# Patient Record
Sex: Female | Born: 1990 | Race: Black or African American | Hispanic: No | Marital: Single | Smoking: Never smoker
Health system: Southern US, Community
[De-identification: ages and names within clinical notes are randomized; demographics above are authoritative.]

## PROBLEM LIST (undated history)

## (undated) ENCOUNTER — Emergency Department: Discharge status: Absent without leave | Payer: Medicare Other

## (undated) DIAGNOSIS — I10 Essential (primary) hypertension: Secondary | ICD-10-CM

## (undated) DIAGNOSIS — Z992 Dependence on renal dialysis: Secondary | ICD-10-CM

## (undated) DIAGNOSIS — K219 Gastro-esophageal reflux disease without esophagitis: Secondary | ICD-10-CM

## (undated) DIAGNOSIS — N2581 Secondary hyperparathyroidism of renal origin: Secondary | ICD-10-CM

## (undated) DIAGNOSIS — I2699 Other pulmonary embolism without acute cor pulmonale: Secondary | ICD-10-CM

## (undated) DIAGNOSIS — N186 End stage renal disease: Secondary | ICD-10-CM

## (undated) DIAGNOSIS — N051 Unspecified nephritic syndrome with focal and segmental glomerular lesions: Secondary | ICD-10-CM

## (undated) DIAGNOSIS — E785 Hyperlipidemia, unspecified: Secondary | ICD-10-CM

## (undated) DIAGNOSIS — R6510 Systemic inflammatory response syndrome (SIRS) of non-infectious origin without acute organ dysfunction: Secondary | ICD-10-CM

## (undated) DIAGNOSIS — D649 Anemia, unspecified: Secondary | ICD-10-CM

## (undated) HISTORY — DX: Anemia, unspecified: D64.9

## (undated) HISTORY — PX: INSERTION OF DIALYSIS CATHETER: SHX1324

## (undated) HISTORY — PX: COLON SURGERY: SHX602

---
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
## 2015-02-19 ENCOUNTER — Encounter: Payer: Self-pay | Admitting: Vascular Surgery

## 2015-02-19 ENCOUNTER — Other Ambulatory Visit: Payer: Self-pay

## 2015-02-19 DIAGNOSIS — N186 End stage renal disease: Secondary | ICD-10-CM

## 2015-02-19 DIAGNOSIS — Z01810 Encounter for preprocedural cardiovascular examination: Secondary | ICD-10-CM

## 2015-03-13 ENCOUNTER — Encounter: Payer: Self-pay | Admitting: Vascular Surgery

## 2015-03-15 ENCOUNTER — Encounter (HOSPITAL_COMMUNITY): Payer: Self-pay

## 2015-03-15 ENCOUNTER — Other Ambulatory Visit (HOSPITAL_COMMUNITY): Payer: Self-pay

## 2015-03-15 ENCOUNTER — Ambulatory Visit: Payer: Self-pay | Admitting: Vascular Surgery

## 2015-03-19 ENCOUNTER — Encounter: Payer: Self-pay | Admitting: Vascular Surgery

## 2015-03-21 ENCOUNTER — Ambulatory Visit (HOSPITAL_COMMUNITY)
Admission: RE | Admit: 2015-03-21 | Discharge: 2015-03-21 | Disposition: A | Discharge status: Home / Self Care | Payer: Medicare Other | Source: Ambulatory Visit | Attending: Vascular Surgery | Admitting: Vascular Surgery

## 2015-03-21 ENCOUNTER — Ambulatory Visit (INDEPENDENT_AMBULATORY_CARE_PROVIDER_SITE_OTHER): Payer: Medicare Other | Admitting: Vascular Surgery

## 2015-03-21 ENCOUNTER — Encounter: Payer: Self-pay | Admitting: Vascular Surgery

## 2015-03-21 ENCOUNTER — Ambulatory Visit (INDEPENDENT_AMBULATORY_CARE_PROVIDER_SITE_OTHER)
Admission: RE | Admit: 2015-03-21 | Discharge: 2015-03-21 | Disposition: A | Discharge status: Home / Self Care | Payer: Medicare Other | Source: Ambulatory Visit | Attending: Vascular Surgery | Admitting: Vascular Surgery

## 2015-03-21 VITALS — BP 100/71 | HR 72 | Temp 98.1°F | Resp 16 | Ht 62.0 in | Wt 107.0 lb

## 2015-03-21 DIAGNOSIS — N186 End stage renal disease: Secondary | ICD-10-CM | POA: Diagnosis not present

## 2015-03-21 DIAGNOSIS — Z01810 Encounter for preprocedural cardiovascular examination: Secondary | ICD-10-CM

## 2015-03-21 DIAGNOSIS — Z992 Dependence on renal dialysis: Secondary | ICD-10-CM

## 2015-03-21 NOTE — Progress Notes (Signed)
[facility] [place] HISTORY AND PHYSICAL   History of Present Illness:  Patient is a 24 y.o. year old female who presents for placement of a permanent hemodialysis access. The patient is left handed.  The patient is currently on hemodialysis. She has been on dialysis since age 72. She apparently had some type of attempt to the fistula in the left arm in the past but this failed. 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  Other chronic medical problems include anemia which is stable. She currently dialyzes via a left-sided catheter. She states she has also had a previous left arm DVT. She is on Coumadin chronically for this.  Past Medical History  Diagnosis Date  . Anemia   . Chronic kidney disease Feb. 12, 2010    First date of Dialysis     Social History Social History  Substance Use Topics  . Smoking status: Never Smoker   . Smokeless tobacco: Never Used  . Alcohol Use: No    Family History History reviewed. No pertinent family history.  Allergies  Allergies  Allergen Reactions  . Cefazolin   . Cyclosporine   . Lactose Intolerance (Gi)   . Latex   . Vancomycin      Current Outpatient Prescriptions  Medication Sig Dispense Refill  . calcium acetate (PHOSLO) 667 MG capsule Take by mouth 3 (three) times daily with meals.    . calcium carbonate (TUMS - DOSED IN MG ELEMENTAL CALCIUM) 500 MG chewable tablet Chew 1 tablet by mouth daily.    . carvedilol (COREG) 25 MG tablet Take 25 mg by mouth 2 (two) times daily with a meal.    . cloNIDine HCl (KAPVAY) 0.1 MG TB12 ER tablet Take by mouth.    . folic acid (FOLVITE) 1 MG tablet Take 1 mg by mouth daily.    . multivitamin (RENA-VIT) TABS tablet Take 1 tablet by mouth daily.  11  . ranitidine (ZANTAC) 300 MG capsule Take 300 mg by mouth every evening.    . rosuvastatin (CRESTOR) 10 MG tablet Take 10 mg by mouth daily.    Marland Kitchen warfarin (COUMADIN) 5 MG tablet Take 5 mg by mouth  daily.     No current facility-administered medications for this visit.    ROS:   General:  No weight loss, Fever, chills  HEENT: No recent headaches, no nasal bleeding, no visual changes, no sore throat  Neurologic: No dizziness, blackouts, seizures. No recent symptoms of stroke or mini- stroke. No recent episodes of slurred speech, or temporary blindness.  Cardiac: No recent episodes of chest pain/pressure, no shortness of breath at rest.  No shortness of breath with exertion.  Denies history of atrial fibrillation or irregular heartbeat  Vascular: No history of rest pain in feet.  No history of claudication.  No history of non-healing ulcer, No history of DVT   Pulmonary: No home oxygen, no productive cough, no hemoptysis,  No asthma or wheezing  Musculoskeletal:  [ ]  Arthritis, [ ]  Low back pain,  [ ]  Joint pain  Hematologic:No history of hypercoagulable state.  No history of easy bleeding.  No history of anemia  Gastrointestinal: No hematochezia or melena,  No gastroesophageal reflux, no trouble swallowing  Urinary: [ ]  chronic Kidney disease, [ ]  on HD - [ ]  MWF or [ ]  TTHS, [ ]  Burning with urination, [ ]  Frequent urination, [ ]  Difficulty urinating;   Skin: No rashes  Psychological: No history of  anxiety,  No history of depression   Physical Examination  Filed Vitals:   03/21/15 1138  BP: 100/71  Pulse: 72  Temp: 98.1 F (36.7 C)  TempSrc: Oral  Resp: 16  Height: 5\' 2"  (1.575 m)  Weight: 107 lb (48.535 kg)  SpO2: 100%    Body mass index is 19.57 kg/(m^2).  General:  Alert and oriented, no acute distress HEENT: Normal Neck: No bruit or JVD Pulmonary: Clear to auscultation bilaterally Cardiac: Regular Rate and Rhythm without murmur Gastrointestinal: Soft, non-tender, non-distended Skin: No rash Extremity Pulses:  2+ radial, brachial pulses bilaterally Musculoskeletal: No deformity or edema  Neurologic: Upper and lower extremity motor 5/5 and  symmetric  DATA: Patient had a vein mapping ultrasound today as well as an upper extremity arterial duplex. This showed and unusable cephalic vein bilaterally. She had a borderline basilic vein on the right side. This was 3 mm in diameter but fairly short in length. The basilic vein on the left side was fairly uniformly small. 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   ASSESSMENT: Patient needs long-term hemodialysis access. The best vein is in her right upper extremity. She has also had a previous DVT in the left arm so most likely we should avoid this. Her brachial artery is fairly small bilaterally also.   PLAN: Most likely the best option for this patient would be a right upper arm AV graft. However, she is reluctant to have a permanent access placed at this time. I discussed with her the potential risks and complications and infection associated with catheters. She also understands that this may exclude her from transplant she develops an infection. She will call me if she was to have a right upper arm graft placed at some point future.  Ruta Hinds, MD [facility] Office: [phone] Pager: [phone]

## 2015-04-03 ENCOUNTER — Encounter: Payer: Self-pay | Admitting: Nephrology

## 2015-08-09 ENCOUNTER — Inpatient Hospital Stay (HOSPITAL_BASED_OUTPATIENT_CLINIC_OR_DEPARTMENT_OTHER): Payer: Medicare Other

## 2015-08-09 ENCOUNTER — Encounter (HOSPITAL_COMMUNITY): Payer: Self-pay | Admitting: Pulmonary Disease

## 2015-08-09 ENCOUNTER — Inpatient Hospital Stay (HOSPITAL_BASED_OUTPATIENT_CLINIC_OR_DEPARTMENT_OTHER)
Admission: EM | Admit: 2015-08-09 | Discharge: 2015-08-15 | DRG: 915 | Disposition: A | Discharge status: Home / Self Care | Payer: Medicare Other | Attending: Internal Medicine | Admitting: Internal Medicine

## 2015-08-09 DIAGNOSIS — T783XXA Angioneurotic edema, initial encounter: Secondary | ICD-10-CM | POA: Diagnosis not present

## 2015-08-09 DIAGNOSIS — Z681 Body mass index (BMI) 19 or less, adult: Secondary | ICD-10-CM | POA: Diagnosis not present

## 2015-08-09 DIAGNOSIS — I120 Hypertensive chronic kidney disease with stage 5 chronic kidney disease or end stage renal disease: Secondary | ICD-10-CM | POA: Diagnosis present

## 2015-08-09 DIAGNOSIS — R0600 Dyspnea, unspecified: Secondary | ICD-10-CM

## 2015-08-09 DIAGNOSIS — R739 Hyperglycemia, unspecified: Secondary | ICD-10-CM | POA: Diagnosis present

## 2015-08-09 DIAGNOSIS — D631 Anemia in chronic kidney disease: Secondary | ICD-10-CM | POA: Diagnosis present

## 2015-08-09 DIAGNOSIS — Z86711 Personal history of pulmonary embolism: Secondary | ICD-10-CM

## 2015-08-09 DIAGNOSIS — T782XXD Anaphylactic shock, unspecified, subsequent encounter: Secondary | ICD-10-CM | POA: Diagnosis not present

## 2015-08-09 DIAGNOSIS — T783XXD Angioneurotic edema, subsequent encounter: Secondary | ICD-10-CM | POA: Diagnosis not present

## 2015-08-09 DIAGNOSIS — T454X5A Adverse effect of iron and its compounds, initial encounter: Secondary | ICD-10-CM | POA: Diagnosis present

## 2015-08-09 DIAGNOSIS — T8612 Kidney transplant failure: Secondary | ICD-10-CM | POA: Diagnosis present

## 2015-08-09 DIAGNOSIS — T8089XA Other complications following infusion, transfusion and therapeutic injection, initial encounter: Secondary | ICD-10-CM | POA: Diagnosis present

## 2015-08-09 DIAGNOSIS — D509 Iron deficiency anemia, unspecified: Secondary | ICD-10-CM | POA: Diagnosis present

## 2015-08-09 DIAGNOSIS — T380X5A Adverse effect of glucocorticoids and synthetic analogues, initial encounter: Secondary | ICD-10-CM | POA: Diagnosis present

## 2015-08-09 DIAGNOSIS — N2581 Secondary hyperparathyroidism of renal origin: Secondary | ICD-10-CM | POA: Diagnosis present

## 2015-08-09 DIAGNOSIS — E785 Hyperlipidemia, unspecified: Secondary | ICD-10-CM | POA: Diagnosis present

## 2015-08-09 DIAGNOSIS — N186 End stage renal disease: Secondary | ICD-10-CM | POA: Diagnosis not present

## 2015-08-09 DIAGNOSIS — E875 Hyperkalemia: Secondary | ICD-10-CM | POA: Diagnosis present

## 2015-08-09 DIAGNOSIS — Y848 Other medical procedures as the cause of abnormal reaction of the patient, or of later complication, without mention of misadventure at the time of the procedure: Secondary | ICD-10-CM | POA: Diagnosis present

## 2015-08-09 DIAGNOSIS — T7840XA Allergy, unspecified, initial encounter: Secondary | ICD-10-CM | POA: Insufficient documentation

## 2015-08-09 DIAGNOSIS — G9340 Encephalopathy, unspecified: Secondary | ICD-10-CM | POA: Diagnosis present

## 2015-08-09 DIAGNOSIS — K219 Gastro-esophageal reflux disease without esophagitis: Secondary | ICD-10-CM | POA: Diagnosis present

## 2015-08-09 DIAGNOSIS — I10 Essential (primary) hypertension: Secondary | ICD-10-CM | POA: Diagnosis not present

## 2015-08-09 DIAGNOSIS — T886XXA Anaphylactic reaction due to adverse effect of correct drug or medicament properly administered, initial encounter: Principal | ICD-10-CM | POA: Diagnosis present

## 2015-08-09 DIAGNOSIS — J9601 Acute respiratory failure with hypoxia: Secondary | ICD-10-CM | POA: Diagnosis present

## 2015-08-09 DIAGNOSIS — Z7901 Long term (current) use of anticoagulants: Secondary | ICD-10-CM

## 2015-08-09 DIAGNOSIS — T782XXA Anaphylactic shock, unspecified, initial encounter: Secondary | ICD-10-CM | POA: Diagnosis present

## 2015-08-09 DIAGNOSIS — J9690 Respiratory failure, unspecified, unspecified whether with hypoxia or hypercapnia: Secondary | ICD-10-CM

## 2015-08-09 DIAGNOSIS — Z992 Dependence on renal dialysis: Secondary | ICD-10-CM

## 2015-08-09 HISTORY — DX: Other pulmonary embolism without acute cor pulmonale: I26.99

## 2015-08-09 HISTORY — DX: Gastro-esophageal reflux disease without esophagitis: K21.9

## 2015-08-09 HISTORY — DX: Secondary hyperparathyroidism of renal origin: N25.81

## 2015-08-09 HISTORY — DX: Unspecified nephritic syndrome with focal and segmental glomerular lesions: N05.1

## 2015-08-09 HISTORY — DX: Hyperlipidemia, unspecified: E78.5

## 2015-08-09 HISTORY — DX: Essential (primary) hypertension: I10

## 2015-08-09 LAB — COMPREHENSIVE METABOLIC PANEL
ALT: 23 U/L (ref 14–54)
AST: 30 U/L (ref 15–41)
Albumin: 4.3 g/dL (ref 3.5–5.0)
Alkaline Phosphatase: 127 U/L — ABNORMAL HIGH (ref 38–126)
Anion gap: 14 (ref 5–15)
BILIRUBIN TOTAL: 0.5 mg/dL (ref 0.3–1.2)
BUN: 21 mg/dL — AB (ref 6–20)
CALCIUM: 7.9 mg/dL — AB (ref 8.9–10.3)
CO2: 24 mmol/L (ref 22–32)
CREATININE: 6.07 mg/dL — AB (ref 0.44–1.00)
Chloride: 103 mmol/L (ref 101–111)
GFR calc Af Amer: 10 mL/min — ABNORMAL LOW (ref >60)
GFR, EST NON AFRICAN AMERICAN: 9 mL/min — AB (ref >60)
Glucose, Bld: 189 mg/dL — ABNORMAL HIGH (ref 65–99)
Potassium: 3.4 mmol/L — ABNORMAL LOW (ref 3.5–5.1)
Sodium: 141 mmol/L (ref 135–145)
TOTAL PROTEIN: 8.3 g/dL — AB (ref 6.5–8.1)

## 2015-08-09 LAB — POCT I-STAT 3, ART BLOOD GAS (G3+)
Acid-base deficit: 5 mmol/L — ABNORMAL HIGH (ref 0.0–2.0)
Bicarbonate: 21.2 mEq/L (ref 20.0–24.0)
O2 Saturation: 100 %
PCO2 ART: 41.8 mmHg (ref 35.0–45.0)
PH ART: 7.312 — AB (ref 7.350–7.450)
Patient temperature: 98.6
TCO2: 22 mmol/L (ref 0–100)
pO2, Arterial: 284 mmHg — ABNORMAL HIGH (ref 80.0–100.0)

## 2015-08-09 LAB — GLUCOSE, CAPILLARY
GLUCOSE-CAPILLARY: 221 mg/dL — AB (ref 65–99)
GLUCOSE-CAPILLARY: 176 mg/dL — AB (ref 65–99)
Glucose-Capillary: 111 mg/dL — ABNORMAL HIGH (ref 65–99)

## 2015-08-09 LAB — CBC WITH DIFFERENTIAL/PLATELET
BASOS ABS: 0 10*3/uL (ref 0.0–0.1)
BASOS PCT: 0 %
EOS ABS: 0.1 10*3/uL (ref 0.0–0.7)
EOS PCT: 1 %
HCT: 39.2 % (ref 36.0–46.0)
Hemoglobin: 12.2 g/dL (ref 12.0–15.0)
Lymphocytes Relative: 11 %
Lymphs Abs: 0.9 10*3/uL (ref 0.7–4.0)
MCH: 32.2 pg (ref 26.0–34.0)
MCHC: 31.1 g/dL (ref 30.0–36.0)
MCV: 103.4 fL — AB (ref 78.0–100.0)
MONO ABS: 0.2 10*3/uL (ref 0.1–1.0)
Monocytes Relative: 2 %
Neutro Abs: 7.1 10*3/uL (ref 1.7–7.7)
Neutrophils Relative %: 86 %
PLATELETS: 204 10*3/uL (ref 150–400)
RBC: 3.79 MIL/uL — ABNORMAL LOW (ref 3.87–5.11)
RDW: 16.1 % — AB (ref 11.5–15.5)
WBC: 8.3 10*3/uL (ref 4.0–10.5)

## 2015-08-09 LAB — MAGNESIUM: MAGNESIUM: 1.9 mg/dL (ref 1.7–2.4)

## 2015-08-09 LAB — PROTIME-INR
INR: 1.28 (ref 0.00–1.49)
Prothrombin Time: 16.2 seconds — ABNORMAL HIGH (ref 11.6–15.2)

## 2015-08-09 LAB — MRSA PCR SCREENING: MRSA by PCR: NEGATIVE

## 2015-08-09 MED ORDER — EPINEPHRINE HCL 1 MG/ML IJ SOLN
INTRAMUSCULAR | Status: AC
  Filled 2015-08-09: qty 1

## 2015-08-09 MED ORDER — EPINEPHRINE 0.3 MG/0.3ML IJ SOAJ: 0.3000 mg | Freq: Once | INTRAMUSCULAR | Status: DC

## 2015-08-09 MED ORDER — PROPOFOL 1000 MG/100ML IV EMUL
INTRAVENOUS | Status: AC
  Filled 2015-08-09: qty 100

## 2015-08-09 MED ORDER — DIPHENHYDRAMINE HCL 50 MG/ML IJ SOLN
50.0000 mg | Freq: Once | INTRAMUSCULAR | Status: AC
  Administered 2015-08-09: 50 mg via INTRAVENOUS

## 2015-08-09 MED ORDER — ETOMIDATE 2 MG/ML IV SOLN
20.0000 mg | Freq: Once | INTRAVENOUS | Status: AC
  Administered 2015-08-09: 20 mg via INTRAVENOUS

## 2015-08-09 MED ORDER — EPINEPHRINE HCL 0.1 MG/ML IJ SOSY: 0.3000 mg | PREFILLED_SYRINGE | Freq: Once | INTRAMUSCULAR | Status: DC

## 2015-08-09 MED ORDER — PROPOFOL BOLUS VIA INFUSION
1.0000 mg/kg | Freq: Once | INTRAVENOUS | Status: AC
  Administered 2015-08-09: 42.2 mg via INTRAVENOUS
  Filled 2015-08-09: qty 43

## 2015-08-09 MED ORDER — HEPARIN SODIUM (PORCINE) 1000 UNIT/ML IJ SOLN
4000.0000 [IU] | Freq: Once | INTRAMUSCULAR | Status: AC
  Administered 2015-08-09: 4000 [IU] via INTRAVENOUS
  Filled 2015-08-09: qty 4

## 2015-08-09 MED ORDER — PROPOFOL 1000 MG/100ML IV EMUL
5.0000 ug/kg/min | Freq: Once | INTRAVENOUS | Status: AC
  Administered 2015-08-09: 5 ug/kg/min via INTRAVENOUS

## 2015-08-09 MED ORDER — ROCURONIUM BROMIDE 50 MG/5ML IV SOLN
0.6000 mg/kg | Freq: Once | INTRAVENOUS | Status: AC
  Administered 2015-08-09: 25.3 mg via INTRAVENOUS

## 2015-08-09 MED ORDER — FENTANYL CITRATE (PF) 100 MCG/2ML IJ SOLN
INTRAMUSCULAR | Status: AC
  Administered 2015-08-09: 50 ug
  Filled 2015-08-09: qty 2

## 2015-08-09 MED ORDER — WARFARIN - PHARMACIST DOSING INPATIENT
Freq: Every day | Status: DC
  Administered 2015-08-09 – 2015-08-13 (×3)

## 2015-08-09 MED ORDER — FAMOTIDINE IN NACL 20-0.9 MG/50ML-% IV SOLN
20.0000 mg | Freq: Once | INTRAVENOUS | Status: AC
  Administered 2015-08-09: 20 mg via INTRAVENOUS

## 2015-08-09 MED ORDER — FENTANYL CITRATE (PF) 100 MCG/2ML IJ SOLN
100.0000 ug | INTRAMUSCULAR | Status: DC | PRN
  Administered 2015-08-09 – 2015-08-10 (×6): 100 ug via INTRAVENOUS
  Administered 2015-08-10: 50 ug via INTRAVENOUS
  Administered 2015-08-10 (×4): 100 ug via INTRAVENOUS
  Administered 2015-08-11 (×3): 50 ug via INTRAVENOUS
  Administered 2015-08-11: 100 ug via INTRAVENOUS
  Administered 2015-08-11 (×2): 50 ug via INTRAVENOUS
  Administered 2015-08-12: 100 ug via INTRAVENOUS
  Filled 2015-08-09 (×16): qty 2

## 2015-08-09 MED ORDER — EPINEPHRINE HCL 1 MG/ML IJ SOLN
1.0000 ug/min | INTRAMUSCULAR | Status: DC
  Administered 2015-08-09: 1 ug/min via INTRAVENOUS

## 2015-08-09 MED ORDER — MIDAZOLAM HCL 2 MG/2ML IJ SOLN
2.0000 mg | INTRAMUSCULAR | Status: DC | PRN
  Administered 2015-08-09: 2 mg via INTRAVENOUS
  Filled 2015-08-09: qty 2

## 2015-08-09 MED ORDER — ETOMIDATE 2 MG/ML IV SOLN: 0.3000 mg/kg | Freq: Once | INTRAVENOUS | Status: DC

## 2015-08-09 MED ORDER — ANTISEPTIC ORAL RINSE SOLUTION (CORINZ)
7.0000 mL | OROMUCOSAL | Status: DC
  Administered 2015-08-09 – 2015-08-14 (×14): 7 mL via OROMUCOSAL

## 2015-08-09 MED ORDER — FENTANYL CITRATE (PF) 100 MCG/2ML IJ SOLN
INTRAMUSCULAR | Status: AC
  Administered 2015-08-09: 100 ug
  Filled 2015-08-09: qty 2

## 2015-08-09 MED ORDER — MIDAZOLAM HCL 2 MG/2ML IJ SOLN
2.0000 mg | INTRAMUSCULAR | Status: DC | PRN
  Administered 2015-08-09: 2 mg via INTRAVENOUS
  Filled 2015-08-09 (×2): qty 2

## 2015-08-09 MED ORDER — CHLORHEXIDINE GLUCONATE 0.12% ORAL RINSE (MEDLINE KIT)
15.0000 mL | Freq: Two times a day (BID) | OROMUCOSAL | Status: DC
  Administered 2015-08-09 – 2015-08-10 (×2): 15 mL via OROMUCOSAL

## 2015-08-09 MED ORDER — DIPHENHYDRAMINE HCL 25 MG PO CAPS: 50.0000 mg | ORAL_CAPSULE | Freq: Once | ORAL | Status: DC

## 2015-08-09 MED ORDER — EPINEPHRINE HCL 0.1 MG/ML IJ SOSY
0.3000 mg | PREFILLED_SYRINGE | Freq: Once | INTRAMUSCULAR | Status: AC
  Administered 2015-08-09: 0.3 mg via INTRAVENOUS

## 2015-08-09 MED ORDER — METHYLPREDNISOLONE SODIUM SUCC 40 MG IJ SOLR
40.0000 mg | Freq: Four times a day (QID) | INTRAMUSCULAR | Status: DC
  Administered 2015-08-09 – 2015-08-11 (×8): 40 mg via INTRAVENOUS
  Filled 2015-08-09 (×12): qty 1

## 2015-08-09 MED ORDER — SODIUM CHLORIDE 0.9 % IV SOLN: INTRAVENOUS | Status: DC

## 2015-08-09 MED ORDER — RACEPINEPHRINE HCL 2.25 % IN NEBU
INHALATION_SOLUTION | RESPIRATORY_TRACT | Status: AC
  Administered 2015-08-09: 11:00:00
  Filled 2015-08-09: qty 0.5

## 2015-08-09 MED ORDER — FAMOTIDINE IN NACL 20-0.9 MG/50ML-% IV SOLN
INTRAVENOUS | Status: AC
  Administered 2015-08-09: 20 mg via INTRAVENOUS
  Filled 2015-08-09: qty 50

## 2015-08-09 MED ORDER — ANTISEPTIC ORAL RINSE SOLUTION (CORINZ): 7.0000 mL | Freq: Four times a day (QID) | OROMUCOSAL | Status: DC

## 2015-08-09 MED ORDER — MIDAZOLAM HCL 10 MG/2ML IJ SOLN
INTRAMUSCULAR | Status: AC
  Administered 2015-08-09: 6 mg
  Filled 2015-08-09: qty 2

## 2015-08-09 MED ORDER — CHLORHEXIDINE GLUCONATE 0.12% ORAL RINSE (MEDLINE KIT): 15.0000 mL | Freq: Two times a day (BID) | OROMUCOSAL | Status: DC

## 2015-08-09 MED ORDER — METHYLPREDNISOLONE SODIUM SUCC 125 MG IJ SOLR
125.0000 mg | Freq: Once | INTRAMUSCULAR | Status: AC
  Administered 2015-08-09: 125 mg via INTRAVENOUS
  Filled 2015-08-09: qty 2

## 2015-08-09 MED ORDER — DIPHENHYDRAMINE HCL 50 MG/ML IJ SOLN
INTRAMUSCULAR | Status: AC
  Administered 2015-08-09: 50 mg via INTRAVENOUS
  Filled 2015-08-09: qty 1

## 2015-08-09 MED ORDER — WARFARIN SODIUM 7.5 MG PO TABS
7.5000 mg | ORAL_TABLET | Freq: Once | ORAL | Status: AC
  Administered 2015-08-09: 7.5 mg via ORAL
  Filled 2015-08-09: qty 1

## 2015-08-09 MED ORDER — FENTANYL CITRATE (PF) 100 MCG/2ML IJ SOLN
100.0000 ug | INTRAMUSCULAR | Status: DC | PRN
  Administered 2015-08-09: 100 ug via INTRAVENOUS
  Filled 2015-08-09: qty 2

## 2015-08-09 MED ORDER — INSULIN ASPART 100 UNIT/ML ~~LOC~~ SOLN
0.0000 [IU] | SUBCUTANEOUS | Status: DC
  Administered 2015-08-09: 2 [IU] via SUBCUTANEOUS

## 2015-08-09 MED ORDER — ACETAMINOPHEN 650 MG RE SUPP: 650.0000 mg | RECTAL | Status: DC | PRN

## 2015-08-09 MED ORDER — FAMOTIDINE IN NACL 20-0.9 MG/50ML-% IV SOLN
20.0000 mg | INTRAVENOUS | Status: DC
  Administered 2015-08-10 – 2015-08-13 (×4): 20 mg via INTRAVENOUS
  Filled 2015-08-09 (×8): qty 50

## 2015-08-09 MED ORDER — DIPHENHYDRAMINE HCL 50 MG/ML IJ SOLN
50.0000 mg | Freq: Four times a day (QID) | INTRAMUSCULAR | Status: DC
  Administered 2015-08-09 – 2015-08-10 (×4): 50 mg via INTRAVENOUS
  Filled 2015-08-09 (×8): qty 1

## 2015-08-09 MED ORDER — DEXTROSE 5 % IV SOLN
2.0000 ug/min | Freq: Once | INTRAVENOUS | Status: AC
  Administered 2015-08-09: 4 ug/min via INTRAVENOUS
  Filled 2015-08-09: qty 4

## 2015-08-09 MED ORDER — EPINEPHRINE HCL 1 MG/ML IJ SOLN
INTRAMUSCULAR | Status: AC
  Filled 2015-08-09: qty 4

## 2015-08-09 NOTE — Progress Notes (Signed)
ANTICOAGULATION CONSULT NOTE - Initial Consult  Pharmacy Consult for warfarin Indication: DVT  Allergies  Allergen Reactions  . Ferrlecit [Na Ferric Gluc Cplx In Sucrose] Anaphylaxis  . Cefazolin   . Cyclosporine     Loss of motor skills  . Lactose Intolerance (Gi)   . Latex   . Vancomycin Rash    Patient Measurements: Height: 5\' 3"  (160 cm) Weight: 110 lb 0.2 oz (49.9 kg) IBW/kg (Calculated) : 52.4   Vital Signs: Temp: 99.4 F (37.4 C) (02/10 1513) Temp Source: Oral (02/10 1513) BP: 107/87 mmHg (02/10 1602) Pulse Rate: 128 (02/10 1602)  Labs:  Recent Labs  08/09/15 1532  HGB 12.2  HCT 39.2  PLT 204  LABPROT 16.2*  INR 1.28  CREATININE 6.07*    Estimated Creatinine Clearance: 11.2 mL/min (by C-G formula based on Cr of 6.07).   Medical History: Past Medical History  Diagnosis Date  . Anemia   . ESRD (end stage renal disease) ([place]) Feb. 12, 2010    First date of Dialysis   . Secondary hyperparathyroidism ([place])   . Pulmonary emboli ([place])   . Hypertension   . GERD (gastroesophageal reflux disease)   . Hyperlipidemia   . Focal segmental glomerulosclerosis     Assessment: 67 YOF with ESRD on HD with history of DVT in UE. Intubated and unable to provide medication history at this time- mom stepped out of room prior to pharmacy technician arrived- we will continue to attempt to complete medication history. From our files and CareEverywhere, patient has been on warfarin since at least 02/2015. Her last documented dose of warfarin was 5mg  daily (from refill information from 07/11/2015 from [facility]). INR today low at 1.28. Hgb 12.2, plts 204.  Goal of Therapy:  INR 2-3 Monitor platelets by anticoagulation protocol: Yes   Plan:  -warfarin 7.5mg  po x1 tonight -daily INR -follow for s/s bleeding  Ja Pistole D. Naydeen Speirs, PharmD, BCPS Clinical Pharmacist Pager: [phone] 08/09/2015 5:02 PM

## 2015-08-09 NOTE — ED Notes (Signed)
Report given to 2100 rn, [facility] en route with patient to [facility]

## 2015-08-09 NOTE — ED Provider Notes (Signed)
CSN: [id number]     Arrival date & time 08/09/15  1110 History   First MD Initiated Contact with Patient 08/09/15 1212     Chief Complaint  Patient presents with  . Allergic Reaction     (Consider location/radiation/quality/duration/timing/severity/associated sxs/prior Treatment) HPI   Patient is a 25 year old female presenting in anaphylaxis. Patient Delivered here by EMS because she was too sick to travel any further. [facility] Patient had allergic rash/angioedema and starting at 1023 in the morning.  This is directly after receiving an iron infusion. Patient had an iron infusion on Wednesday and was very sick nausea vomiting. They tried again today and she became anaphylactic.  On arrival here patient's eyes were almost swollen shut, lips were swollen uvula swollen difficult to visualize back of her mouth. Patient was complaining of trouble breathing.   Patient had no itching.  Patient received Solu-Medrol prior to arrival  Past Medical History  Diagnosis Date  . Anemia   . Chronic kidney disease Feb. 12, 2010    First date of Dialysis    No past surgical history on file. No family history on file. Social History  Substance Use Topics  . Smoking status: Never Smoker   . Smokeless tobacco: Never Used  . Alcohol Use: No   OB History    No data available     Review of Systems  Unable to perform ROS: Acuity of condition  Respiratory: Positive for chest tightness.       Allergies  Cefazolin; Cyclosporine; Lactose intolerance (gi); Latex; and Vancomycin  Home Medications   Prior to Admission medications   Medication Sig Start Date End Date Taking? Authorizing Provider  calcium acetate (PHOSLO) 667 MG capsule Take by mouth 3 (three) times daily with meals.    Historical Provider, MD  calcium carbonate (TUMS - DOSED IN MG ELEMENTAL CALCIUM) 500 MG chewable tablet Chew 1 tablet by mouth daily.    Historical Provider, MD   carvedilol (COREG) 25 MG tablet Take 25 mg by mouth 2 (two) times daily with a meal.    Historical Provider, MD  cloNIDine HCl (KAPVAY) 0.1 MG TB12 ER tablet Take by mouth.    Historical Provider, MD  folic acid (FOLVITE) 1 MG tablet Take 1 mg by mouth daily.    Historical Provider, MD  multivitamin (RENA-VIT) TABS tablet Take 1 tablet by mouth daily. 02/12/15   Historical Provider, MD  ranitidine (ZANTAC) 300 MG capsule Take 300 mg by mouth every evening.    Historical Provider, MD  rosuvastatin (CRESTOR) 10 MG tablet Take 10 mg by mouth daily.    Historical Provider, MD  warfarin (COUMADIN) 5 MG tablet Take 5 mg by mouth daily.    Historical Provider, MD   BP 104/68 mmHg  Pulse 111  Resp 18  Wt 93 lb (42.185 kg)  SpO2 100% Physical Exam  Constitutional: She appears well-developed and well-nourished.  Patient with swollen face, swollen lips.  HENT:  Head: Normocephalic and atraumatic.  Uvula swollen. Soft palate swollen. Edematous. Throughout.  Upper and lower lips swollen.  Eyes: Conjunctivae are normal. Right eye exhibits no discharge.  Neck: Neck supple.  Cardiovascular:  Tachycardic.  Pulmonary/Chest: Effort normal. She has wheezes.  Port in left upper chest wall.  Abdominal: Soft. She exhibits no distension. There is no tenderness.  Old scarring present on abdomen.  Neurological: No cranial nerve deficit.  Skin: Skin is warm and dry. No rash noted. She is not diaphoretic.  No rash noted.  Nursing note and vitals reviewed.   ED Course  Procedures (including critical care time) Labs Review Labs Reviewed - No data to display  Imaging Review Dg Chest Portable 1 View  08/09/2015  CLINICAL DATA:  Nonresponsive Chronic kidney failure. EXAM: PORTABLE CHEST 1 VIEW COMPARISON:  None. FINDINGS: Nasogastric tube with the tip projecting over the body of the stomach. Dual-lumen central venous catheter with the tip projecting over the cavoatrial junction. Endotracheal tube with the  tip 2.9 cm above the carina. There is no focal parenchymal opacity. There is no pleural effusion or pneumothorax. The heart and mediastinal contours are unremarkable. The osseous structures are unremarkable. IMPRESSION: Nasogastric tube with the tip projecting over the body of the stomach. Dual-lumen central venous catheter with the tip projecting over the cavoatrial junction. Endotracheal tube with the tip 2.9 cm above the carina. Electronically Signed   By: Kathreen Devoid   On: 08/09/2015 12:40   I have personally reviewed and evaluated these images and lab results as part of my medical decision-making.   EKG Interpretation None      MDM   Final diagnoses:  Allergic reaction, initial encounter    Patient is a 25 year old female presenting with facial swelling, thought to be anaphylactic reaction to an iron infusion at dialysis. Patient arrived here at 11 AM. Gave her dose of epinephrine. Redosed Solu-Medrol (they were unsure whether it was actually given), gave famotidine. Initially thought she was improving. The patient complained of increasing trouble breathing. Initially when he looked in the back of her throat she had swollen uvula uvula that was separate from soft palate.  At this point medical care was called. Transport was called.  When patient became worse she was breathing through swollen lips, was completely closed, uvula was on different trouble from the last of the soft palate. Patient became stridorous. She became panicked.  At that point intubation was set up. Crit kit was ready. Decision made to do non-paralytic intubation given the difficulty anticipated of the airway. Initially tried just midazolam. This wasn't effective. Etomidate given. 0.5 cm of airway seen with glycdescopee. Unable to pass tube because of angle. Patient dropped sats, bagged up again and an Additional effort made with a glydescope again. Unable now to visualize any of the cords. Bulbous edematous ariway seen.  Patient had completely edematous swollen airway, unable to see any portion of the airway. Direct laryngoscopy used and tube placed underneath swelling that was overlying airway angled up until breath sounds are heard through tube.  Positive color change. Bilateral breath sounds. Patient was intubated with a 6-0 tube.  Proprofol bolus given so airway could be maintained.   Pressure dropped to the mid 95K systolic. Prop stopped.  Epinephrine increase. Levophed started for transport.   Discussed with crit care.   CRITICAL CARE Performed by: Gardiner Sleeper Total critical care time: 90 minutes Critical care time was exclusive of separately billable procedures and treating other patients. Critical care was necessary to treat or prevent imminent or life-threatening deterioration. Critical care was time spent personally by me on the following activities: development of treatment plan with patient and/or surrogate as well as nursing, discussions with consultants, evaluation of patient's response to treatment, examination of patient, obtaining history from patient or surrogate, ordering and performing treatments and interventions, ordering and review of laboratory studies, ordering and review of radiographic studies, pulse oximetry and re-evaluation of patient's condition.     Yenty Bloch Julio Alm, MD 08/09/15 1320

## 2015-08-09 NOTE — ED Notes (Signed)
Pt at dialysis started having reaction at 1043, pt reacted to iron after 2nd dose of adminstered .3mg  given albuteral neb, consious alert and oriented during stragte

## 2015-08-09 NOTE — Progress Notes (Signed)
Warren Lacy MD notified of lab results.

## 2015-08-09 NOTE — Progress Notes (Signed)
UR Completed. Naziya Hegwood, RN, BSN.  [phone] 

## 2015-08-09 NOTE — Care Management Note (Signed)
Case Management Note  Patient Details  Name: Crystal Dunlap MRN: [id number] Date of Birth: [birth year]  Subjective/Objective:      Pt admitted with anaphylactic shock during HD treatment              Action/Plan:  Pt is independent from home with mom.   CM will continue to monitor   Expected Discharge Date:                  Expected Discharge Plan:  Home/Self Care  In-House Referral:     Discharge planning Services  CM Consult  Post Acute Care Choice:    Choice offered to:     DME Arranged:    DME Agency:     HH Arranged:    HH Agency:     Status of Service:  In process, will continue to follow  Medicare Important Message Given:    Date Medicare IM Given:    Medicare IM give by:    Date Additional Medicare IM Given:    Additional Medicare Important Message give by:     If discussed at [place] of Stay Meetings, dates discussed:    Additional Comments:  Maryclare Labrador, RN 08/09/2015, 2:31 PM

## 2015-08-09 NOTE — Progress Notes (Signed)
Received pt to [facility] from [facility] as a transfer from [facility]. Pt placed on vent with no complications. Pt stable at this time. RT will continue to monitor.

## 2015-08-09 NOTE — Progress Notes (Signed)
eLink Physician-Brief Progress Note Patient Name: Crystal Dunlap DOB: 12/20/1990 MRN: [id number]   Date of Service  08/09/2015  HPI/Events of Note  K+ = 3.4 and Creatinine = 6.07.   eICU Interventions  Will not replace K+ d/t renal function.      Intervention Category Major Interventions: Electrolyte abnormality - evaluation and management  [name] [place] 08/09/2015, 6:03 PM

## 2015-08-09 NOTE — ED Notes (Signed)
Critical care called --via carelink

## 2015-08-09 NOTE — H&P (Signed)
PULMONARY / CRITICAL CARE MEDICINE   Name: Crystal Dunlap MRN: [id number] DOB: 1991-02-27    ADMISSION DATE:  08/09/2015  REFERRING MD:  [facility]  CHIEF COMPLAINT:  Short of breath  HISTORY OF PRESENT ILLNESS:   25 yo female with hx of ESRD and iron deficiency anemia was given ferrlecit on 08/07/15.  Pt's mother reports that pt c/o swelling and feeling like throat was closing after HD on 2/08.  This resolved, and she did not have further complaints.  She had HD again on 2/10.  Mother thinks she completed HD.  She was given another dose of ferrlecit, and almost immediately developed tongue/lip swelling.  She was transferred to [facility].  She was intubated, and started on epinephrine gtt.  She was also given solumedrol, pepcid, She was transferred to [facility].  Her blood pressure was better, and she was weaned off epinephrine.  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  She is normally followed at [facility], and gets HD M/W/F.  PAST MEDICAL HISTORY :  She  has a past medical history of Anemia; ESRD (end stage renal disease) ([place]) (Feb. 12, 2010); Secondary hyperparathyroidism ([place]); Pulmonary emboli ([place]); Hypertension; GERD (gastroesophageal reflux disease); Hyperlipidemia; and Focal segmental glomerulosclerosis.  PAST SURGICAL HISTORY: She  has past surgical history that includes Kidney transplant.  Allergies  Allergen Reactions  . Ferrlecit [Na Ferric Gluc Cplx In Sucrose] Anaphylaxis  . Cefazolin   . Cyclosporine   . Lactose Intolerance (Gi)   . Latex   . Vancomycin     No current facility-administered medications on file prior to encounter.   Current Outpatient Prescriptions on File Prior to Encounter  Medication Sig  . calcium acetate (PHOSLO) 667 MG capsule Take by mouth 3 (three) times daily with meals.  . calcium carbonate (TUMS - DOSED IN MG ELEMENTAL CALCIUM) 500 MG  chewable tablet Chew 1 tablet by mouth daily.  . carvedilol (COREG) 25 MG tablet Take 25 mg by mouth 2 (two) times daily with a meal.  . cloNIDine HCl (KAPVAY) 0.1 MG TB12 ER tablet Take by mouth.  . folic acid (FOLVITE) 1 MG tablet Take 1 mg by mouth daily.  . multivitamin (RENA-VIT) TABS tablet Take 1 tablet by mouth daily.  . ranitidine (ZANTAC) 300 MG capsule Take 300 mg by mouth every evening.  . rosuvastatin (CRESTOR) 10 MG tablet Take 10 mg by mouth daily.  Marland Kitchen warfarin (COUMADIN) 5 MG tablet Take 5 mg by mouth daily.    FAMILY HISTORY:  Her indicated that her mother is alive. She indicated that her father is alive.   SOCIAL HISTORY: She  reports that she has never smoked. She has never used smokeless tobacco. She reports that she does not drink alcohol or use illicit drugs.  REVIEW OF SYSTEMS:   Unable to obtain due to pt being on vent and sedated.  SUBJECTIVE:  On vent  VITAL SIGNS: BP 128/93 mmHg  Pulse 101  Temp(Src) 97.9 F (36.6 C) (Oral)  Resp 21  Ht 5\' 3"  (1.6 m)  Wt 110 lb 0.2 oz (49.9 kg)  BMI 19.49 kg/m2  SpO2 100%  HEMODYNAMICS:    VENTILATOR SETTINGS: Vent Mode:  [-] PRVC FiO2 (%):  [100 %] 100 % Set Rate:  [18 bmp] 18 bmp Vt Set:  [360 mL] 360 mL PEEP:  [5 cmH20] 5 cmH20 Plateau Pressure:  [17 [id number]  cmH20] 17 cmH20  INTAKE / OUTPUT:    PHYSICAL EXAMINATION: General:  sedated Neuro:  RASS -3 HEENT:  ETT in place, swollen tongue/lips Cardiovascular:  Regular, tachycardic, HD cath in Lt upper chest Lungs: no wheeze Abdomen:  abd soft, non tender Musculoskeletal:  No edema Skin:  No hives  LABS:  BMET No results for input(s): NA, K, CL, CO2, BUN, CREATININE, GLUCOSE in the last 168 hours.  Electrolytes No results for input(s): CALCIUM, MG, PHOS in the last 168 hours.  CBC No results for input(s): WBC, HGB, HCT, PLT in the last 168 hours.  Coag's No results for input(s): APTT, INR in the last 168 hours.  Sepsis Markers No  results for input(s): LATICACIDVEN, PROCALCITON, O2SATVEN in the last 168 hours.  ABG  Recent Labs Lab 08/09/15 1356  PHART 7.312*  PCO2ART 41.8  PO2ART 284.0*    Liver Enzymes No results for input(s): AST, ALT, ALKPHOS, BILITOT, ALBUMIN in the last 168 hours.  Cardiac Enzymes No results for input(s): TROPONINI, PROBNP in the last 168 hours.  Glucose  Recent Labs Lab 08/09/15 1347  GLUCAP 221*    Imaging Dg Chest Portable 1 View  08/09/2015  CLINICAL DATA:  Nonresponsive Chronic kidney failure. EXAM: PORTABLE CHEST 1 VIEW COMPARISON:  None. FINDINGS: Nasogastric tube with the tip projecting over the body of the stomach. Dual-lumen central venous catheter with the tip projecting over the cavoatrial junction. Endotracheal tube with the tip 2.9 cm above the carina. There is no focal parenchymal opacity. There is no pleural effusion or pneumothorax. The heart and mediastinal contours are unremarkable. The osseous structures are unremarkable. IMPRESSION: Nasogastric tube with the tip projecting over the body of the stomach. Dual-lumen central venous catheter with the tip projecting over the cavoatrial junction. Endotracheal tube with the tip 2.9 cm above the carina. Electronically Signed   By: Kathreen Devoid   On: 08/09/2015 12:40     STUDIES:   CULTURES:  ANTIBIOTICS:  SIGNIFICANT EVENTS: 2/10 Admit  LINES/TUBES: 2/10 ETT >>  DISCUSSION: 25 yo female developed anaphylaxis with angioedema after receiving ferrlecit during HD for iron deficiency anemia.  She transiently required pressors, and required intubation.  ASSESSMENT / PLAN:  PULMONARY A: Acute hypoxic respiratory failure 2nd to anaphylaxis with angioedema. P:   Full vent support for now F/u CXR, ABG Solumedrol, pepcid, benadryl  CARDIOVASCULAR A:  Anaphylaxis >> weaned off pressors. Hx of HTN, HLD. 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 P:  IV fluid at 30 ml/hr Hold outpt coreg, clonidine, crestor Coumadin per  pharmacy  RENAL A:   Hx of focal segmental glomerulosclerosis with ESRD s/p failed renal transplant >> on HD M/W/F with [facility] outpt HD. P:   F/u BMET, electrolytes Might need to consult renal over weekend if she is not able to get extubated soon  GASTROINTESTINAL A:   Nutrition. P:   NPO >> tube feeds if not able to extubate soon Pepcid for SUP  HEMATOLOGIC A:   Iron deficiency anemia. P:  F/u CBC SCDs  INFECTIOUS A:   No evidence for infection. P:   Monitor clinically  ENDOCRINE A:   Steroid induced hyperglycemia.   P:   SSI while on steroids  NEUROLOGIC A:   Acute encephalopathy 2nd to anaphylaxis. P:   RASS goal: 0  Updated pt's mother at bedside.  CC time 38 minutes.  Chesley Mires, MD [facility] Pulmonary/Critical Care 08/09/2015, 3:06 PM Pager:  [phone] After 3pm call: [phone]

## 2015-08-09 NOTE — ED Notes (Signed)
Spoke with [place] [phone]), Report that pt received first dose Ferrlecit (Iron) on Wednesday. Later that evening at home after dialysis she felt nauseated and had low b/p. Received second dose Ferrlecit today (Friday) at 0851. Pt c/o throat swelling at 1020. At 1030 she received benadryl 25 mg IV, solumedrol 125 mg IV, and 0.3mg  epi SQ prior to EMS arrival.

## 2015-08-09 NOTE — ED Notes (Signed)
Portable xray--Stat

## 2015-08-10 ENCOUNTER — Inpatient Hospital Stay (HOSPITAL_COMMUNITY): Payer: Medicare Other

## 2015-08-10 DIAGNOSIS — J9600 Acute respiratory failure, unspecified whether with hypoxia or hypercapnia: Secondary | ICD-10-CM

## 2015-08-10 DIAGNOSIS — J9690 Respiratory failure, unspecified, unspecified whether with hypoxia or hypercapnia: Secondary | ICD-10-CM | POA: Insufficient documentation

## 2015-08-10 LAB — CBC
HEMATOCRIT: 33.8 % — AB (ref 36.0–46.0)
Hemoglobin: 10.8 g/dL — ABNORMAL LOW (ref 12.0–15.0)
MCH: 33.4 pg (ref 26.0–34.0)
MCHC: 32 g/dL (ref 30.0–36.0)
MCV: 104.6 fL — AB (ref 78.0–100.0)
PLATELETS: 185 10*3/uL (ref 150–400)
RBC: 3.23 MIL/uL — ABNORMAL LOW (ref 3.87–5.11)
RDW: 16.6 % — AB (ref 11.5–15.5)
WBC: 9.2 10*3/uL (ref 4.0–10.5)

## 2015-08-10 LAB — BASIC METABOLIC PANEL
Anion gap: 17 — ABNORMAL HIGH (ref 5–15)
BUN: 47 mg/dL — AB (ref 6–20)
CHLORIDE: 104 mmol/L (ref 101–111)
CO2: 18 mmol/L — ABNORMAL LOW (ref 22–32)
CREATININE: 9.13 mg/dL — AB (ref 0.44–1.00)
Calcium: 7.5 mg/dL — ABNORMAL LOW (ref 8.9–10.3)
GFR calc Af Amer: 6 mL/min — ABNORMAL LOW (ref >60)
GFR calc non Af Amer: 5 mL/min — ABNORMAL LOW (ref >60)
GLUCOSE: 114 mg/dL — AB (ref 65–99)
POTASSIUM: 5.3 mmol/L — AB (ref 3.5–5.1)
Sodium: 139 mmol/L (ref 135–145)

## 2015-08-10 LAB — PROTIME-INR
INR: 1.56 — ABNORMAL HIGH (ref 0.00–1.49)
Prothrombin Time: 18.7 seconds — ABNORMAL HIGH (ref 11.6–15.2)

## 2015-08-10 LAB — HEPARIN LEVEL (UNFRACTIONATED): Heparin Unfractionated: 1.86 IU/mL — ABNORMAL HIGH (ref 0.30–0.70)

## 2015-08-10 LAB — GLUCOSE, CAPILLARY
GLUCOSE-CAPILLARY: 98 mg/dL (ref 65–99)
GLUCOSE-CAPILLARY: 103 mg/dL — AB (ref 65–99)
GLUCOSE-CAPILLARY: 95 mg/dL (ref 65–99)
Glucose-Capillary: 101 mg/dL — ABNORMAL HIGH (ref 65–99)
Glucose-Capillary: 113 mg/dL — ABNORMAL HIGH (ref 65–99)
Glucose-Capillary: 94 mg/dL (ref 65–99)

## 2015-08-10 MED ORDER — HEPARIN BOLUS VIA INFUSION
2500.0000 [IU] | Freq: Once | INTRAVENOUS | Status: AC
  Administered 2015-08-10: 2500 [IU] via INTRAVENOUS
  Filled 2015-08-10: qty 2500

## 2015-08-10 MED ORDER — HEPARIN (PORCINE) IN NACL 100-0.45 UNIT/ML-% IJ SOLN
800.0000 [IU]/h | INTRAMUSCULAR | Status: DC
  Administered 2015-08-10: 800 [IU]/h via INTRAVENOUS
  Filled 2015-08-10: qty 250

## 2015-08-10 MED ORDER — DIPHENHYDRAMINE HCL 50 MG/ML IJ SOLN
50.0000 mg | Freq: Two times a day (BID) | INTRAMUSCULAR | Status: AC
  Administered 2015-08-10 – 2015-08-11 (×2): 50 mg via INTRAVENOUS
  Filled 2015-08-10 (×2): qty 1

## 2015-08-10 MED ORDER — DIPHENHYDRAMINE HCL 50 MG/ML IJ SOLN
25.0000 mg | Freq: Four times a day (QID) | INTRAMUSCULAR | Status: DC | PRN
  Administered 2015-08-10 – 2015-08-11 (×2): 25 mg via INTRAVENOUS
  Filled 2015-08-10: qty 1

## 2015-08-10 MED ORDER — WARFARIN SODIUM 10 MG PO TABS
10.0000 mg | ORAL_TABLET | Freq: Once | ORAL | Status: AC
  Administered 2015-08-10: 10 mg via ORAL
  Filled 2015-08-10: qty 1

## 2015-08-10 NOTE — Progress Notes (Signed)
PULMONARY / CRITICAL CARE MEDICINE   Name: Crystal Dunlap MRN: [id number] DOB: 1991/02/24    ADMISSION DATE:  08/09/2015  REFERRING MD:  [facility]  CHIEF COMPLAINT:  Short of breath  HISTORY OF PRESENT ILLNESS:   25 yo female with hx of ESRD and iron deficiency anemia was given ferrlecit on 08/07/15.  Pt's mother reports that pt c/o swelling and feeling like throat was closing after HD on 2/08.  This resolved, and she did not have further complaints.  She had HD again on 2/10.  Mother thinks she completed HD.  She was given another dose of ferrlecit, and almost immediately developed tongue/lip swelling.  She was transferred to [facility].  She was intubated, and started on epinephrine gtt.  She was also given solumedrol, pepcid, She was transferred to [facility].  Her blood pressure was better, and she was weaned off epinephrine.  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  She is normally followed at [place] [facility], and gets HD M/W/F.  SUBJECTIVE:  On vent awake  VITAL SIGNS: BP 101/84 mmHg  Pulse 90  Temp(Src) 97.7 F (36.5 C) (Axillary)  Resp 17  Ht 5\' 3"  (1.6 m)  Wt 49.9 kg (110 lb 0.2 oz)  BMI 19.49 kg/m2  SpO2 99%  HEMODYNAMICS:    VENTILATOR SETTINGS: Vent Mode:  [-] PSV;CPAP FiO2 (%):  [40 %-100 %] 40 % Set Rate:  [18 bmp] 18 bmp Vt Set:  [360 mL] 360 mL PEEP:  [5 cmH20] 5 cmH20 Pressure Support:  [5 cmH20] 5 cmH20 Plateau Pressure:  [15 cmH20-19 cmH20] 15 cmH20  INTAKE / OUTPUT: I/O last 3 completed shifts: In: 579.4 [I.V.:519.4; NG/GT:60] Out: -   PHYSICAL EXAMINATION: General:  Sedated, FC Neuro:  RASS 0 HEENT:  ETT in place, swollen tongue/lips remains but is better Cardiovascular:  Regular, tachycardic, HD cath in Lt upper chest Lungs: CTA Abdomen:  abd soft, non tender Musculoskeletal:  No edema Skin:  No hives  LABS:  BMET  Recent Labs Lab  08/09/15 1532  NA 141  K 3.4*  CL 103  CO2 24  BUN 21*  CREATININE 6.07*  GLUCOSE 189*    Electrolytes  Recent Labs Lab 08/09/15 1532  CALCIUM 7.9*  MG 1.9    CBC  Recent Labs Lab 08/09/15 1532  WBC 8.3  HGB 12.2  HCT 39.2  PLT 204    Coag's  Recent Labs Lab 08/09/15 1532  INR 1.28    Sepsis Markers No results for input(s): LATICACIDVEN, PROCALCITON, O2SATVEN in the last 168 hours.  ABG  Recent Labs Lab 08/09/15 1356  PHART 7.312*  PCO2ART 41.8  PO2ART 284.0*    Liver Enzymes  Recent Labs Lab 08/09/15 1532  AST 30  ALT 23  ALKPHOS 127*  BILITOT 0.5  ALBUMIN 4.3    Cardiac Enzymes No results for input(s): TROPONINI, PROBNP in the last 168 hours.  Glucose  Recent Labs Lab 08/09/15 1347 08/09/15 1512 08/09/15 1938 08/09/15 2330 08/10/15 0343 08/10/15 0733  GLUCAP 221* 176* 111* 113* 98 103*    Imaging Dg Chest Port 1 View  08/10/2015  CLINICAL DATA:  Respiratory failure and shortness of breath EXAM: PORTABLE CHEST 1 VIEW COMPARISON:  Yesterday FINDINGS: Endotracheal tube tip between the clavicular heads and carina. The orogastric tube reaches the stomach at least. Dialysis catheter from the left tip at the upper right atrium. Small  pleural effusions. There is no edema, consolidation, or pneumothorax. Normal heart size. IMPRESSION: 1. Stable positioning of tubes and dialysis catheter. 2. Small pleural effusions. Electronically Signed   By: Monte Fantasia M.D.   On: 08/10/2015 07:35   Dg Chest Portable 1 View  08/09/2015  CLINICAL DATA:  Nonresponsive Chronic kidney failure. EXAM: PORTABLE CHEST 1 VIEW COMPARISON:  None. FINDINGS: Nasogastric tube with the tip projecting over the body of the stomach. Dual-lumen central venous catheter with the tip projecting over the cavoatrial junction. Endotracheal tube with the tip 2.9 cm above the carina. There is no focal parenchymal opacity. There is no pleural effusion or pneumothorax. The heart  and mediastinal contours are unremarkable. The osseous structures are unremarkable. IMPRESSION: Nasogastric tube with the tip projecting over the body of the stomach. Dual-lumen central venous catheter with the tip projecting over the cavoatrial junction. Endotracheal tube with the tip 2.9 cm above the carina. Electronically Signed   By: Kathreen Devoid   On: 08/09/2015 12:40     STUDIES:   CULTURES:  ANTIBIOTICS:  SIGNIFICANT EVENTS: 2/10 Admit  LINES/TUBES: 2/10 ETT >>  DISCUSSION: 25 yo female developed anaphylaxis with angioedema after receiving ferrlecit during HD for iron deficiency anemia.  She transiently required pressors, and required intubation.  ASSESSMENT / PLAN:  PULMONARY A: Acute hypoxic respiratory failure 2nd to anaphylaxis with angioedema. P:   Full vent support for now pcxr NOt with edema no asp Solumedrol, pepcid, benadryl, wold extend this Secretions remain Wean cpap 5 ps 5, goal 2 hr Assess leak Hd per renal, does not appear to need  CARDIOVASCULAR A:  Anaphylaxis >> weaned off pressors. Hx of HTN, HLD. 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 P:  IV fluid at 30 ml/hr, kvo Hold outpt coreg, clonidine, crestor Low threshold start clonidine as will rebound Coumadin per pharmacy  RENAL A:   Hx of focal segmental glomerulosclerosis with ESRD s/p failed renal transplant >> on HD M/W/F with [facility] outpt HD. P:   Renal panel in am  Repeat chem now  No edema , k wnl, kvo  GASTROINTESTINAL A:   Nutrition. P:   NPO >> tube feeds if not able to extubate today Pepcid for SUP  HEMATOLOGIC A:   Iron deficiency anemia, coumadin P:  F/u CBC SCDs  INFECTIOUS A:   No evidence for infection. P:   Monitor clinically  ENDOCRINE A:   Steroid induced hyperglycemia.   P:   SSI while on steroids  NEUROLOGIC A:   Acute encephalopathy 2nd to anaphylaxis. P:   RASS goal: 0 WUA mandatory  Updated pt's mother at bedside. By me  CC time 30  minutes.  Lavon Paganini. Titus Mould, MD, [place] Pgr: [facility]

## 2015-08-10 NOTE — Procedures (Signed)
Extubation Procedure Note  Patient Details:   Name: Crystal Dunlap DOB: Apr 11, 1991 MRN: [id number]   Airway Documentation:     Evaluation  O2 sats: stable throughout Complications: No apparent complications Patient did tolerate procedure well. Bilateral Breath Sounds: Clear Suctioning: Oral Yes   Patient extubated to 2L nasal cannula per MD order.  Positive cuff leak noted.  No evidence of stridor.  Patient able to speak post extubation.  Sats currently 98%.  Vitals are stable.  No apparent complications.    Philomena Doheny 08/10/2015, 11:35 AM

## 2015-08-10 NOTE — Progress Notes (Signed)
ANTICOAGULATION CONSULT NOTE - Consult  Pharmacy Consult for warfarin, adding heparin Indication: DVT  Allergies  Allergen Reactions  . Ferrlecit [Na Ferric Gluc Cplx In Sucrose] Anaphylaxis  . Cefazolin   . Cyclosporine     Loss of motor skills  . Lactose Intolerance (Gi)   . Latex   . Vancomycin Rash    Patient Measurements: Height: 5\' 3"  (160 cm) Weight: 110 lb 0.2 oz (49.9 kg) IBW/kg (Calculated) : 52.4   Vital Signs: Temp: 98.2 F (36.8 C) (02/11 1230) Temp Source: Oral (02/11 1230) BP: 101/84 mmHg (02/11 1000) Pulse Rate: 90 (02/11 1000)  Labs:  Recent Labs  08/09/15 1532  HGB 12.2  HCT 39.2  PLT 204  LABPROT 16.2*  INR 1.28  CREATININE 6.07*    Estimated Creatinine Clearance: 11.2 mL/min (by C-G formula based on Cr of 6.07).   Medical History: Past Medical History  Diagnosis Date  . Anemia   . ESRD (end stage renal disease) ([place]) Feb. 12, 2010    First date of Dialysis   . Secondary hyperparathyroidism ([place])   . Pulmonary emboli ([place])   . Hypertension   . GERD (gastroesophageal reflux disease)   . Hyperlipidemia   . 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  On warfarin 7 mg daily at home. Unsure of compliance with low INR. With hx of dvt will bridge patient with heparin until INR is therapeutic  Goal of Therapy:  INR 2-3 Heparin level 0.3-0.7 units/ml Monitor platelets by anticoagulation protocol: Yes   Plan:  Heparin 2500 unit bolus followed by an infusion of 800 units/hr Warfarin 10 mg x 1 tonight 2100 HL Daily HL, INR, CBC F/U D/C of heparin when INR within goal Monitor for s/sx of bleeding  Levester Fresh, PharmD, BCPS, [facility] Clinical Pharmacist Pager [phone] 08/10/2015 1:25 PM

## 2015-08-11 ENCOUNTER — Encounter (HOSPITAL_COMMUNITY): Payer: Self-pay

## 2015-08-11 LAB — GLUCOSE, CAPILLARY
GLUCOSE-CAPILLARY: 92 mg/dL (ref 65–99)
GLUCOSE-CAPILLARY: 108 mg/dL — AB (ref 65–99)
GLUCOSE-CAPILLARY: 94 mg/dL (ref 65–99)
GLUCOSE-CAPILLARY: 135 mg/dL — AB (ref 65–99)
Glucose-Capillary: 96 mg/dL (ref 65–99)

## 2015-08-11 LAB — RENAL FUNCTION PANEL
ALBUMIN: 4 g/dL (ref 3.5–5.0)
ANION GAP: 18 — AB (ref 5–15)
BUN: 65 mg/dL — ABNORMAL HIGH (ref 6–20)
CALCIUM: 6.4 mg/dL — AB (ref 8.9–10.3)
CO2: 18 mmol/L — AB (ref 22–32)
CREATININE: 10.17 mg/dL — AB (ref 0.44–1.00)
Chloride: 105 mmol/L (ref 101–111)
GFR calc non Af Amer: 5 mL/min — ABNORMAL LOW (ref >60)
GFR, EST AFRICAN AMERICAN: 5 mL/min — AB (ref >60)
GLUCOSE: 119 mg/dL — AB (ref 65–99)
PHOSPHORUS: 6.6 mg/dL — AB (ref 2.5–4.6)
Potassium: 6.2 mmol/L (ref 3.5–5.1)
SODIUM: 141 mmol/L (ref 135–145)

## 2015-08-11 LAB — CBC
HCT: 29.4 % — ABNORMAL LOW (ref 36.0–46.0)
HEMOGLOBIN: 9 g/dL — AB (ref 12.0–15.0)
MCH: 31.7 pg (ref 26.0–34.0)
MCHC: 30.6 g/dL (ref 30.0–36.0)
MCV: 103.5 fL — ABNORMAL HIGH (ref 78.0–100.0)
PLATELETS: 154 10*3/uL (ref 150–400)
RBC: 2.84 MIL/uL — AB (ref 3.87–5.11)
RDW: 16.6 % — ABNORMAL HIGH (ref 11.5–15.5)
WBC: 6.4 10*3/uL (ref 4.0–10.5)

## 2015-08-11 LAB — BASIC METABOLIC PANEL
Anion gap: 21 — ABNORMAL HIGH (ref 5–15)
BUN: 81 mg/dL — AB (ref 6–20)
CHLORIDE: 102 mmol/L (ref 101–111)
CO2: 17 mmol/L — ABNORMAL LOW (ref 22–32)
CREATININE: 11.72 mg/dL — AB (ref 0.44–1.00)
Calcium: 6.2 mg/dL — CL (ref 8.9–10.3)
GFR calc Af Amer: 5 mL/min — ABNORMAL LOW (ref >60)
GFR calc non Af Amer: 4 mL/min — ABNORMAL LOW (ref >60)
GLUCOSE: 143 mg/dL — AB (ref 65–99)
POTASSIUM: 5.5 mmol/L — AB (ref 3.5–5.1)
Sodium: 140 mmol/L (ref 135–145)

## 2015-08-11 LAB — PROTIME-INR
INR: 1.95 — ABNORMAL HIGH (ref 0.00–1.49)
PROTHROMBIN TIME: 22.1 s — AB (ref 11.6–15.2)

## 2015-08-11 LAB — HEPARIN LEVEL (UNFRACTIONATED): HEPARIN UNFRACTIONATED: 0.31 [IU]/mL (ref 0.30–0.70)

## 2015-08-11 MED ORDER — HEPARIN (PORCINE) IN NACL 100-0.45 UNIT/ML-% IJ SOLN
600.0000 [IU]/h | INTRAMUSCULAR | Status: DC
  Filled 2015-08-11: qty 250

## 2015-08-11 MED ORDER — SODIUM POLYSTYRENE SULFONATE 15 GM/60ML PO SUSP
15.0000 g | Freq: Once | ORAL | Status: AC
  Administered 2015-08-11: 15 g via ORAL
  Filled 2015-08-11: qty 60

## 2015-08-11 MED ORDER — KETOROLAC TROMETHAMINE 10 MG PO TABS
10.0000 mg | ORAL_TABLET | Freq: Three times a day (TID) | ORAL | Status: DC | PRN
  Administered 2015-08-12: 10 mg via ORAL
  Filled 2015-08-11 (×3): qty 1

## 2015-08-11 MED ORDER — PREDNISONE 10 MG PO TABS: 10.0000 mg | ORAL_TABLET | Freq: Every day | ORAL | Status: DC

## 2015-08-11 MED ORDER — PREDNISONE 20 MG PO TABS
20.0000 mg | ORAL_TABLET | Freq: Two times a day (BID) | ORAL | Status: AC
  Administered 2015-08-11 – 2015-08-13 (×4): 20 mg via ORAL
  Filled 2015-08-11 (×6): qty 1

## 2015-08-11 MED ORDER — PREDNISONE 10 MG PO TABS
10.0000 mg | ORAL_TABLET | Freq: Two times a day (BID) | ORAL | Status: AC
  Administered 2015-08-13 – 2015-08-15 (×4): 10 mg via ORAL
  Filled 2015-08-11 (×4): qty 1

## 2015-08-11 MED ORDER — SODIUM POLYSTYRENE SULFONATE 15 GM/60ML PO SUSP
30.0000 g | Freq: Once | ORAL | Status: AC
  Administered 2015-08-12: 30 g via ORAL
  Filled 2015-08-11: qty 120

## 2015-08-11 MED ORDER — WARFARIN SODIUM 10 MG PO TABS
10.0000 mg | ORAL_TABLET | Freq: Once | ORAL | Status: AC
  Administered 2015-08-11: 10 mg via ORAL
  Filled 2015-08-11 (×2): qty 1

## 2015-08-11 MED ORDER — CETYLPYRIDINIUM CHLORIDE 0.05 % MT LIQD
7.0000 mL | Freq: Two times a day (BID) | OROMUCOSAL | Status: DC
  Administered 2015-08-11 – 2015-08-14 (×4): 7 mL via OROMUCOSAL

## 2015-08-11 NOTE — Progress Notes (Signed)
ANTICOAGULATION CONSULT NOTE - Consult  Pharmacy Consult for heparin Indication: DVT  Allergies  Allergen Reactions  . Ferrlecit [Na Ferric Gluc Cplx In Sucrose] Anaphylaxis  . Cefazolin   . Cyclosporine     Loss of motor skills  . Lactose Intolerance (Gi)   . Latex   . Vancomycin Rash    Patient Measurements: Height: 5\' 3"  (160 cm) Weight: 110 lb 0.2 oz (49.9 kg) IBW/kg (Calculated) : 52.4   Vital Signs: Temp: 98.4 F (36.9 C) (02/11 2327) Temp Source: Oral (02/11 2327) BP: 111/89 mmHg (02/12 0000) Pulse Rate: 72 (02/12 0000)  Labs:  Recent Labs  08/09/15 1532 08/10/15 1401 08/10/15 2052  HGB 12.2 10.8*  --   HCT 39.2 33.8*  --   PLT 204 185  --   LABPROT 16.2* 18.7*  --   INR 1.28 1.56*  --   HEPARINUNFRC  --   --  1.86*  CREATININE 6.07* 9.13*  --     Estimated Creatinine Clearance: 7.4 mL/min (by C-G formula based on Cr of 9.13).   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 Pt on coumadin with subtherapeutic INR - now on heparin bridge. Heparin level 1.86 on initial check at 800 units/hr.Level drawn from same arm where heparin infusing - unable to obtain from other arm as it is restricted. RN paused heparin before lab drawn - heparin level still likely artificially elevated due to proximity to site where gtt infusing. CCM resident does not want to do footstick so we will adjust based on the elevated level that we have.  Goal of Therapy:  INR 2-3 Heparin level 0.3-0.7 units/ml Monitor platelets by anticoagulation protocol: Yes   Plan:  Hold heparin x 1 hour Restart heparin at 600 units/hr Will f/u 6 hour heparin level  Sherlon Handing, PharmD, BCPS Clinical pharmacist, pager [phone] 08/11/2015 12:23 AM

## 2015-08-11 NOTE — Consult Note (Signed)
[facility] Consult Note     Date: 08/11/2015                  Patient Name:  Crystal Dunlap  MRN: [id number]  DOB: 1990-11-24  Age / Sex: 25 y.o., female         PCP: No primary care provider on file.                 Service Requesting Consult: CCM Titus Mould)                 Reason for Consult: HD, hyperkalemia            Chief Complaint: SOB HPI: 25 yo female with hx of ESRD and iron deficiency anemia was given ferrlecit on 08/07/15. Pt's mother reports that pt c/o swelling and feeling like throat was closing after HD on 2/08. This resolved, and she did not have further complaints. She had HD again on 2/10. Mother thinks she completed HD. She was given another dose of ferrlecit, and almost immediately developed tongue/lip swelling. She was [facility]. She was intubated, and started on epinephrine gtt. She was also given solumedrol, pepcid, She was transferred to [facility]. Her blood pressure was better, and she was weaned off epinephrine.  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 She is normally followed at [facility] for outpt dialysis, and gets HD M/W/F in [facility].  She reports she no longer makes urine.  She is eating well. Reports she has had 2 fistula trials that have failed, so tunneled catheter is used for HD access.  Past Medical History  Diagnosis Date  . Anemia   . ESRD (end stage renal disease) ([place]) Feb. 12, 2010    First date of Dialysis   . Secondary hyperparathyroidism ([place])   . Pulmonary emboli ([place])   . Hypertension   . GERD (gastroesophageal reflux disease)   . Hyperlipidemia   . Focal segmental glomerulosclerosis     Past Surgical History  Procedure Laterality Date  . Kidney transplant      No family history on file. Social History:  reports that she has never smoked. She has never used smokeless tobacco. She  reports that she does not drink alcohol or use illicit drugs.  Allergies:  Allergies  Allergen Reactions  . Ferrlecit [Na Ferric Gluc Cplx In Sucrose] Anaphylaxis  . Cefazolin   . Cyclosporine     Loss of motor skills  . Lactose Intolerance (Gi)   . Latex   . Vancomycin Rash    Medications Prior to Admission  Medication Sig Dispense Refill  . calcium acetate (PHOSLO) 667 MG capsule Take 1,334 mg by mouth 3 (three) times daily with meals.     . carvedilol (COREG) 25 MG tablet Take 25 mg by mouth 2 (two) times daily with a meal.    . cloNIDine HCl (KAPVAY) 0.1 MG TB12 ER tablet Take 0.1 mg by mouth 2 (two) times daily.     . folic acid (FOLVITE) 1 MG tablet Take 1 mg by mouth daily.    . multivitamin (RENA-VIT) TABS tablet Take 1 tablet by mouth daily.  11  . ranitidine (ZANTAC) 300 MG capsule Take 300 mg by mouth every evening.    . rosuvastatin (CRESTOR) 10 MG tablet Take 10 mg by mouth daily.    Marland Kitchen warfarin (COUMADIN) 1 MG tablet Take 2  mg by mouth daily after supper.     . warfarin (COUMADIN) 5 MG tablet Take 5 mg by mouth daily after supper.       Results for orders placed or performed during the hospital encounter of 08/09/15 (from the past 48 hour(s))  MRSA PCR Screening     Status: None   Collection Time: 08/09/15  1:44 PM  Result Value Ref Range   MRSA by PCR NEGATIVE NEGATIVE    Comment:        The GeneXpert MRSA Assay (FDA approved for NASAL specimens only), is one component of a comprehensive MRSA colonization surveillance program. It is not intended to diagnose MRSA infection nor to guide or monitor treatment for MRSA infections.   Glucose, capillary     Status: Abnormal   Collection Time: 08/09/15  1:47 PM  Result Value Ref Range   Glucose-Capillary 221 (H) 65 - 99 mg/dL  I-STAT 3, arterial blood gas (G3+)     Status: Abnormal   Collection Time: 08/09/15  1:56 PM  Result Value Ref Range   pH, Arterial 7.312 (L) 7.350 - 7.450   pCO2 arterial 41.8 35.0 -  45.0 mmHg   pO2, Arterial 284.0 (H) 80.0 - 100.0 mmHg   Bicarbonate 21.2 20.0 - 24.0 mEq/L   TCO2 22 0 - 100 mmol/L   O2 Saturation 100.0 %   Acid-base deficit 5.0 (H) 0.0 - 2.0 mmol/L   Patient temperature 98.6 F    Collection site RADIAL, ALLEN'S TEST ACCEPTABLE    Drawn by RT    Sample type ARTERIAL   Glucose, capillary     Status: Abnormal   Collection Time: 08/09/15  3:12 PM  Result Value Ref Range   Glucose-Capillary 176 (H) 65 - 99 mg/dL  Comprehensive metabolic panel     Status: Abnormal   Collection Time: 08/09/15  3:32 PM  Result Value Ref Range   Sodium 141 135 - 145 mmol/L   Potassium 3.4 (L) 3.5 - 5.1 mmol/L   Chloride 103 101 - 111 mmol/L   CO2 24 22 - 32 mmol/L   Glucose, Bld 189 (H) 65 - 99 mg/dL   BUN 21 (H) 6 - 20 mg/dL   Creatinine, Ser 6.07 (H) 0.44 - 1.00 mg/dL   Calcium 7.9 (L) 8.9 - 10.3 mg/dL   Total Protein 8.3 (H) 6.5 - 8.1 g/dL   Albumin 4.3 3.5 - 5.0 g/dL   AST 30 15 - 41 U/L   ALT 23 14 - 54 U/L   Alkaline Phosphatase 127 (H) 38 - 126 U/L   Total Bilirubin 0.5 0.3 - 1.2 mg/dL   GFR calc non Af Amer 9 (L) >60 mL/min   GFR calc Af Amer 10 (L) >60 mL/min    Comment: (NOTE) The eGFR has been calculated using the CKD EPI equation. This calculation has not been validated in all clinical situations. eGFR's persistently <60 mL/min signify possible Chronic Kidney Disease.    Anion gap 14 5 - 15  Magnesium     Status: None   Collection Time: 08/09/15  3:32 PM  Result Value Ref Range   Magnesium 1.9 1.7 - 2.4 mg/dL  CBC with Differential/Platelet     Status: Abnormal   Collection Time: 08/09/15  3:32 PM  Result Value Ref Range   WBC 8.3 4.0 - 10.5 K/uL   RBC 3.79 (L) 3.87 - 5.11 MIL/uL   Hemoglobin 12.2 12.0 - 15.0 g/dL   HCT 39.2 36.0 - 46.0 %  MCV 103.4 (H) 78.0 - 100.0 fL   MCH 32.2 26.0 - 34.0 pg   MCHC 31.1 30.0 - 36.0 g/dL   RDW 16.1 (H) 11.5 - 15.5 %   Platelets 204 150 - 400 K/uL   Neutrophils Relative % 86 %   Neutro Abs 7.1 1.7 -  7.7 K/uL   Lymphocytes Relative 11 %   Lymphs Abs 0.9 0.7 - 4.0 K/uL   Monocytes Relative 2 %   Monocytes Absolute 0.2 0.1 - 1.0 K/uL   Eosinophils Relative 1 %   Eosinophils Absolute 0.1 0.0 - 0.7 K/uL   Basophils Relative 0 %   Basophils Absolute 0.0 0.0 - 0.1 K/uL  Protime-INR     Status: Abnormal   Collection Time: 08/09/15  3:32 PM  Result Value Ref Range   Prothrombin Time 16.2 (H) 11.6 - 15.2 seconds   INR 1.28 0.00 - 1.49  Glucose, capillary     Status: Abnormal   Collection Time: 08/09/15  7:38 PM  Result Value Ref Range   Glucose-Capillary 111 (H) 65 - 99 mg/dL  Glucose, capillary     Status: Abnormal   Collection Time: 08/09/15 11:30 PM  Result Value Ref Range   Glucose-Capillary 113 (H) 65 - 99 mg/dL  Glucose, capillary     Status: None   Collection Time: 08/10/15  3:43 AM  Result Value Ref Range   Glucose-Capillary 98 65 - 99 mg/dL  Glucose, capillary     Status: Abnormal   Collection Time: 08/10/15  7:33 AM  Result Value Ref Range   Glucose-Capillary 103 (H) 65 - 99 mg/dL  Glucose, capillary     Status: None   Collection Time: 08/10/15 12:27 PM  Result Value Ref Range   Glucose-Capillary 95 65 - 99 mg/dL  Basic metabolic panel     Status: Abnormal   Collection Time: 08/10/15  2:01 PM  Result Value Ref Range   Sodium 139 135 - 145 mmol/L   Potassium 5.3 (H) 3.5 - 5.1 mmol/L    Comment: DELTA CHECK NOTED   Chloride 104 101 - 111 mmol/L   CO2 18 (L) 22 - 32 mmol/L   Glucose, Bld 114 (H) 65 - 99 mg/dL   BUN 47 (H) 6 - 20 mg/dL   Creatinine, Ser 9.13 (H) 0.44 - 1.00 mg/dL    Comment: DIALYSIS   Calcium 7.5 (L) 8.9 - 10.3 mg/dL   GFR calc non Af Amer 5 (L) >60 mL/min   GFR calc Af Amer 6 (L) >60 mL/min    Comment: (NOTE) The eGFR has been calculated using the CKD EPI equation. This calculation has not been validated in all clinical situations. eGFR's persistently <60 mL/min signify possible Chronic Kidney Disease.    Anion gap 17 (H) 5 - 15  CBC      Status: Abnormal   Collection Time: 08/10/15  2:01 PM  Result Value Ref Range   WBC 9.2 4.0 - 10.5 K/uL   RBC 3.23 (L) 3.87 - 5.11 MIL/uL   Hemoglobin 10.8 (L) 12.0 - 15.0 g/dL   HCT 33.8 (L) 36.0 - 46.0 %   MCV 104.6 (H) 78.0 - 100.0 fL   MCH 33.4 26.0 - 34.0 pg   MCHC 32.0 30.0 - 36.0 g/dL   RDW 16.6 (H) 11.5 - 15.5 %   Platelets 185 150 - 400 K/uL  Protime-INR     Status: Abnormal   Collection Time: 08/10/15  2:01 PM  Result Value Ref Range  Prothrombin Time 18.7 (H) 11.6 - 15.2 seconds   INR 1.56 (H) 0.00 - 1.49  Glucose, capillary     Status: None   Collection Time: 08/10/15  3:24 PM  Result Value Ref Range   Glucose-Capillary 94 65 - 99 mg/dL  Glucose, capillary     Status: Abnormal   Collection Time: 08/10/15  8:22 PM  Result Value Ref Range   Glucose-Capillary 101 (H) 65 - 99 mg/dL   Comment 1 Notify RN   Heparin level (unfractionated)     Status: Abnormal   Collection Time: 08/10/15  8:52 PM  Result Value Ref Range   Heparin Unfractionated 1.86 (H) 0.30 - 0.70 IU/mL    Comment: RESULTS CONFIRMED BY MANUAL DILUTION        IF HEPARIN RESULTS ARE BELOW EXPECTED VALUES, AND PATIENT DOSAGE HAS BEEN CONFIRMED, SUGGEST FOLLOW UP TESTING OF ANTITHROMBIN III LEVELS.   Renal function panel     Status: Abnormal   Collection Time: 08/11/15  5:21 AM  Result Value Ref Range   Sodium 141 135 - 145 mmol/L   Potassium 6.2 (HH) 3.5 - 5.1 mmol/L    Comment: CRITICAL RESULT CALLED TO, READ BACK BY AND VERIFIED WITH: SMITH,V RN [id number] 2.12.17 MCADOO,G    Chloride 105 101 - 111 mmol/L   CO2 18 (L) 22 - 32 mmol/L   Glucose, Bld 119 (H) 65 - 99 mg/dL   BUN 65 (H) 6 - 20 mg/dL   Creatinine, Ser 10.17 (H) 0.44 - 1.00 mg/dL   Calcium 6.4 (LL) 8.9 - 10.3 mg/dL    Comment: DELTA CHECK NOTED CRITICAL RESULT CALLED TO, READ BACK BY AND VERIFIED WITH: SMITH,V RN [id number] 2.12.17 MCADOO,G    Phosphorus 6.6 (H) 2.5 - 4.6 mg/dL   Albumin 4.0 3.5 - 5.0 g/dL   GFR calc non Af Amer 5 (L) >60  mL/min   GFR calc Af Amer 5 (L) >60 mL/min    Comment: (NOTE) The eGFR has been calculated using the CKD EPI equation. This calculation has not been validated in all clinical situations. eGFR's persistently <60 mL/min signify possible Chronic Kidney Disease.    Anion gap 18 (H) 5 - 15  CBC     Status: Abnormal   Collection Time: 08/11/15  5:21 AM  Result Value Ref Range   WBC 6.4 4.0 - 10.5 K/uL   RBC 2.84 (L) 3.87 - 5.11 MIL/uL   Hemoglobin 9.0 (L) 12.0 - 15.0 g/dL   HCT 29.4 (L) 36.0 - 46.0 %   MCV 103.5 (H) 78.0 - 100.0 fL   MCH 31.7 26.0 - 34.0 pg   MCHC 30.6 30.0 - 36.0 g/dL   RDW 16.6 (H) 11.5 - 15.5 %   Platelets 154 150 - 400 K/uL  Heparin level (unfractionated)     Status: None   Collection Time: 08/11/15  5:21 AM  Result Value Ref Range   Heparin Unfractionated 0.31 0.30 - 0.70 IU/mL    Comment:        IF HEPARIN RESULTS ARE BELOW EXPECTED VALUES, AND PATIENT DOSAGE HAS BEEN CONFIRMED, SUGGEST FOLLOW UP TESTING OF ANTITHROMBIN III LEVELS.   Protime-INR     Status: Abnormal   Collection Time: 08/11/15  5:21 AM  Result Value Ref Range   Prothrombin Time 22.1 (H) 11.6 - 15.2 seconds   INR 1.95 (H) 0.00 - 1.49  Glucose, capillary     Status: None   Collection Time: 08/11/15  7:24 AM  Result Value Ref Range  Glucose-Capillary 96 65 - 99 mg/dL   Dg Chest Port 1 View  08/10/2015  CLINICAL DATA:  Respiratory failure and shortness of breath EXAM: PORTABLE CHEST 1 VIEW COMPARISON:  Yesterday FINDINGS: Endotracheal tube tip between the clavicular heads and carina. The orogastric tube reaches the stomach at least. Dialysis catheter from the left tip at the upper right atrium. Small pleural effusions. There is no edema, consolidation, or pneumothorax. Normal heart size. IMPRESSION: 1. Stable positioning of tubes and dialysis catheter. 2. Small pleural effusions. Electronically Signed   By: Monte Fantasia M.D.   On: 08/10/2015 07:35   Dg Chest Portable 1 View  08/09/2015   CLINICAL DATA:  Nonresponsive Chronic kidney failure. EXAM: PORTABLE CHEST 1 VIEW COMPARISON:  None. FINDINGS: Nasogastric tube with the tip projecting over the body of the stomach. Dual-lumen central venous catheter with the tip projecting over the cavoatrial junction. Endotracheal tube with the tip 2.9 cm above the carina. There is no focal parenchymal opacity. There is no pleural effusion or pneumothorax. The heart and mediastinal contours are unremarkable. The osseous structures are unremarkable. IMPRESSION: Nasogastric tube with the tip projecting over the body of the stomach. Dual-lumen central venous catheter with the tip projecting over the cavoatrial junction. Endotracheal tube with the tip 2.9 cm above the carina. Electronically Signed   By: Kathreen Devoid   On: 08/09/2015 12:40    Review of Systems  Constitutional: Negative for fever and chills.  Respiratory: Negative for shortness of breath.   Cardiovascular: Negative for chest pain and leg swelling.  Gastrointestinal: Negative for nausea, vomiting and abdominal pain.  Skin: Negative for itching.  Neurological: Negative for weakness.  All other systems reviewed and are negative.    Blood pressure 144/102, pulse 62, temperature 98.1 F (36.7 C), temperature source Oral, resp. rate 16, height '5\' 3"'$  (1.6 m), weight 110 lb 0.2 oz (49.9 kg), SpO2 92 %. Physical Exam  Constitutional: She is oriented to person, place, and time. She appears well-developed and well-nourished. No distress.  HENT:  Head: Normocephalic and atraumatic.  Mouth/Throat: Oropharynx is clear and moist.  Eyes: Conjunctivae are normal. No scleral icterus.  Neck: Neck supple. No JVD present.  Cardiovascular: Normal rate, regular rhythm and normal heart sounds.   No murmur heard. Respiratory: Effort normal and breath sounds normal. No respiratory distress. She has no wheezes. She has no rales.  GI: Soft. Bowel sounds are normal. She exhibits no distension. There is no  tenderness. There is no rebound and no guarding.  Musculoskeletal: She exhibits no edema or tenderness.  Tunneled catheter in place on L chest, c/d/i  Neurological: She is alert and oriented to person, place, and time.  Skin: Skin is warm and dry. No rash noted.  Psychiatric: She has a normal mood and affect. Her behavior is normal.      Assessment/Plan ESRD: Patient typically followed at [facility] and receives HD MWF. Last HD as scheduled 2/10 - No urgent need for HD currently - Schedule for HD in AM  Anemia of CKD: Hgb 12.2> 10.8 > 9.0 - no further IV iron - given anaphylaxis - Consider starting Aranesp - Continue to monitor  Hyperkalemia: K 3.4 on admission > 5.3 > 6.2 this AM - No need for urgent HD - Dose of Kayexalate and HD in AM  Hypocalcemia, hyperphosphatemia: Ca 7.9 > 7.5 > 6.4. Phos 6.6 - Resume Phoslo and rena-vit - check PTH   Virginia Crews, MD, MPH PGY-2,  [place]  08/11/2015 12:00 PM  Pt seen, examined and agree w A/P as above. ESRD pt from [facility] here with allergic reaction/ angioedema after getting 2nd dose of IV iron.  On HD since 2010 after renal Tx x 2.  Today is off the vent and no lip swelling, mild facial edema. No CHF or resp issues. K slightly elevated. Doesn't require HD today with K 6.2, will plan HD first shift in am Monday.   Kelly Splinter MD [employer] pager [phone]    cell [phone] 08/11/2015, 12:33 PM

## 2015-08-11 NOTE — Progress Notes (Signed)
CRITICAL VALUE ALERT  Critical value received:  K+ 6.2  Date of notification:  08/11/15  Time of notification:  0621  Critical value read back:Yes.    Nurse who received alert:  Donald Siva   MD notified (1st page):  Dr. Oletta Darter  Time of first page:  0621  MD notified (2nd page):  Time of second page:  Responding MD:  Dr. Oletta Darter   Time MD responded:  [id number]

## 2015-08-11 NOTE — Progress Notes (Signed)
PULMONARY / CRITICAL CARE MEDICINE   Name: Crystal Dunlap MRN: [id number] DOB: 1991/01/21    ADMISSION DATE:  08/09/2015  REFERRING MD:  [facility]  CHIEF COMPLAINT:  Short of breath  HISTORY OF PRESENT ILLNESS:   25 yo female with hx of ESRD and iron deficiency anemia was given ferrlecit on 08/07/15.  Pt's mother reports that pt c/o swelling and feeling like throat was closing after HD on 2/08.  This resolved, and she did not have further complaints.  She had HD again on 2/10.  Mother thinks she completed HD.  She was given another dose of ferrlecit, and almost immediately developed tongue/lip swelling.  She was transferred to [facility].  She was intubated, and started on epinephrine gtt.  She was also given solumedrol, pepcid, She was [facility].  Her blood pressure was better, and she was weaned off epinephrine.  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  She is normally followed at [facility], and gets HD M/W/F.  SUBJECTIVE:  Extubated,  No distress  VITAL SIGNS: BP 133/101 mmHg  Pulse 63  Temp(Src) 97.4 F (36.3 C) (Oral)  Resp 14  Ht 5\' 3"  (1.6 m)  Wt 49.9 kg (110 lb 0.2 oz)  BMI 19.49 kg/m2  SpO2 100%  HEMODYNAMICS:    VENTILATOR SETTINGS:    INTAKE / OUTPUT: I/O last 3 completed shifts: In: 1420.1 [P.O.:275; I.V.:1035.1; NG/GT:60; IV Piggyback:50] Out: 0   PHYSICAL EXAMINATION: General: FC , no distress Neuro:  Some pain reported, nonfocal HEENT:  Swelling resolving daily Cardiovascular:  s1 s2 rrr Lungs: CTA Abdomen:  abd soft, non tender Musculoskeletal:  No edema Skin:  No hives  LABS:  BMET  Recent Labs Lab 08/09/15 1532 08/10/15 1401 08/11/15 0521  NA 141 139 141  K 3.4* 5.3* 6.2*  CL 103 104 105  CO2 24 18* 18*  BUN 21* 47* 65*  CREATININE 6.07* 9.13* 10.17*  GLUCOSE 189* 114* 119*    Electrolytes  Recent Labs Lab  08/09/15 1532 08/10/15 1401 08/11/15 0521  CALCIUM 7.9* 7.5* 6.4*  MG 1.9  --   --   PHOS  --   --  6.6*    CBC  Recent Labs Lab 08/09/15 1532 08/10/15 1401 08/11/15 0521  WBC 8.3 9.2 6.4  HGB 12.2 10.8* 9.0*  HCT 39.2 33.8* 29.4*  PLT 204 185 154    Coag's  Recent Labs Lab 08/09/15 1532 08/10/15 1401 08/11/15 0521  INR 1.28 1.56* 1.95*    Sepsis Markers No results for input(s): LATICACIDVEN, PROCALCITON, O2SATVEN in the last 168 hours.  ABG  Recent Labs Lab 08/09/15 1356  PHART 7.312*  PCO2ART 41.8  PO2ART 284.0*    Liver Enzymes  Recent Labs Lab 08/09/15 1532 08/11/15 0521  AST 30  --   ALT 23  --   ALKPHOS 127*  --   BILITOT 0.5  --   ALBUMIN 4.3 4.0    Cardiac Enzymes No results for input(s): TROPONINI, PROBNP in the last 168 hours.  Glucose  Recent Labs Lab 08/10/15 0343 08/10/15 0733 08/10/15 1227 08/10/15 1524 08/10/15 2022 08/11/15 0724  GLUCAP 98 103* 95 94 101* 96    Imaging No results found.   STUDIES:   CULTURES:  ANTIBIOTICS:  SIGNIFICANT EVENTS: 2/10 Admit  LINES/TUBES: 2/10 ETT >>2/11  DISCUSSION: 25 yo female developed anaphylaxis with angioedema after receiving ferrlecit  during HD for iron deficiency anemia.  She transiently required pressors, and required intubation.  ASSESSMENT / PLAN:  PULMONARY A: Acute hypoxic respiratory failure 2nd to anaphylaxis with angioedema resolved P:   IS Solumedrol, change to prednisone 20 bid- to taper to off over next 3 days maintyain  Dc benadry - done  CARDIOVASCULAR A:  Anaphylaxis >> weaned off pressors. Hx of HTN, HLD. 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 P:   bmet in pm after HD\ Renal called for hd today kvo  GASTROINTESTINAL A:   Nutrition. P:   Start diet- renal Pepcid for SUP, reduce in am to off if not home med  HEMATOLOGIC A:   Iron deficiency anemia, coumadin P:  SCDs Hep / coum inr noted , dc hep  INFECTIOUS A:   No evidence for infection. P:   Monitor clinically  ENDOCRINE A:   Steroid induced hyperglycemia.   P:   SSI while on steroids Reduce steroids  NEUROLOGIC A:   Acute encephalopathy 2nd to anaphylaxis. P:   RASS goal: 0 WUA mandatory  Updated pt's mother at bedside. By me  To floor , for hd today   Lavon Paganini. Titus Mould, MD, [place] Pgr: [facility]

## 2015-08-11 NOTE — Progress Notes (Signed)
[facility] Progress Note Patient Name: Mansirat Pugliese DOB: 1991/01/24 MRN: [id number]   Date of Service  08/11/2015  HPI/Events of Note  Nursing requesting adjustment of meds  eICU Interventions  Will try toradol     Intervention Category Intermediate Interventions: Pain - evaluation and management  Dimas Chyle 08/11/2015, 5:28 PM

## 2015-08-11 NOTE — Progress Notes (Signed)
Patient is a difficult lab draw and two lab tech have been by with multiple unsuccessful sticks.  I medicated patient before and still unable to keep her calm.  [place] notified.  Morning labs are unable to be drawn.

## 2015-08-11 NOTE — Progress Notes (Signed)
ANTICOAGULATION CONSULT NOTE - Consult  Pharmacy Consult for warfarin, adding heparin Indication: DVT  Allergies  Allergen Reactions  . Ferrlecit [Na Ferric Gluc Cplx In Sucrose] Anaphylaxis  . Cefazolin   . Cyclosporine     Loss of motor skills  . Lactose Intolerance (Gi)   . Latex   . Vancomycin Rash    Patient Measurements: Height: 5\' 3"  (160 cm) Weight: 110 lb 0.2 oz (49.9 kg) IBW/kg (Calculated) : 52.4   Vital Signs: Temp: 97.4 F (36.3 C) (02/12 0727) Temp Source: Oral (02/12 0727) BP: 133/101 mmHg (02/12 0700) Pulse Rate: 63 (02/12 0700)  Labs:  Recent Labs  08/09/15 1532 08/10/15 1401 08/10/15 2052 08/11/15 0521  HGB 12.2 10.8*  --  9.0*  HCT 39.2 33.8*  --  29.4*  PLT 204 185  --  154  LABPROT 16.2* 18.7*  --  22.1*  INR 1.28 1.56*  --  1.95*  HEPARINUNFRC  --   --  1.86* 0.31  CREATININE 6.07* 9.13*  --  10.17*    Estimated Creatinine Clearance: 6.7 mL/min (by C-G formula based on Cr of 10.17).   Medical History: Past Medical History  Diagnosis Date  . Anemia   . ESRD (end stage renal disease) ([place]) Feb. 12, 2010    First date of Dialysis   . Secondary hyperparathyroidism ([place])   . Pulmonary emboli ([place])   . Hypertension   . GERD (gastroesophageal reflux disease)   . Hyperlipidemia   . 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  On warfarin 7 mg daily at home. Unsure of compliance with low INR.   INR this AM is 1.95, pt hard stick for labs  Goal of Therapy:  INR 2-3 Monitor platelets by anticoagulation protocol: Yes   Plan:  D/C IV Heparin Warfarin 10 mg x 1 tonight Daily INR, CBC Monitor for s/sx of bleeding  Levester Fresh, PharmD, BCPS, [facility] Clinical Pharmacist Pager [phone] 08/11/2015 10:19 AM

## 2015-08-11 NOTE — Progress Notes (Signed)
Patient arrived on unit via bed from 70M.  No family at bedside.  Telemetry placed per MD order and CMT notified.

## 2015-08-11 NOTE — Progress Notes (Signed)
Patient's heparin level drawn and was high, pharmacy wanted another sample because they though that it wasn't real.  Lab came and stuck her serveral times with no blood return.  CCM doctor notified and she believes the heparin level was real. The patient will not be stuck again at this time.  Pharmacy advised me to turn off heparin for one hour.  Will continue to monitor.

## 2015-08-12 ENCOUNTER — Inpatient Hospital Stay (HOSPITAL_COMMUNITY): Payer: Medicare Other

## 2015-08-12 DIAGNOSIS — T783XXD Angioneurotic edema, subsequent encounter: Secondary | ICD-10-CM

## 2015-08-12 LAB — RENAL FUNCTION PANEL
ALBUMIN: 4 g/dL (ref 3.5–5.0)
Anion gap: 22 — ABNORMAL HIGH (ref 5–15)
BUN: 84 mg/dL — AB (ref 6–20)
CO2: 18 mmol/L — AB (ref 22–32)
Calcium: 5.7 mg/dL — CL (ref 8.9–10.3)
Chloride: 100 mmol/L — ABNORMAL LOW (ref 101–111)
Creatinine, Ser: 12.06 mg/dL — ABNORMAL HIGH (ref 0.44–1.00)
GFR calc Af Amer: 4 mL/min — ABNORMAL LOW (ref >60)
GFR calc non Af Amer: 4 mL/min — ABNORMAL LOW (ref >60)
GLUCOSE: 101 mg/dL — AB (ref 65–99)
PHOSPHORUS: 6.6 mg/dL — AB (ref 2.5–4.6)
POTASSIUM: 4.1 mmol/L (ref 3.5–5.1)
SODIUM: 140 mmol/L (ref 135–145)

## 2015-08-12 LAB — CBC WITH DIFFERENTIAL/PLATELET
BASOS PCT: 0 %
Basophils Absolute: 0 10*3/uL (ref 0.0–0.1)
EOS ABS: 0 10*3/uL (ref 0.0–0.7)
Eosinophils Relative: 0 %
Lymphocytes Relative: 13 %
Lymphs Abs: 0.6 10*3/uL — ABNORMAL LOW (ref 0.7–4.0)
MONO ABS: 0.3 10*3/uL (ref 0.1–1.0)
MONOS PCT: 5 %
Neutro Abs: 3.8 10*3/uL (ref 1.7–7.7)
Neutrophils Relative %: 82 %

## 2015-08-12 LAB — CBC
HEMATOCRIT: 27.9 % — AB (ref 36.0–46.0)
Hemoglobin: 9.2 g/dL — ABNORMAL LOW (ref 12.0–15.0)
MCH: 34.1 pg — ABNORMAL HIGH (ref 26.0–34.0)
MCHC: 33 g/dL (ref 30.0–36.0)
MCV: 103.3 fL — AB (ref 78.0–100.0)
Platelets: 157 10*3/uL (ref 150–400)
RBC: 2.7 MIL/uL — ABNORMAL LOW (ref 3.87–5.11)
RDW: 17.1 % — AB (ref 11.5–15.5)
WBC: 4.8 10*3/uL (ref 4.0–10.5)

## 2015-08-12 LAB — GLUCOSE, CAPILLARY
GLUCOSE-CAPILLARY: 102 mg/dL — AB (ref 65–99)
GLUCOSE-CAPILLARY: 105 mg/dL — AB (ref 65–99)
Glucose-Capillary: 112 mg/dL — ABNORMAL HIGH (ref 65–99)
Glucose-Capillary: 120 mg/dL — ABNORMAL HIGH (ref 65–99)
Glucose-Capillary: 142 mg/dL — ABNORMAL HIGH (ref 65–99)
Glucose-Capillary: 160 mg/dL — ABNORMAL HIGH (ref 65–99)
Glucose-Capillary: 71 mg/dL (ref 65–99)

## 2015-08-12 LAB — TROPONIN I
Troponin I: 0.04 ng/mL — ABNORMAL HIGH (ref <0.031)
Troponin I: <0.03 ng/mL (ref <0.031)
Troponin I: <0.03 ng/mL (ref <0.031)

## 2015-08-12 LAB — PROTIME-INR
INR: 3.06 — AB (ref 0.00–1.49)
Prothrombin Time: 31.1 seconds — ABNORMAL HIGH (ref 11.6–15.2)

## 2015-08-12 MED ORDER — LIDOCAINE HCL (PF) 1 % IJ SOLN: 5.0000 mL | INTRAMUSCULAR | Status: DC | PRN

## 2015-08-12 MED ORDER — FENTANYL BOLUS VIA INFUSION
25.0000 ug | Freq: Once | INTRAVENOUS | Status: DC
  Filled 2015-08-12: qty 25

## 2015-08-12 MED ORDER — PHENOL 1.4 % MT LIQD
1.0000 | OROMUCOSAL | Status: DC | PRN
  Administered 2015-08-12 – 2015-08-14 (×2): 1 via OROMUCOSAL
  Filled 2015-08-12 (×3): qty 177

## 2015-08-12 MED ORDER — LIDOCAINE-PRILOCAINE 2.5-2.5 % EX CREA: 1.0000 "application " | TOPICAL_CREAM | CUTANEOUS | Status: DC | PRN

## 2015-08-12 MED ORDER — FENTANYL CITRATE (PF) 100 MCG/2ML IJ SOLN
25.0000 ug | Freq: Once | INTRAMUSCULAR | Status: AC
  Administered 2015-08-12: 25 ug via INTRAVENOUS

## 2015-08-12 MED ORDER — SODIUM CHLORIDE 0.9 % IV SOLN: 100.0000 mL | INTRAVENOUS | Status: DC | PRN

## 2015-08-12 MED ORDER — RENA-VITE PO TABS
1.0000 | ORAL_TABLET | Freq: Every day | ORAL | Status: DC
  Administered 2015-08-12 – 2015-08-14 (×3): 1 via ORAL
  Filled 2015-08-12 (×3): qty 1

## 2015-08-12 MED ORDER — FENTANYL CITRATE (PF) 100 MCG/2ML IJ SOLN
INTRAMUSCULAR | Status: AC
  Administered 2015-08-12: 25 ug via INTRAVENOUS
  Filled 2015-08-12: qty 2

## 2015-08-12 MED ORDER — PENTAFLUOROPROP-TETRAFLUOROETH EX AERO: 1.0000 "application " | INHALATION_SPRAY | CUTANEOUS | Status: DC | PRN

## 2015-08-12 MED ORDER — ALTEPLASE 2 MG IJ SOLR
2.0000 mg | Freq: Once | INTRAMUSCULAR | Status: DC | PRN
  Filled 2015-08-12: qty 2

## 2015-08-12 MED ORDER — CALCIUM ACETATE (PHOS BINDER) 667 MG PO CAPS
1334.0000 mg | ORAL_CAPSULE | Freq: Three times a day (TID) | ORAL | Status: DC
  Administered 2015-08-12 – 2015-08-15 (×9): 1334 mg via ORAL
  Filled 2015-08-12 (×10): qty 2

## 2015-08-12 MED ORDER — DIPHENHYDRAMINE HCL 50 MG/ML IJ SOLN
25.0000 mg | Freq: Four times a day (QID) | INTRAMUSCULAR | Status: DC | PRN
  Administered 2015-08-12 – 2015-08-15 (×11): 25 mg via INTRAVENOUS
  Filled 2015-08-12 (×11): qty 1

## 2015-08-12 MED ORDER — HEPARIN SODIUM (PORCINE) 1000 UNIT/ML DIALYSIS: 1000.0000 [IU] | INTRAMUSCULAR | Status: DC | PRN

## 2015-08-12 MED ORDER — WARFARIN SODIUM 2.5 MG PO TABS
2.5000 mg | ORAL_TABLET | Freq: Once | ORAL | Status: AC
  Administered 2015-08-12: 2.5 mg via ORAL
  Filled 2015-08-12: qty 1

## 2015-08-12 MED ORDER — KETOROLAC TROMETHAMINE 15 MG/ML IJ SOLN
15.0000 mg | Freq: Four times a day (QID) | INTRAMUSCULAR | Status: DC | PRN
  Administered 2015-08-12 – 2015-08-13 (×3): 15 mg via INTRAVENOUS
  Filled 2015-08-12 (×4): qty 1

## 2015-08-12 NOTE — Procedures (Signed)
Tolerating HD without hemodynamic issues. C/o some tightness in chest and  Throat discomfort. CXR ordered.  Jacob Cicero C

## 2015-08-12 NOTE — Progress Notes (Signed)
eLink Physician-Brief Progress Note Patient Name: Crystal Dunlap DOB: December 30, 1990 MRN: [id number]   Date of Service  08/12/2015  HPI/Events of Note  Hyperkalemia with dialysis planned for AM.  Increase in K from 5.5 to 6.1.  Had received 15 gms of kayexalate at 3pm on 2/12.  eICU Interventions  Plan: Additional kayexalate 30 gm po times one     Intervention Category Intermediate Interventions: Electrolyte abnormality - evaluation and management  Kron Everton 08/12/2015, 12:07 AM

## 2015-08-12 NOTE — Progress Notes (Addendum)
ANTICOAGULATION CONSULT NOTE - Consult  Pharmacy Consult for warfarin Indication: h/o  DVT/ PE  Allergies  Allergen Reactions  . Ferrlecit [Na Ferric Gluc Cplx In Sucrose] Anaphylaxis  . Cefazolin   . Cyclosporine     Loss of motor skills  . Lactose Intolerance (Gi)   . Latex   . Vancomycin Rash    Patient Measurements: Height: 5\' 3"  (160 cm) Weight: 110 lb 0.2 oz (49.9 kg) IBW/kg (Calculated) : 52.4   Vital Signs: Temp: 98.7 F (37.1 C) (02/13 1113) Temp Source: Tympanic (02/13 1113) BP: 114/80 mmHg (02/13 1113) Pulse Rate: 84 (02/13 1113)  Labs:  Recent Labs  08/10/15 1401 08/10/15 2052 08/11/15 0521 08/11/15 2203 08/12/15 0800  HGB 10.8*  --  9.0*  --  9.2*  DUPL SEE [id number]  HCT 33.8*  --  29.4*  --  27.9*  DUPL SEE [id number]  PLT 185  --  154  --  157  DUPL SEE [id number]  LABPROT 18.7*  --  22.1*  --  31.1*  INR 1.56*  --  1.95*  --  3.06*  HEPARINUNFRC  --  1.86* 0.31  --   --   CREATININE 9.13*  --  10.17* 11.72* 12.06*    Estimated Creatinine Clearance: 5.6 mL/min (by C-G formula based on Cr of 12.06).   Medical History: Past Medical History  Diagnosis Date  . Anemia   . ESRD (end stage renal disease) ([place]) Feb. 12, 2010    First date of Dialysis   . Secondary hyperparathyroidism ([place])   . Pulmonary emboli ([place])   . Hypertension   . GERD (gastroesophageal reflux disease)   . Hyperlipidemia   . 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 She was admitted on 2/10 after anaphylactic reaction to IV iron requiring intubation. Angioedema resolved.  On warfarin 7 mg daily at home. Unsure of compliance with low INR.   INR this AM increased to 3.06 today form 1.95 yesterday. INR is therapeutic today. INR trend 1.28>1.58>1.95>3.06 after warfarin 7.5mg , 10mg  and 10mg  since 08/09/15.  Hgb 12.2 on 12/10>10.8>9.0>9.2 today, pltc 157K. Anemia of CKD.  No bleeding noted.  Chest pain - reportedly has  been experiencing this on and off for years. PCCM doubts recurrent PE despite sub-therapeutic INR on admission.  PCCM checking STAT EKG, trending troponins and assessing serum drug panel to r/o cocaine abuse  Goal of Therapy:  INR 2-3 Monitor platelets by anticoagulation protocol: Yes   Plan:  Lower Warfarin dose to 2.5mg  tonight Daily INR, CBC Monitor for s/sx of bleeding  Nicole Cella, RPh Clinical Pharmacist Pager: [phone] 08/12/2015 12:06 PM

## 2015-08-12 NOTE — Progress Notes (Signed)
Results for Crystal Dunlap, Crystal Dunlap (MRN [id number]) as of 08/12/2015 00:16  Ref. Range 08/11/2015 22:03  Calcium Latest Ref Range: 8.9-10.3 mg/dL 6.2 (LL)  MD notified

## 2015-08-12 NOTE — Progress Notes (Signed)
PCCM Interval Progress Note  Asked by Dr. Algis Liming to assess pt at bedside post HD.   25 y.o. F admitted 02/10 after anaphylactic reaction to IV iron requiring intubation.  She was liberated from ventilator 02/11 and steroids / pepcid continued.  She was transferred out of the ICU and onto [facility] service 02/12.  On AM 02/13, she complained of chest pain and throat pain as well as sensation of throat swelling.  She reports that she has had hx of chest pain "on and off for several years".  Current pain is intermittent, non-exertional, not reproducible.  No radiation, diaphoresis, lightheadedness, LE pain / edema, hemoptysis.  Of note, she does have hx of PE for which she is on coumadin.  INR this admission was sub-therapeutic (1.28 on admit), now up to 3.06.  Despite sub-therapeutic, doubt recurrent PE.  Throat pain is constant.  Sensation of throat swelling is new, not quite as bad as when she was admitted.  Vitals remain stable and she has SpO2 100% on RA.   VITAL SIGNS: Temp:  [97.4 F (36.3 C)-98.1 F (36.7 C)] 98 F (36.7 C) (02/13 0730) Pulse Rate:  [66-95] 93 (02/13 0930) Resp:  [17-20] 20 (02/13 0730) BP: (87-125)/(62-92) 98/71 mmHg (02/13 0930) SpO2:  [90 %-96 %] 90 % (02/13 0730) Weight:  [50.1 kg (110 lb 7.2 oz)-51.1 kg (112 lb 10.5 oz)] 51.1 kg (112 lb 10.5 oz) (02/13 0730)  PHYSICAL EXAM: General:  Young AA female, eating breakfast, in NAD. Neuro:  A&O x 3, no focal deficits. HEENT:  Oropharynx widely patent from external exam.  No tongue or lip edema.  No upper airway stridor. No secretions / drooling. Cardiovascular:  RRR, no M/R/G. Lungs:  Reps even and unlabored.  Clear bilaterally. Abdomen:  BS x 4, soft, NT/ND.  Musculoskeletal:  No edema.   IMPRESSION / PLAN:  Acute hypoxic respiratory failure - in the setting of angioedema following IV iron administration.  Now resolved. Angioedema - due to above; now resolved. Throat pain - reports sensation of throat swelling.  Suspect this is primarily post intubation discomfort. Hx PE (on coumadin) - doubt recurrent PE despite sub-therapeutic INR on admission. Plan: Continue to monitor (is currently 100% on room air). Continue pepcid, steroids. Add low dose benadryl (she feels this helped in past). No indication for transfer to ICU emergently. Defer CTA at this time. Chloraseptic throat spray PRN.  Chest pain - reportedly has been experiencing this on and off for years. Plan: STAT EKG. Trend troponins. Assess serum drug panel to r/o cocaine abuse.  Rest per primary team.   CC time:  30 minutes.   Montey Hora, [facility] Pgr: [phone]  or [phone] 08/12/2015, 11:14 AM

## 2015-08-12 NOTE — Progress Notes (Signed)
  [facility] Progress Note    Assessment/ Plan:   ESRD: Patient typically followed at [facility] and receives HD MWF. Last HD as scheduled 2/10 - HD this AM  Anemia of CKD: Hgb 12.2> 10.8 > 9.0 - no further IV iron - given anaphylaxis - Consider starting Aranesp - Continue to monitor  Hyperkalemia: K 3.4 on admission > 5.3 > 6.2 >5.5 s/p Kayexalate - Will correct with HD this AM  Hypocalcemia, hyperphosphatemia: Ca 7.9 > 7.5 > 6.4 > 6.2. Phos 6.6 - Resume Phoslo and rena-vit - check PTH  Subjective:   Reports she is uncomfortable this AM   Objective:   BP 114/82 mmHg  Pulse 77  Temp(Src) 97.4 F (36.3 C) (Oral)  Resp 20  Ht 5\' 3"  (1.6 m)  Wt 110 lb 7.2 oz (50.1 kg)  BMI 19.57 kg/m2  SpO2 96%  Intake/Output Summary (Last 24 hours) at 08/12/15 0859 Last data filed at 08/11/15 1655  Gross per 24 hour  Intake    353 ml  Output      0 ml  Net    353 ml   Weight change:   Physical Exam: Gen: laying comfortably in HD CVS: RRR, noi m/r/g Resp: CTAB, no w/r/c. Normal WOB Abd: Soft, NTND Ext: No LE edema, tunneled catheter in place on L chest  Imaging: No results found.  Labs: BMET  Recent Labs Lab 08/09/15 1532 08/10/15 1401 08/11/15 0521 08/11/15 2203 08/12/15 0800  NA 141 139 141 140 140  K 3.4* 5.3* 6.2* 5.5* 4.1  CL 103 104 105 102 100*  CO2 24 18* 18* 17* 18*  GLUCOSE 189* 114* 119* 143* 101*  BUN 21* 47* 65* 81* 84*  CREATININE 6.07* 9.13* 10.17* 11.72* 12.06*  CALCIUM 7.9* 7.5* 6.4* 6.2* 5.7*  PHOS  --   --  6.6*  --  6.6*   CBC  Recent Labs Lab 08/09/15 1532 08/10/15 1401 08/11/15 0521 08/12/15 0800  WBC 8.3 9.2 6.4 DUPL SEE [id number]  NEUTROABS 7.1  --   --  3.8  HGB 12.2 10.8* 9.0* DUPL SEE M43220  HCT 39.2 33.8* 29.4* DUPL SEE M43220  MCV 103.4* 104.6* 103.5* DUPL SEE [id number]  PLT 204 185 154 DUPL SEE [id number]    Medications:    . antiseptic oral rinse  7 mL Mouth Rinse BID  . antiseptic oral rinse  7 mL Mouth Rinse 10  times per day  . calcium acetate  1,334 mg Oral TID WC  . famotidine (PEPCID) IV  20 mg Intravenous Q24H  . insulin aspart  0-9 Units Subcutaneous 6 times per day  . multivitamin  1 tablet Oral QHS  . predniSONE  20 mg Oral BID WC   Followed by  . [START ON 08/13/2015] predniSONE  10 mg Oral BID WC   Followed by  . [START ON 08/16/2015] predniSONE  10 mg Oral Q breakfast  . Warfarin - Pharmacist Dosing Inpatient   Does not apply [id number]      Virginia Crews, MD, MPH PGY-2,  [facility] 08/12/2015 8:59 AM   Renal Attending: Hypocalcemia eval in progress.  Will check PTH and d levels. Enes Wegener C

## 2015-08-12 NOTE — Progress Notes (Signed)
Patient signed off treatment 30 mins early ama  Ran 3 hours of 3.5 hour tx. Did not meet her UF goal . she states she feels as though her throat is closing up. She appears in no respiratory distress. O 2 sats 100% on room air.  Dr Florene Glen notified and Dr. Lissa Merlin notified and Dr. Exie Parody in to assess patient. Report given to floor nurse. No orders given to this Probation officer.

## 2015-08-12 NOTE — Progress Notes (Signed)
PROGRESS NOTE    Crystal Dunlap  [id number]  DOB: 1990/11/21  DOA: 08/09/2015 PCP: No primary care provider on file. Outpatient Specialists:   Hospital course: 25 year old female with history of ESRD secondary to focal segmental glomerulosclerosis, on HD MWF, failed renal transplant, followed at [facility] outpatient dialysis, iron deficiency anemia, developed angioedema after a second dose of Ferrlecit on 2/10, transferred to med [facility], intubated and started on epinephrine drip and transferred to [facility] ICU. After patient was stabilized, patient transferred to floor and [place] on 2/13.  Assessment & Plan:   1. Acute hypoxic respiratory failure secondary to anaphylaxis with angioedema: Acute respiratory failure resolved. Extubated 2/12.  2. Anaphylaxis with angioedema secondary to IV Ferrlecit : Patient was treated initially with IV epinephrine drip, Solu-Medrol and Benadryl. Angioedema resolved. This morning while on dialysis, patient complained of throat swelling again. CCM reassessed: Continue IV Pepcid, Benadryl when necessary, oral prednisone. 3. Essential hypertension 4. Hyperlipidemia 5. 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 ESRD, status post failed renal transplant, on MWF HD: Dialysis per nephrology. 7. Iron deficiency anemia/microcytic anemia: Stable 8. Steroid induced hyperglycemia: Controlled 9. Throat pain: Reported on 2/13. No features suggestive of recurrent angioedema. Likely discomfort from recent intubation. Management per CCM. 10. Atypical and chronic chest pain: Patient states that she has had chest pain for a long time. Low index of suspicion for pulmonary embolism.? Musculoskeletal. Minimally elevated troponin could be secondary to renal insufficiency. Chest x-ray stable without acute findings. 11. Acute encephalopathy: Secondary to anaphylaxis. Resolved 12. Hyperkalemia: Resolved.  13. Hypocalcemia, Hyperphosphatemia: Management per  nephrology. Presuming PhosLo and Rena Vite   DVT prophylaxis: On Coumadin anticoagulation. INR 1.9  Code Status: Full Family Communication: None at bedside Disposition Plan: DC home when medically stable, possibly in the next 1-2 days.    Consultants:  CCM  Nephrology   Procedures:  HD   Extubated 2/12  Antimicrobials:  None   Subjective: Seen this morning while on HD and then soon after she was taken off hemodialysis. Earlier this morning complained of midsternal chest discomfort, chronic and intermittent, 8/10 in severity, nonradiating, not associated with dyspnea, not pleuritic in nature and relieved after pain medications. Subsequently complained of throat swelling/pain but no dyspnea.   Objective: Filed Vitals:   08/12/15 0930 08/12/15 1000 08/12/15 1035 08/12/15 1113  BP: 98/71 103/71 108/75 114/80  Pulse: 93 95 88 84  Temp:   98.2 F (36.8 C) 98.7 F (37.1 C)  TempSrc:   Oral Tympanic  Resp:   20 20  Height:      Weight:   49.9 kg (110 lb 0.2 oz)   SpO2:   100% 97%    Intake/Output Summary (Last 24 hours) at 08/12/15 1800 Last data filed at 08/12/15 1500  Gross per 24 hour  Intake    360 ml  Output   1316 ml  Net   -956 ml   Filed Weights   08/11/15 1644 08/12/15 0730 08/12/15 1035  Weight: 50.1 kg (110 lb 7.2 oz) 51.1 kg (112 lb 10.5 oz) 49.9 kg (110 lb 0.2 oz)    Exam:  General exam: pleasant young female lying comfortably supine in bed  ENT: No acute findings. No stridor.  Respiratory system: Clear. No increased work of breathing. Cardiovascular system: S1 & S2 heard, RRR. No JVD, murmurs, gallops, clicks or pedal edema. telemetry: Sinus rhythm.  Gastrointestinal system: Abdomen is nondistended, soft and nontender. Normal bowel sounds heard. Central nervous system: Alert  and oriented. No focal neurological deficits. Extremities: Symmetric 5 x 5 power.   Data Reviewed: Basic Metabolic Panel:  Recent Labs Lab 08/09/15 1532 08/10/15 1401  08/11/15 0521 08/11/15 2203 08/12/15 0800  NA 141 139 141 140 140  K 3.4* 5.3* 6.2* 5.5* 4.1  CL 103 104 105 102 100*  CO2 24 18* 18* 17* 18*  GLUCOSE 189* 114* 119* 143* 101*  BUN 21* 47* 65* 81* 84*  CREATININE 6.07* 9.13* 10.17* 11.72* 12.06*  CALCIUM 7.9* 7.5* 6.4* 6.2* 5.7*  MG 1.9  --   --   --   --   PHOS  --   --  6.6*  --  6.6*   Liver Function Tests:  Recent Labs Lab 08/09/15 1532 08/11/15 0521 08/12/15 0800  AST 30  --   --   ALT 23  --   --   ALKPHOS 127*  --   --   BILITOT 0.5  --   --   PROT 8.3*  --   --   ALBUMIN 4.3 4.0 4.0   No results for input(s): LIPASE, AMYLASE in the last 168 hours. No results for input(s): AMMONIA in the last 168 hours. CBC:  Recent Labs Lab 08/09/15 1532 08/10/15 1401 08/11/15 0521 08/12/15 0800  WBC 8.3 9.2 6.4 4.8  DUPL SEE [id number]  NEUTROABS 7.1  --   --  3.8  HGB 12.2 10.8* 9.0* 9.2*  DUPL SEE [id number]  HCT 39.2 33.8* 29.4* 27.9*  DUPL SEE M43220  MCV 103.4* 104.6* 103.5* 103.3*  DUPL SEE [id number]  PLT 204 185 154 157  DUPL SEE [id number]   Cardiac Enzymes:  Recent Labs Lab 08/12/15 1123  TROPONINI 0.04*   BNP (last 3 results) No results for input(s): PROBNP in the last 8760 hours. CBG:  Recent Labs Lab 08/11/15 2156 08/12/15 0051 08/12/15 0439 08/12/15 1125 08/12/15 1655  GLUCAP 135* 142* 120* 71 105*    Recent Results (from the past 240 hour(s))  MRSA PCR Screening     Status: None   Collection Time: 08/09/15  1:44 PM  Result Value Ref Range Status   MRSA by PCR NEGATIVE NEGATIVE Final    Comment:        The GeneXpert MRSA Assay (FDA approved for NASAL specimens only), is one component of a comprehensive MRSA colonization surveillance program. It is not intended to diagnose MRSA infection nor to guide or monitor treatment for MRSA infections.          Studies: Dg Chest Port 1 View  08/12/2015  CLINICAL DATA:  Dyspnea, post dialysis. EXAM: PORTABLE CHEST 1 VIEW COMPARISON:  08/10/2015  chest radiograph. FINDINGS: Left internal jugular central venous catheter terminates near the cavoatrial junction. Vascular stent graft overlies the right mediastinum. Surgical clips are seen in the medial left upper quadrant of the abdomen. Stable cardiomediastinal silhouette with top-normal heart size. No pneumothorax. Stable minimal blunting of both costophrenic angles, suggesting trace bilateral pleural effusions. Lungs appear clear, with no acute consolidative airspace disease and no pulmonary edema. IMPRESSION: 1. Stable minimal blunting of both costophrenic angles, suggesting stable trace bilateral pleural effusions. 2. Otherwise no active cardiopulmonary disease.  No pulmonary edema. Electronically Signed   By: Ilona Sorrel M.D.   On: 08/12/2015 12:08        Scheduled Meds: . antiseptic oral rinse  7 mL Mouth Rinse BID  . antiseptic oral rinse  7 mL Mouth Rinse 10 times per day  . calcium acetate  1,334 mg  Oral TID WC  . famotidine (PEPCID) IV  20 mg Intravenous Q24H  . insulin aspart  0-9 Units Subcutaneous 6 times per day  . multivitamin  1 tablet Oral QHS  . predniSONE  20 mg Oral BID WC   Followed by  . [START ON 08/13/2015] predniSONE  10 mg Oral BID WC   Followed by  . [START ON 08/16/2015] predniSONE  10 mg Oral Q breakfast  . warfarin  2.5 mg Oral ONCE-1800  . Warfarin - Pharmacist Dosing Inpatient   Does not apply q1800   Continuous Infusions:   Principal Problem:   Anaphylaxis Active Problems:   Allergic angioedema   ESRD on dialysis ([place])   Acute respiratory failure with hypoxia ([facility])   Respiratory failure ([place])    Time spent: 40 minutes.    Vernell Leep, MD, FACP, FHM. [facility] Pager [phone]  If 7PM-7AM, please contact night-coverage [email or web address] Password [email or web address] 08/12/2015, 6:00 PM    LOS: 3 days

## 2015-08-13 DIAGNOSIS — I10 Essential (primary) hypertension: Secondary | ICD-10-CM

## 2015-08-13 DIAGNOSIS — R079 Chest pain, unspecified: Secondary | ICD-10-CM

## 2015-08-13 DIAGNOSIS — T782XXD Anaphylactic shock, unspecified, subsequent encounter: Secondary | ICD-10-CM

## 2015-08-13 LAB — CBC
HEMATOCRIT: 28.1 % — AB (ref 36.0–46.0)
HEMOGLOBIN: 9.3 g/dL — AB (ref 12.0–15.0)
MCH: 33.7 pg (ref 26.0–34.0)
MCHC: 33.1 g/dL (ref 30.0–36.0)
MCV: 101.8 fL — ABNORMAL HIGH (ref 78.0–100.0)
Platelets: 143 10*3/uL — ABNORMAL LOW (ref 150–400)
RBC: 2.76 MIL/uL — AB (ref 3.87–5.11)
RDW: 16.7 % — ABNORMAL HIGH (ref 11.5–15.5)
WBC: 3.5 10*3/uL — AB (ref 4.0–10.5)

## 2015-08-13 LAB — RENAL FUNCTION PANEL
ALBUMIN: 4 g/dL (ref 3.5–5.0)
ANION GAP: 18 — AB (ref 5–15)
BUN: 46 mg/dL — ABNORMAL HIGH (ref 6–20)
CALCIUM: 7 mg/dL — AB (ref 8.9–10.3)
CO2: 23 mmol/L (ref 22–32)
Chloride: 98 mmol/L — ABNORMAL LOW (ref 101–111)
Creatinine, Ser: 8.67 mg/dL — ABNORMAL HIGH (ref 0.44–1.00)
GFR, EST AFRICAN AMERICAN: 7 mL/min — AB (ref >60)
GFR, EST NON AFRICAN AMERICAN: 6 mL/min — AB (ref >60)
GLUCOSE: 94 mg/dL (ref 65–99)
PHOSPHORUS: 4.6 mg/dL (ref 2.5–4.6)
Potassium: 3.1 mmol/L — ABNORMAL LOW (ref 3.5–5.1)
SODIUM: 139 mmol/L (ref 135–145)

## 2015-08-13 LAB — GLUCOSE, CAPILLARY
GLUCOSE-CAPILLARY: 79 mg/dL (ref 65–99)
GLUCOSE-CAPILLARY: 91 mg/dL (ref 65–99)
GLUCOSE-CAPILLARY: 189 mg/dL — AB (ref 65–99)
Glucose-Capillary: 107 mg/dL — ABNORMAL HIGH (ref 65–99)

## 2015-08-13 LAB — TROPONIN I

## 2015-08-13 LAB — PROTIME-INR
INR: 2.02 — ABNORMAL HIGH (ref 0.00–1.49)
PROTHROMBIN TIME: 22.7 s — AB (ref 11.6–15.2)

## 2015-08-13 LAB — HEPATITIS B SURFACE ANTIGEN: Hepatitis B Surface Ag: NEGATIVE

## 2015-08-13 LAB — HEPATITIS B SURFACE ANTIBODY,QUALITATIVE: HEP B S AB: REACTIVE

## 2015-08-13 LAB — HEPATITIS B CORE ANTIBODY, TOTAL: HEP B C TOTAL AB: NEGATIVE

## 2015-08-13 MED ORDER — PANTOPRAZOLE SODIUM 40 MG PO TBEC
40.0000 mg | DELAYED_RELEASE_TABLET | Freq: Every day | ORAL | Status: DC
  Administered 2015-08-13 – 2015-08-15 (×3): 40 mg via ORAL
  Filled 2015-08-13 (×3): qty 1

## 2015-08-13 MED ORDER — CARVEDILOL 6.25 MG PO TABS
6.2500 mg | ORAL_TABLET | Freq: Two times a day (BID) | ORAL | Status: DC
  Administered 2015-08-13 – 2015-08-15 (×4): 6.25 mg via ORAL
  Filled 2015-08-13 (×4): qty 1

## 2015-08-13 MED ORDER — WARFARIN SODIUM 7.5 MG PO TABS
7.5000 mg | ORAL_TABLET | Freq: Once | ORAL | Status: AC
  Administered 2015-08-13: 7.5 mg via ORAL
  Filled 2015-08-13: qty 1

## 2015-08-13 MED ORDER — ROSUVASTATIN CALCIUM 10 MG PO TABS
10.0000 mg | ORAL_TABLET | Freq: Every day | ORAL | Status: DC
  Administered 2015-08-13 – 2015-08-15 (×3): 10 mg via ORAL
  Filled 2015-08-13 (×3): qty 1

## 2015-08-13 MED ORDER — ACETAMINOPHEN 325 MG PO TABS
650.0000 mg | ORAL_TABLET | Freq: Four times a day (QID) | ORAL | Status: DC | PRN
Start: 2015-08-13 — End: 2015-08-15

## 2015-08-13 MED ORDER — FENTANYL CITRATE (PF) 100 MCG/2ML IJ SOLN
25.0000 ug | INTRAMUSCULAR | Status: DC | PRN
  Administered 2015-08-13 – 2015-08-15 (×11): 25 ug via INTRAVENOUS
  Filled 2015-08-13 (×10): qty 2

## 2015-08-13 NOTE — Progress Notes (Signed)
PATIENT DETAILS Name: Crystal Dunlap Age: 25 y.o. Sex: female Date of Birth: 09-21-1990 Admit Date: 08/09/2015 Admitting Physician Brand Males, MD PCP:No primary care provider on file.  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 After patient was stabilized, patient transferred to floor and [place] on 2/13.  Subjective: Some mild chest pain this morning-per patient this is a chronic issue for her. Denies any shortness of breath, throat tightness or lip swelling.  Assessment/Plan: Acute hypoxic respiratory failure secondary to anaphylaxis with angioedema: Acute respiratory failure resolved. Extubated 2/12. Doing well on room air.  Anaphylaxis with angioedema secondary to IV Ferrlecit : Patient was treated initially with IV epinephrine drip, Solu-Medrol and Benadryl. Angioedema resolved. She complained of some mild throat discomfort on 2/13-she was evaluated by PCCM. Suspicion was for throat discomfort postintubation. Currently doing well without any signs of delayed anaphylaxis. We'll continue tapering prednisone, Pepcid and as needed Benadryl. Suspect needs to be observed for another 24 hours prior to discharge.  History of chronic chest pain: Per patient, she has a long-standing history of intermittent chest pain for years. EKG/troponins negative, on Coumadin with therapeutic INR. Doubt any workup is required. Continue Supportive care.  History of pulmonary embolism: INR therapeutic-continue Coumadin.  History of end-stage renal disease: Nephrology following, continue HD- Monday Wednesday Friday. History of FSGS with failed transplant  2.  Hypertension: Resume Coreg-but at 6.25 mg twice a day-follow.  Dyslipidemia: Resume Crestor.  Acute encephalopathy: Secondary to anaphylaxis, resolved  Anemia: Secondary to see daily,Aranesp prerenal. Unable to use iron given anaphylaxis.  Hypokalemia: Resolved.  Hypocalcemia/hyperphosphatemia: Per nephrology.  Disposition: Remain inpatient  Antimicrobial agents  See below  Anti-infectives    None      DVT Prophylaxis: Coumadin  Code Status: Full code   Family Communication None  Procedures:  HD   Extubated 2/12   CONSULTS:  pulmonary/intensive care and nephrology  Time spent 25  minutes-Greater than 50% of this time was spent in counseling, explanation of diagnosis, planning of further management, and coordination of care.  MEDICATIONS: Scheduled Meds: . antiseptic oral rinse  7 mL Mouth Rinse BID  . antiseptic oral rinse  7 mL Mouth Rinse 10 times per day  . calcium acetate  1,334 mg Oral TID WC  . famotidine (PEPCID) IV  20 mg Intravenous Q24H  . insulin aspart  0-9 Units Subcutaneous 6 times per day  . multivitamin  1 tablet Oral QHS  . pantoprazole  40 mg Oral Daily  . predniSONE  10 mg Oral BID WC   Followed by  . [START ON 08/16/2015] predniSONE  10 mg Oral Q breakfast  . Warfarin - Pharmacist Dosing Inpatient   Does not apply q1800   Continuous Infusions:  PRN Meds:.sodium chloride, sodium chloride, alteplase, diphenhydrAMINE, fentaNYL (SUBLIMAZE) injection, heparin, ketorolac, ketorolac, lidocaine (PF), lidocaine-prilocaine, pentafluoroprop-tetrafluoroeth, phenol    PHYSICAL EXAM: Vital signs in last 24 hours: Filed Vitals:   08/12/15 1113 08/12/15 2222 08/13/15 0410 08/13/15 0802  BP: 114/80 107/73 131/93 141/103  Pulse: 84 98 80 66  Temp: 98.7 F (37.1 C) 98.3 F (36.8 C) 98.5 F (36.9 C) 98 F (36.7 C)  TempSrc: Tympanic Oral Oral Oral  Resp: 20 19 20 19   Height:      Weight:  50.1 kg (110 lb 7.2 oz)    SpO2: 97% 98%  91% 96%    Weight change: 1 kg (2 lb 3.3 oz) Filed Weights   08/12/15 0730 08/12/15 1035 08/12/15 2222  Weight: 51.1 kg (112 lb 10.5 oz) 49.9 kg (110 lb 0.2 oz) 50.1 kg (110 lb 7.2 oz)   Body mass index is 19.57 kg/(m^2).   Gen Exam: Awake and alert with clear speech.   Neck: Supple, No JVD.   Chest: B/L Clear.   CVS: S1 S2 Regular, no murmurs.  Abdomen: soft, BS +, extensive midline laparotomy scar, non distended.  Extremities: no edema, lower extremities warm to touch. Neurologic: Non Focal.   Skin: No Rash.   Wounds: N/A.   Intake/Output from previous day:  Intake/Output Summary (Last 24 hours) at 08/13/15 1313 Last data filed at 08/13/15 0803  Gross per 24 hour  Intake    720 ml  Output      0 ml  Net    720 ml     LAB RESULTS: CBC  Recent Labs Lab 08/09/15 1532 08/10/15 1401 08/11/15 0521 08/12/15 0800 08/13/15 1029  WBC 8.3 9.2 6.4 4.8  DUPL SEE M43220 3.5*  HGB 12.2 10.8* 9.0* 9.2*  DUPL SEE M43220 9.3*  HCT 39.2 33.8* 29.4* 27.9*  DUPL SEE M43220 28.1*  PLT 204 185 154 157  DUPL SEE M43220 143*  MCV 103.4* 104.6* 103.5* 103.3*  DUPL SEE M43220 101.8*  MCH 32.2 33.4 31.7 34.1*  DUPL SEE M43220 33.7  MCHC 31.1 32.0 30.6 33.0  DUPL SEE M43220 33.1  RDW 16.1* 16.6* 16.6* 17.1*  DUPL SEE M43220 16.7*  LYMPHSABS 0.9  --   --  0.6*  --   MONOABS 0.2  --   --  0.3  --   EOSABS 0.1  --   --  0.0  --   BASOSABS 0.0  --   --  0.0  --     Chemistries   Recent Labs Lab 08/09/15 1532 08/10/15 1401 08/11/15 0521 08/11/15 2203 08/12/15 0800 08/13/15 1029  NA 141 139 141 140 140 139  K 3.4* 5.3* 6.2* 5.5* 4.1 3.1*  CL 103 104 105 102 100* 98*  CO2 24 18* 18* 17* 18* 23  GLUCOSE 189* 114* 119* 143* 101* 94  BUN 21* 47* 65* 81* 84* 46*  CREATININE 6.07* 9.13* 10.17* 11.72* 12.06* 8.67*  CALCIUM 7.9* 7.5* 6.4* 6.2* 5.7* 7.0*  MG 1.9  --   --   --   --   --     CBG:  Recent Labs Lab 08/12/15 1125 08/12/15 1655 08/12/15 2218 08/13/15 0802  08/13/15 1145  GLUCAP 71 105* 160* 79 91    GFR Estimated Creatinine Clearance: 7.8 mL/min (by C-G formula based on Cr of 8.67).  Coagulation profile  Recent Labs Lab 08/09/15 1532 08/10/15 1401 08/11/15 0521 08/12/15 0800 08/13/15 1029  INR 1.28 1.56* 1.95* 3.06* 2.02*    Cardiac Enzymes  Recent Labs Lab 08/12/15 1656 08/12/15 2244 08/13/15 1029  TROPONINI <0.03 <0.03 <0.03    Invalid input(s): POCBNP No results for input(s): DDIMER in the last 72 hours. No results for input(s): HGBA1C in the last 72 hours. No results for input(s): CHOL, HDL, LDLCALC, TRIG, CHOLHDL, LDLDIRECT in the last 72 hours. No results for input(s): TSH, T4TOTAL, T3FREE, THYROIDAB in the last 72 hours.  Invalid input(s): FREET3 No results for input(s): VITAMINB12, FOLATE, FERRITIN, TIBC, IRON, RETICCTPCT in the last 72 hours. No results for input(s): LIPASE, AMYLASE in the last 72 hours.  Urine Studies No results for input(s): UHGB, CRYS in the last 72 hours.  Invalid input(s): UACOL, UAPR, USPG, UPH, UTP, UGL, UKET, UBIL, UNIT, UROB, ULEU, UEPI, UWBC, URBC, UBAC, CAST, UCOM, BILUA  MICROBIOLOGY: Recent Results (from the past 240 hour(s))  MRSA PCR Screening     Status: None   Collection Time: 08/09/15  1:44 PM  Result Value Ref Range Status   MRSA by PCR NEGATIVE NEGATIVE Final    Comment:        The GeneXpert MRSA Assay (FDA approved for NASAL specimens only), is one component of a comprehensive MRSA colonization surveillance program. It is not intended to diagnose MRSA infection nor to guide or monitor treatment for MRSA infections.     RADIOLOGY STUDIES/RESULTS: Dg Chest Port 1 View  08/12/2015  CLINICAL DATA:  Dyspnea, post dialysis. EXAM: PORTABLE CHEST 1 VIEW COMPARISON:  08/10/2015 chest radiograph. FINDINGS: Left internal jugular central venous catheter terminates near the cavoatrial junction. Vascular stent graft overlies the right mediastinum. Surgical clips are seen  in the medial left upper quadrant of the abdomen. Stable cardiomediastinal silhouette with top-normal heart size. No pneumothorax. Stable minimal blunting of both costophrenic angles, suggesting trace bilateral pleural effusions. Lungs appear clear, with no acute consolidative airspace disease and no pulmonary edema. IMPRESSION: 1. Stable minimal blunting of both costophrenic angles, suggesting stable trace bilateral pleural effusions. 2. Otherwise no active cardiopulmonary disease.  No pulmonary edema. Electronically Signed   By: Ilona Sorrel M.D.   On: 08/12/2015 12:08   Dg Chest Port 1 View  08/10/2015  CLINICAL DATA:  Respiratory failure and shortness of breath EXAM: PORTABLE CHEST 1 VIEW COMPARISON:  Yesterday FINDINGS: Endotracheal tube tip between the clavicular heads and carina. The orogastric tube reaches the stomach at least. Dialysis catheter from the left tip at the upper right atrium. Small pleural effusions. There is no edema, consolidation, or pneumothorax. Normal heart size. IMPRESSION: 1. Stable positioning of tubes and dialysis catheter. 2. Small pleural effusions. Electronically Signed   By: Monte Fantasia M.D.   On: 08/10/2015 07:35   Dg Chest Portable 1 View  08/09/2015  CLINICAL DATA:  Nonresponsive Chronic kidney failure. EXAM: PORTABLE CHEST 1 VIEW COMPARISON:  None. FINDINGS: Nasogastric tube with the tip projecting over the body of the stomach. Dual-lumen central venous catheter with the tip projecting over the cavoatrial junction. Endotracheal tube with the tip 2.9 cm above the carina. There is no focal parenchymal opacity. There is no pleural effusion or pneumothorax. The heart and mediastinal contours are unremarkable. The osseous structures are unremarkable. IMPRESSION: Nasogastric tube with the tip projecting over the body of the stomach. Dual-lumen central venous catheter with the tip projecting over the cavoatrial junction. Endotracheal tube with the tip 2.9 cm above the  carina. Electronically Signed   By: Kathreen Devoid   On: 08/09/2015 12:40    Oren Binet, MD  [facility] Pager:[phone]  If 7PM-7AM, please contact night-coverage [email or web address] Password [email or web address] 08/13/2015, 1:13 PM   LOS: 4 days

## 2015-08-13 NOTE — Progress Notes (Signed)
[facility] Progress Note    Assessment/ Plan:   ESRD: Patient typically followed at [facility] and receives HD MWF. Last HD 2/13  Anemia of CKD: Hgb 12.2> 10.8 > 9.0 - no further IV iron - given anaphylaxis - Consider starting Aranesp with next HD treatment - Continue to monitor  Hyperkalemia: K 3.4 on admission > 5.3 > 6.2 >5.5 > pending this AM  Hypocalcemia, hyperphosphatemia: Ca 7.9 > 7.5 > 6.4 > 6.2. Phos 6.6 - Resume Phoslo and rena-vit - check PTH  Subjective:   Reports she is feeling better, no SOB, intermittent CP continues   Objective:   BP 141/103 mmHg  Pulse 66  Temp(Src) 98 F (36.7 C) (Oral)  Resp 19  Ht 5\' 3"  (1.6 m)  Wt 110 lb 7.2 oz (50.1 kg)  BMI 19.57 kg/m2  SpO2 96%  Intake/Output Summary (Last 24 hours) at 08/13/15 0808 Last data filed at 08/13/15 0803  Gross per 24 hour  Intake    960 ml  Output   1316 ml  Net   -356 ml   Weight change: 2 lb 3.3 oz (1 kg)  Physical Exam: Gen: laying comfortably, NAD CVS: RRR, no m/r/g Resp: CTAB, no w/r/c. Normal WOB Abd: Soft, NTND Ext: No LE edema, tunneled catheter in place on L chest  Imaging: Dg Chest Port 1 View  08/12/2015  CLINICAL DATA:  Dyspnea, post dialysis. EXAM: PORTABLE CHEST 1 VIEW COMPARISON:  08/10/2015 chest radiograph. FINDINGS: Left internal jugular central venous catheter terminates near the cavoatrial junction. Vascular stent graft overlies the right mediastinum. Surgical clips are seen in the medial left upper quadrant of the abdomen. Stable cardiomediastinal silhouette with top-normal heart size. No pneumothorax. Stable minimal blunting of both costophrenic angles, suggesting trace bilateral pleural effusions. Lungs appear clear, with no acute consolidative airspace disease and no pulmonary edema. IMPRESSION: 1. Stable minimal blunting of both costophrenic angles, suggesting stable trace bilateral pleural effusions. 2. Otherwise no active cardiopulmonary disease.  No pulmonary  edema. Electronically Signed   By: Ilona Sorrel M.D.   On: 08/12/2015 12:08    Labs: BMET  Recent Labs Lab 08/09/15 1532 08/10/15 1401 08/11/15 0521 08/11/15 2203 08/12/15 0800  NA 141 139 141 140 140  K 3.4* 5.3* 6.2* 5.5* 4.1  CL 103 104 105 102 100*  CO2 24 18* 18* 17* 18*  GLUCOSE 189* 114* 119* 143* 101*  BUN 21* 47* 65* 81* 84*  CREATININE 6.07* 9.13* 10.17* 11.72* 12.06*  CALCIUM 7.9* 7.5* 6.4* 6.2* 5.7*  PHOS  --   --  6.6*  --  6.6*   CBC  Recent Labs Lab 08/09/15 1532 08/10/15 1401 08/11/15 0521 08/12/15 0800  WBC 8.3 9.2 6.4 4.8  DUPL SEE M43220  NEUTROABS 7.1  --   --  3.8  HGB 12.2 10.8* 9.0* 9.2*  DUPL SEE M43220  HCT 39.2 33.8* 29.4* 27.9*  DUPL SEE M43220  MCV 103.4* 104.6* 103.5* 103.3*  DUPL SEE M43220  PLT 204 185 154 157  DUPL SEE M43220    Medications:    . antiseptic oral rinse  7 mL Mouth Rinse BID  . antiseptic oral rinse  7 mL Mouth Rinse 10 times per day  . calcium acetate  1,334 mg Oral TID WC  . famotidine (PEPCID) IV  20 mg Intravenous Q24H  . insulin aspart  0-9 Units Subcutaneous 6 times per day  . multivitamin  1 tablet Oral QHS  . predniSONE  20 mg Oral BID  WC   Followed by  . predniSONE  10 mg Oral BID WC   Followed by  . [START ON 08/16/2015] predniSONE  10 mg Oral Q breakfast  . Warfarin - Pharmacist Dosing Inpatient   Does not apply [id number]      Virginia Crews, MD, MPH PGY-2,  [facility] 08/13/2015 8:08 AM   Renal Attending; She appears stable with no new dialysis issues.  I agree with note as articulated above.  Shaleena Crusoe C

## 2015-08-13 NOTE — Progress Notes (Signed)
ANTICOAGULATION CONSULT NOTE - follow up   Pharmacy Consult for warfarin Indication: h/o  DVT/ PE  Allergies  Allergen Reactions  . Ferrlecit [Na Ferric Gluc Cplx In Sucrose] Anaphylaxis  . Cefazolin   . Cyclosporine     Loss of motor skills  . Lactose Intolerance (Gi)   . Latex   . Vancomycin Rash    Patient Measurements: Height: 5\' 3"  (160 cm) Weight: 110 lb 7.2 oz (50.1 kg) IBW/kg (Calculated) : 52.4   Vital Signs: Temp: 98 F (36.7 C) (02/14 0802) Temp Source: Oral (02/14 0802) BP: 141/103 mmHg (02/14 0802) Pulse Rate: 66 (02/14 0802)  Labs:  Recent Labs  08/10/15 2052 08/11/15 0521 08/11/15 2203 08/12/15 0800  08/12/15 1656 08/12/15 2244 08/13/15 1029  HGB  --  9.0*  --  9.2*  DUPL SEE [id number]  --   --   --  9.3*  HCT  --  29.4*  --  27.9*  DUPL SEE [id number]  --   --   --  28.1*  PLT  --  154  --  157  DUPL SEE [id number]  --   --   --  143*  LABPROT  --  22.1*  --  31.1*  --   --   --  22.7*  INR  --  1.95*  --  3.06*  --   --   --  2.02*  HEPARINUNFRC 1.86* 0.31  --   --   --   --   --   --   CREATININE  --  10.17* 11.72* 12.06*  --   --   --  8.67*  TROPONINI  --   --   --   --   < > <0.03 <0.03 <0.03  < > = values in this interval not displayed.  Estimated Creatinine Clearance: 7.8 mL/min (by C-G formula based on Cr of 8.67).   Medical History: Past Medical History  Diagnosis Date  . Anemia   . ESRD (end stage renal disease) ([place]) Feb. 12, 2010    First date of Dialysis   . Secondary hyperparathyroidism ([place])   . Pulmonary emboli ([place])   . Hypertension   . GERD (gastroesophageal reflux disease)   . Hyperlipidemia   . 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 She was admitted on 2/10 after anaphylactic reaction to IV iron requiring intubation. Angioedema resolved.  On warfarin 7 mg daily at home. Unsure of compliance with low INR.  Admit INR was 1.28.  INR = 2.02 today,  therapeutic INR.  INR trend  1.28>1.58>1.95>3.06>2.02  after warfarin 7.5mg , 10mg , 10mg  then 2.5mg  since 08/09/15.  Hgb 12.2>10.8>9.0>9.2>9.3 today, pltc [id number]. Anemia of CKD.  No bleeding noted. Neprhology is considering Aranesp with next HD.  Noted that patient c/o some tightness in chest and throat discomfort on 2/12. Patient reportedly has been experiencing chest pain chronically, on and off for years. PCCM doubts recurrent PE despite sub-therapeutic INR on admission. Troponins trended 2/13-2/14 AM are wnl. CXR negative. PCCM notes patient had No issues overnight and is comfortable. PCCM has signed off.   Goal of Therapy:  INR 2-3 Monitor platelets by anticoagulation protocol: Yes   Plan:  Warfarin dose 7.5mg  tonight Daily INR, CBC Monitor for s/sx of bleeding  Nicole Cella, RPh Clinical Pharmacist Pager: [phone] 08/13/2015 1:18 PM

## 2015-08-13 NOTE — Progress Notes (Signed)
Name: Crystal Dunlap MRN: [id number] DOB: 1991-03-20    ADMISSION DATE:  08/09/2015  REFERRING MD :  Algis Liming  CHIEF COMPLAINT:  SOB/angioedema  BRIEF PATIENT DESCRIPTION:  89F, admitted for anaphylactic shock/severe angioedema related to IV iron. She was intubated for resp failure. Extubated on 2/12. Had cp and throat tightness on 2/13 -- appeared comfortable.  She continues to improve. No issues. Denies throat tightness. Good ae. Able to tolerate food. Has chronic cp.     PAST MEDICAL HISTORY :   has a past medical history of Anemia; ESRD (end stage renal disease) ([place]) (Feb. 12, 2010); Secondary hyperparathyroidism ([place]); Pulmonary emboli ([place]); Hypertension; GERD (gastroesophageal reflux disease); Hyperlipidemia; and Focal segmental glomerulosclerosis.  has past surgical history that includes Kidney transplant. Prior to Admission medications   Medication Sig Start Date End Date Taking? Authorizing Provider  calcium acetate (PHOSLO) 667 MG capsule Take 1,334 mg by mouth 3 (three) times daily with meals.    Yes Historical Provider, MD  carvedilol (COREG) 25 MG tablet Take 25 mg by mouth 2 (two) times daily with a meal.   Yes Historical Provider, MD  cloNIDine HCl (KAPVAY) 0.1 MG TB12 ER tablet Take 0.1 mg by mouth 2 (two) times daily.    Yes Historical Provider, MD  folic acid (FOLVITE) 1 MG tablet Take 1 mg by mouth daily.   Yes Historical Provider, MD  multivitamin (RENA-VIT) TABS tablet Take 1 tablet by mouth daily. 02/12/15  Yes Historical Provider, MD  ranitidine (ZANTAC) 300 MG capsule Take 300 mg by mouth every evening.   Yes Historical Provider, MD  rosuvastatin (CRESTOR) 10 MG tablet Take 10 mg by mouth daily.   Yes Historical Provider, MD  warfarin (COUMADIN) 1 MG tablet Take 2 mg by mouth daily after supper.    Yes Historical Provider, MD  warfarin (COUMADIN) 5 MG tablet Take 5 mg by mouth daily after supper.    Yes Historical Provider, MD   Allergies  Allergen  Reactions  . Ferrlecit [Na Ferric Gluc Cplx In Sucrose] Anaphylaxis  . Cefazolin   . Cyclosporine     Loss of motor skills  . Lactose Intolerance (Gi)   . Latex   . Vancomycin Rash    FAMILY HISTORY:  family history is not on file. SOCIAL HISTORY:  reports that she has never smoked. She has never used smokeless tobacco. She reports that she does not drink alcohol or use illicit drugs.  REVIEW OF SYSTEMS:   Constitutional: Negative for fever, chills, weight loss, malaise/fatigue and diaphoresis.  HENT: Negative for hearing loss, ear pain, nosebleeds, congestion, sore throat, neck pain, tinnitus and ear discharge.   Eyes: Negative for blurred vision, double vision, photophobia, pain, discharge and redness.  Respiratory: Negative for cough, hemoptysis, sputum production, shortness of breath, wheezing and stridor.   Cardiovascular: (+) chroni chest pain, palpitations, orthopnea, claudication, leg swelling and PND.  Gastrointestinal: Negative for heartburn, nausea, vomiting, abdominal pain, diarrhea, constipation, blood in stool and melena.  Genitourinary: Negative for dysuria, urgency, frequency, hematuria and flank pain.  Musculoskeletal: Negative for myalgias, back pain, joint pain and falls.  Skin: Negative for itching and rash.  Neurological: Negative for dizziness, tingling, tremors, sensory change, speech change, focal weakness, seizures, loss of consciousness, weakness and headaches.  Endo/Heme/Allergies: Negative for environmental allergies and polydipsia. Does not bruise/bleed easily.  SUBJECTIVE:   VITAL SIGNS: Temp:  [98 F (36.7 C)-98.5 F (36.9 C)] 98 F (36.7 C) (02/14 0802) Pulse Rate:  [66-98] 66 (02/14 0802)  Resp:  [19-20] 19 (02/14 0802) BP: (107-141)/(73-103) 141/103 mmHg (02/14 0802) SpO2:  [91 %-98 %] 96 % (02/14 0802) Weight:  [110 lb 7.2 oz (50.1 kg)] 110 lb 7.2 oz (50.1 kg) (02/13 2222)  PHYSICAL EXAMINATION: Vitals:  Filed Vitals:   08/12/15 1113  08/12/15 2222 08/13/15 0410 08/13/15 0802  BP: 114/80 107/73 131/93 141/103  Pulse: 84 98 80 66  Temp: 98.7 F (37.1 C) 98.3 F (36.8 C) 98.5 F (36.9 C) 98 F (36.7 C)  TempSrc: Tympanic Oral Oral Oral  Resp: 20 19 20 19   Height:      Weight:  110 lb 7.2 oz (50.1 kg)    SpO2: 97% 98% 91% 96%    Constitutional/General:  Pleasant, well-nourished, well-developed, not in any distress,  Comfortably seating.  Well kempt O2 sats 100% on RA.  Body mass index is 19.57 kg/(m^2). Wt Readings from Last 3 Encounters:  08/12/15 110 lb 7.2 oz (50.1 kg)  03/21/15 107 lb (48.535 kg)    HEENT: Pupils equal and reactive to light and accommodation. Anicteric sclerae. Normal nasal mucosa.   No oral  lesions,  mouth clear,  oropharynx clear, no postnasal drip. (-) Oral thrush. No dental caries.  Airway - Mallampati class III Difficult airway.   Neck: No masses. Midline trachea. No JVD, (-) LAD. (-) bruits appreciated.  Respiratory/Chest: Grossly normal chest. (-) deformity. (-) Accessory muscle use.  Symmetric expansion. (-) Tenderness on palpation.  Resonant on percussion.  Diminished BS on both lower lung zones. (-) wheezing, rhonchi (-) egophony Bibasilar crackles.   Cardiovascular: Regular rate and  rhythm, heart sounds normal, no murmur or gallops, no peripheral edema  Gastrointestinal:  Normal bowel sounds. Soft, non-tender. No hepatosplenomegaly.  (-) masses.   Musculoskeletal:  Normal muscle tone. Normal gait.   Extremities: Grossly normal. (-) clubbing, cyanosis.  (-) edema  Skin: (-) rash,lesions seen.   Neurological/Psychiatric : alert, oriented to time, place, person. Normal mood and affect        Recent Labs Lab 08/11/15 2203 08/12/15 0800 08/13/15 1029  NA 140 140 139  K 5.5* 4.1 3.1*  CL 102 100* 98*  CO2 17* 18* 23  BUN 81* 84* 46*  CREATININE 11.72* 12.06* 8.67*  GLUCOSE 143* 101* 94    Recent Labs Lab 08/11/15 0521 08/12/15 0800  08/13/15 1029  HGB 9.0* 9.2*  DUPL SEE M43220 9.3*  HCT 29.4* 27.9*  DUPL SEE M43220 28.1*  WBC 6.4 4.8  DUPL SEE M43220 3.5*  PLT 154 157  DUPL SEE M43220 143*   Dg Chest Port 1 View  08/12/2015  CLINICAL DATA:  Dyspnea, post dialysis. EXAM: PORTABLE CHEST 1 VIEW COMPARISON:  08/10/2015 chest radiograph. FINDINGS: Left internal jugular central venous catheter terminates near the cavoatrial junction. Vascular stent graft overlies the right mediastinum. Surgical clips are seen in the medial left upper quadrant of the abdomen. Stable cardiomediastinal silhouette with top-normal heart size. No pneumothorax. Stable minimal blunting of both costophrenic angles, suggesting trace bilateral pleural effusions. Lungs appear clear, with no acute consolidative airspace disease and no pulmonary edema. IMPRESSION: 1. Stable minimal blunting of both costophrenic angles, suggesting stable trace bilateral pleural effusions. 2. Otherwise no active cardiopulmonary disease.  No pulmonary edema. Electronically Signed   By: Ilona Sorrel M.D.   On: 08/12/2015 12:08    ASSESSMENT / PLAN: 5F, admitted for anaphylactic shock/severe angioedema related to IV iron. She was intubated on admission and extubated on 212. Transferred to the floors. Had some "throat  tightness" and "cp" on 2/12. She was comfortable when seen. It appears to throat tightness is better. Might be related to intubation. She's had chest pain chronically.  No issues overnight. Comfortable. Tolerating diet. Plan: 1. Wean off prednisone per primary. Anticipate she'll need 3-5 days of steroids 2. Continue Pepcid twice a day. Wean off accordingly. 3. Benadryl when necessary. 4. We'll sign off for now. Call us if there is any issues.    Elsie Saas A. Corrie Dandy, MD Pulmonary and [place] Pager : [phone] After 3 pm or if no response, call [phone]  08/13/2015, 12:26 PM

## 2015-08-13 NOTE — Care Management Important Message (Signed)
Important Message  Patient Details  Name: Crystal Dunlap MRN: [id number] Date of Birth: 26-Jan-1991   Medicare Important Message Given:  Yes    [name] 08/13/2015, 4:01 PM

## 2015-08-14 DIAGNOSIS — D638 Anemia in other chronic diseases classified elsewhere: Secondary | ICD-10-CM

## 2015-08-14 LAB — RENAL FUNCTION PANEL
ALBUMIN: 4 g/dL (ref 3.5–5.0)
ANION GAP: 20 — AB (ref 5–15)
BUN: 71 mg/dL — AB (ref 6–20)
CALCIUM: 7.1 mg/dL — AB (ref 8.9–10.3)
CO2: 22 mmol/L (ref 22–32)
Chloride: 100 mmol/L — ABNORMAL LOW (ref 101–111)
Creatinine, Ser: 11.11 mg/dL — ABNORMAL HIGH (ref 0.44–1.00)
GFR calc Af Amer: 5 mL/min — ABNORMAL LOW (ref >60)
GFR, EST NON AFRICAN AMERICAN: 4 mL/min — AB (ref >60)
GLUCOSE: 96 mg/dL (ref 65–99)
PHOSPHORUS: 3.9 mg/dL (ref 2.5–4.6)
POTASSIUM: 3.3 mmol/L — AB (ref 3.5–5.1)
SODIUM: 142 mmol/L (ref 135–145)

## 2015-08-14 LAB — CBC
HEMATOCRIT: 28.4 % — AB (ref 36.0–46.0)
Hemoglobin: 8.9 g/dL — ABNORMAL LOW (ref 12.0–15.0)
MCH: 32 pg (ref 26.0–34.0)
MCHC: 31.3 g/dL (ref 30.0–36.0)
MCV: 102.2 fL — ABNORMAL HIGH (ref 78.0–100.0)
PLATELETS: 143 10*3/uL — AB (ref 150–400)
RBC: 2.78 MIL/uL — ABNORMAL LOW (ref 3.87–5.11)
RDW: 16.8 % — AB (ref 11.5–15.5)
WBC: 3.4 10*3/uL — AB (ref 4.0–10.5)

## 2015-08-14 LAB — GLUCOSE, CAPILLARY
GLUCOSE-CAPILLARY: 85 mg/dL (ref 65–99)
GLUCOSE-CAPILLARY: 79 mg/dL (ref 65–99)
Glucose-Capillary: 146 mg/dL — ABNORMAL HIGH (ref 65–99)

## 2015-08-14 LAB — PROTIME-INR
INR: 2 — AB (ref 0.00–1.49)
PROTHROMBIN TIME: 86 s — AB (ref 11.6–15.2)
PROTHROMBIN TIME: 22.5 s — AB (ref 11.6–15.2)

## 2015-08-14 LAB — PARATHYROID HORMONE, INTACT (NO CA): PTH: 52 pg/mL (ref 15–65)

## 2015-08-14 LAB — VITAMIN D 25 HYDROXY (VIT D DEFICIENCY, FRACTURES): Vit D, 25-Hydroxy: 8.6 ng/mL — ABNORMAL LOW (ref 30.0–100.0)

## 2015-08-14 MED ORDER — LIDOCAINE VISCOUS 2 % MT SOLN
15.0000 mL | Freq: Once | OROMUCOSAL | Status: DC
  Filled 2015-08-14: qty 15

## 2015-08-14 MED ORDER — DIPHENHYDRAMINE HCL 25 MG PO CAPS: 25.0000 mg | ORAL_CAPSULE | Freq: Four times a day (QID) | ORAL | Status: DC | PRN

## 2015-08-14 MED ORDER — PREDNISONE 10 MG PO TABS: 10.0000 mg | ORAL_TABLET | Freq: Every day | ORAL | Status: DC

## 2015-08-14 MED ORDER — PENTAFLUOROPROP-TETRAFLUOROETH EX AERO: 1.0000 "application " | INHALATION_SPRAY | CUTANEOUS | Status: DC | PRN

## 2015-08-14 MED ORDER — ALTEPLASE 2 MG IJ SOLR
2.0000 mg | Freq: Once | INTRAMUSCULAR | Status: DC | PRN
  Filled 2015-08-14: qty 2

## 2015-08-14 MED ORDER — LIDOCAINE-PRILOCAINE 2.5-2.5 % EX CREA
1.0000 "application " | TOPICAL_CREAM | CUTANEOUS | Status: DC | PRN
  Filled 2015-08-14: qty 5

## 2015-08-14 MED ORDER — WARFARIN SODIUM 7.5 MG PO TABS
7.5000 mg | ORAL_TABLET | Freq: Once | ORAL | Status: AC
  Administered 2015-08-14: 7.5 mg via ORAL
  Filled 2015-08-14: qty 1

## 2015-08-14 MED ORDER — SODIUM CHLORIDE 0.9 % IV SOLN: 100.0000 mL | INTRAVENOUS | Status: DC | PRN

## 2015-08-14 MED ORDER — HEPARIN SODIUM (PORCINE) 1000 UNIT/ML DIALYSIS: 1000.0000 [IU] | INTRAMUSCULAR | Status: DC | PRN

## 2015-08-14 MED ORDER — FENTANYL CITRATE (PF) 100 MCG/2ML IJ SOLN
INTRAMUSCULAR | Status: AC
  Filled 2015-08-14: qty 2

## 2015-08-14 MED ORDER — HEPARIN SODIUM (PORCINE) 1000 UNIT/ML DIALYSIS: 20.0000 [IU]/kg | INTRAMUSCULAR | Status: DC | PRN

## 2015-08-14 MED ORDER — DIPHENHYDRAMINE HCL 50 MG/ML IJ SOLN
12.5000 mg | Freq: Once | INTRAMUSCULAR | Status: AC
Start: 2015-08-14 — End: 2015-08-14
  Administered 2015-08-14: 12.5 mg via INTRAVENOUS

## 2015-08-14 MED ORDER — DIPHENHYDRAMINE HCL 50 MG/ML IJ SOLN
INTRAMUSCULAR | Status: AC
  Administered 2015-08-14: 12.5 mg via INTRAVENOUS
  Filled 2015-08-14: qty 1

## 2015-08-14 MED ORDER — PANTOPRAZOLE SODIUM 40 MG PO TBEC: 40.0000 mg | DELAYED_RELEASE_TABLET | Freq: Every day | ORAL | Status: DC

## 2015-08-14 MED ORDER — LIDOCAINE HCL (PF) 1 % IJ SOLN: 5.0000 mL | INTRAMUSCULAR | Status: DC | PRN

## 2015-08-14 MED ORDER — COUMADIN BOOK
Freq: Once | Status: AC
  Administered 2015-08-14: 18:00:00
  Filled 2015-08-14: qty 1

## 2015-08-14 MED ORDER — PREDNISONE 10 MG PO TABS
10.0000 mg | ORAL_TABLET | Freq: Every day | ORAL | Status: DC
Start: 2015-08-16 — End: 2015-08-14

## 2015-08-14 NOTE — Progress Notes (Signed)
Patient claims that her throat is irritiating her-and feels "that it may close again". Claims that her voice quality is now softer that prior. No stridor on exam-lying comfortably in bed. Good air entry on exam. I think she has some anxiety-but given recent intubation/extubation-it may be reasonable to consult ENT for a direct laryngoscopic exam. I have placed a call to Dr Erik Obey. I will hold discharge for how.

## 2015-08-14 NOTE — Progress Notes (Signed)
ANTICOAGULATION CONSULT NOTE - follow up   Pharmacy Consult for warfarin Indication: h/o  DVT/ PE  Allergies  Allergen Reactions  . Ferrlecit [Na Ferric Gluc Cplx In Sucrose] Anaphylaxis    Anaphylactic shock/severe angioedema on 08/09/15 related to IV iron Ferrlecit IV)  required intubation for respiratory failure.  . Cefazolin   . Cyclosporine     Loss of motor skills  . Lactose Intolerance (Gi)   . Latex   . Vancomycin Rash    Patient Measurements: Height: 5\' 3"  (160 cm) Weight: 108 lb 0.4 oz (49 kg) (Bedscale) IBW/kg (Calculated) : 52.4   Vital Signs: Temp: 98.6 F (37 C) (02/15 1529) Temp Source: Oral (02/15 1529) BP: 137/91 mmHg (02/15 1529) Pulse Rate: 88 (02/15 1529)  Labs:  Recent Labs  08/12/15 0800  08/12/15 1656 08/12/15 2244 08/13/15 1029 08/14/15 1118 08/14/15 1119 08/14/15 1230  HGB 9.2*  DUPL SEE [id number]  --   --   --  9.3*  --  8.9*  --   HCT 27.9*  DUPL SEE [id number]  --   --   --  28.1*  --  28.4*  --   PLT 157  DUPL SEE [id number]  --   --   --  143*  --  143*  --   LABPROT 31.1*  --   --   --  22.7* 86.0*  --  22.5*  INR 3.06*  --   --   --  2.02* >10.00*  --  2.00*  CREATININE 12.06*  --   --   --  8.67* 11.11*  --   --   TROPONINI  --   < > <0.03 <0.03 <0.03  --   --   --   < > = values in this interval not displayed.  Estimated Creatinine Clearance: 6 mL/min (by C-G formula based on Cr of 11.11).   Medical History: Past Medical History  Diagnosis Date  . Anemia   . ESRD (end stage renal disease) ([place]) Feb. 12, 2010    First date of Dialysis   . Secondary hyperparathyroidism ([place])   . Pulmonary emboli ([place])   . Hypertension   . GERD (gastroesophageal reflux disease)   . Hyperlipidemia   . 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 She was admitted on 2/10 after anaphylactic reaction to IV iron requiring intubation. Angioedema resolved.  On warfarin 7 mg daily at home.  Unsure of compliance with low INR.  Admit INR was 1.28.  INR = 2.00 today, therapeutic INR.  INR trend  1.28>1.58>1.95>3.06>2.02  after warfarin 7.5mg , 10mg , 10mg  then 2.5mg  since 08/09/15.  Hgb 12.2>10.8>9.0>9.2>9.3 today, pltc [phone]. Anemia of CKD.  No bleeding noted. Neprhology is considering Aranesp with next HD.  Noted that patient c/o some tightness in chest and throat discomfort on 2/12. Patient reportedly has been experiencing chest pain chronically, on and off for years. PCCM doubts recurrent PE despite sub-therapeutic INR on admission. Troponins trended 2/13-2/14 AM are wnl. CXR negative. PCCM notes patient had No issues overnight and is comfortable. PCCM has signed off.   Goal of Therapy:  INR 2-3 Monitor platelets by anticoagulation protocol: Yes   Plan:  Warfarin dose 7.5mg  x1 again  tonight Daily INR, CBC Monitor for s/sx of bleeding  Mikeyla Music C. Lennox Grumbles, PharmD Pharmacy Resident  Pager: [phone] 08/14/2015 6:39 PM

## 2015-08-14 NOTE — Discharge Summary (Addendum)
PATIENT DETAILS Name: Crystal Dunlap Age: 25 y.o. Sex: female Date of Birth: 1990/11/08 MRN: [id number]. Admitting Physician: Brand Males, MD PCP:No primary care provider on file.  Admit Date: 08/09/2015 Discharge date: 08/15/2015  Recommendations for Outpatient Follow-up:  1. NO IRON-HAD ANAPHYLAXIS 2. Please repeat CBC/BMET at next visit 3. Please continue to follow INR-on coumadin  PRIMARY DISCHARGE DIAGNOSIS:  Principal Problem:   Anaphylaxis Active Problems:   Allergic angioedema   ESRD on dialysis ([place])   Acute respiratory failure with hypoxia (H[facility])   Respiratory failure ([facility])      PAST MEDICAL HISTORY: Past Medical History  Diagnosis Date  . Anemia   . ESRD (end stage renal disease) ([place]) Feb. 12, 2010    First date of Dialysis   . Secondary hyperparathyroidism ([place])   . Pulmonary emboli ([place])   . Hypertension   . GERD (gastroesophageal reflux disease)   . Hyperlipidemia   . Focal segmental glomerulosclerosis     DISCHARGE MEDICATIONS: Current Discharge Medication List    START taking these medications   Details  diphenhydrAMINE (BENADRYL) 25 mg capsule Take 1 capsule (25 mg total) by mouth every 6 (six) hours as needed for allergies. Qty: 15 capsule, Refills: 0    pantoprazole (PROTONIX) 40 MG tablet Take 1 tablet (40 mg total) by mouth 2 (two) times daily before a meal. Qty: 60 tablet, Refills: 0    predniSONE (DELTASONE) 10 MG tablet Take 1 tablet (10 mg total) by mouth daily with breakfast. For 2 days and then stop Qty: 2 tablet, Refills: 0      CONTINUE these medications which have NOT CHANGED   Details  calcium acetate (PHOSLO) 667 MG capsule Take 1,334 mg by mouth 3 (three) times daily with meals.     carvedilol (COREG) 25 MG tablet Take 25 mg by mouth 2 (two) times daily with a meal.    cloNIDine HCl (KAPVAY) 0.1 MG TB12 ER tablet Take 0.1 mg by mouth 2 (two) times daily.     folic acid (FOLVITE) 1 MG tablet Take 1 mg by mouth daily.      multivitamin (RENA-VIT) TABS tablet Take 1 tablet by mouth daily. Refills: 11    ranitidine (ZANTAC) 300 MG capsule Take 300 mg by mouth every evening.    rosuvastatin (CRESTOR) 10 MG tablet Take 10 mg by mouth daily.    !! warfarin (COUMADIN) 1 MG tablet Take 2 mg by mouth daily after supper.     !! warfarin (COUMADIN) 5 MG tablet Take 5 mg by mouth daily after supper.      !! - Potential duplicate medications found. Please discuss with provider.      ALLERGIES:   Allergies  Allergen Reactions  . Ferrlecit [Na Ferric Gluc Cplx In Sucrose] Anaphylaxis    Anaphylactic shock/severe angioedema on 08/09/15 related to IV iron Ferrlecit IV)  required intubation for respiratory failure.  . Cefazolin   . Cyclosporine     Loss of motor skills  . Lactose Intolerance (Gi)   . Latex   . Vancomycin Rash    BRIEF HPI:  See H&P, Labs, Consult and Test reports for all details in brief, patient 25 year old female with history of ESRD secondary to focal segmental glomerulosclerosis, on HD MWF, failed renal transplant, followed at [facility] outpatient dialysis, iron deficiency anemia, developed angioedema after a second dose of Ferrlecit on 2/10, transferred to med [facility], intubated and started on epinephrine drip and transferred to [facility] ICU.  CONSULTATIONS:  pulmonary/intensive care and nephrology  PERTINENT RADIOLOGIC STUDIES: Dg Chest Port 1 View  08/12/2015  CLINICAL DATA:  Dyspnea, post dialysis. EXAM: PORTABLE CHEST 1 VIEW COMPARISON:  08/10/2015 chest radiograph. FINDINGS: Left internal jugular central venous catheter terminates near the cavoatrial junction. Vascular stent graft overlies the right mediastinum. Surgical clips are seen in the medial left upper quadrant of the abdomen. Stable cardiomediastinal silhouette with top-normal heart size. No pneumothorax. Stable minimal blunting of both costophrenic angles, suggesting trace bilateral pleural effusions. Lungs appear  clear, with no acute consolidative airspace disease and no pulmonary edema. IMPRESSION: 1. Stable minimal blunting of both costophrenic angles, suggesting stable trace bilateral pleural effusions. 2. Otherwise no active cardiopulmonary disease.  No pulmonary edema. Electronically Signed   By: Ilona Sorrel M.D.   On: 08/12/2015 12:08   Dg Chest Port 1 View  08/10/2015  CLINICAL DATA:  Respiratory failure and shortness of breath EXAM: PORTABLE CHEST 1 VIEW COMPARISON:  Yesterday FINDINGS: Endotracheal tube tip between the clavicular heads and carina. The orogastric tube reaches the stomach at least. Dialysis catheter from the left tip at the upper right atrium. Small pleural effusions. There is no edema, consolidation, or pneumothorax. Normal heart size. IMPRESSION: 1. Stable positioning of tubes and dialysis catheter. 2. Small pleural effusions. Electronically Signed   By: Monte Fantasia M.D.   On: 08/10/2015 07:35   Dg Chest Portable 1 View  08/09/2015  CLINICAL DATA:  Nonresponsive Chronic kidney failure. EXAM: PORTABLE CHEST 1 VIEW COMPARISON:  None. FINDINGS: Nasogastric tube with the tip projecting over the body of the stomach. Dual-lumen central venous catheter with the tip projecting over the cavoatrial junction. Endotracheal tube with the tip 2.9 cm above the carina. There is no focal parenchymal opacity. There is no pleural effusion or pneumothorax. The heart and mediastinal contours are unremarkable. The osseous structures are unremarkable. IMPRESSION: Nasogastric tube with the tip projecting over the body of the stomach. Dual-lumen central venous catheter with the tip projecting over the cavoatrial junction. Endotracheal tube with the tip 2.9 cm above the carina. Electronically Signed   By: Kathreen Devoid   On: 08/09/2015 12:40     PERTINENT LAB RESULTS: CBC:  Recent Labs  08/13/15 1029 08/14/15 1119  WBC 3.5* 3.4*  HGB 9.3* 8.9*  HCT 28.1* 28.4*  PLT 143* 143*   CMET CMP       Component Value Date/Time   NA 139 08/15/2015 1000   K 3.0* 08/15/2015 1000   CL 100* 08/15/2015 1000   CO2 23 08/15/2015 1000   GLUCOSE 113* 08/15/2015 1000   BUN 41* 08/15/2015 1000   CREATININE 7.84* 08/15/2015 1000   CALCIUM 7.4* 08/15/2015 1000   PROT 8.3* 08/09/2015 1532   ALBUMIN 3.9 08/15/2015 1000   AST 30 08/09/2015 1532   ALT 23 08/09/2015 1532   ALKPHOS 127* 08/09/2015 1532   BILITOT 0.5 08/09/2015 1532   GFRNONAA 6* 08/15/2015 1000   GFRAA 7* 08/15/2015 1000    GFR Estimated Creatinine Clearance: 8.8 mL/min (by C-G formula based on Cr of 7.84). No results for input(s): LIPASE, AMYLASE in the last 72 hours.  Recent Labs  08/12/15 1656 08/12/15 2244 08/13/15 1029  TROPONINI <0.03 <0.03 <0.03   Invalid input(s): POCBNP No results for input(s): DDIMER in the last 72 hours. No results for input(s): HGBA1C in the last 72 hours. No results for input(s): CHOL, HDL, LDLCALC, TRIG, CHOLHDL, LDLDIRECT in the last 72 hours. No results for input(s): TSH, T4TOTAL, T3FREE, THYROIDAB in  the last 72 hours.  Invalid input(s): FREET3 No results for input(s): VITAMINB12, FOLATE, FERRITIN, TIBC, IRON, RETICCTPCT in the last 72 hours. Coags:  Recent Labs  08/14/15 1118 08/14/15 1230  INR >10.00* 2.00*   Microbiology: Recent Results (from the past 240 hour(s))  MRSA PCR Screening     Status: None   Collection Time: 08/09/15  1:44 PM  Result Value Ref Range Status   MRSA by PCR NEGATIVE NEGATIVE Final    Comment:        The GeneXpert MRSA Assay (FDA approved for NASAL specimens only), is one component of a comprehensive MRSA colonization surveillance program. It is not intended to diagnose MRSA infection nor to guide or monitor treatment for MRSA infections.      BRIEF HOSPITAL COURSE:  Acute hypoxic respiratory failure secondary to anaphylaxis with angioedema: Acute respiratory failure resolved. Extubated 2/12. Doing well on room air.  Anaphylaxis with  angioedema secondary to IV Ferrlecit : Patient was treated initially with IV epinephrine drip, Solu-Medrol and Benadryl. Angioedema resolved. She complained of some mild throat discomfort on 2/13-she was evaluated by PCCM. Suspicion was for throat discomfort postintubation. Seen by ENT, underwent direct laryngoscopy on 2/15 which showed supraglottic laryngeal edema most prominent in the post cricoid region. Recommendations are to continue with PPI twice a day. Currently doing well without any signs of delayed anaphylaxis. Will continue tapering prednisone, as needed Benadryl on discharge.   History of chronic chest pain: Per patient, she has a long-standing history of intermittent chest pain for years. EKG/troponins negative, on Coumadin with therapeutic INR. Doubt any workup is required.   History of pulmonary embolism: INR therapeutic-continue Coumadin.Continue to follow INR levels in the outpatient setting  History of end-stage renal disease: Nephrology following, continue HD- Monday Wednesday Friday. History of FSGS with failed transplant 2.  Hypertension: Resume Coreg and Clonidine on discharge.  Dyslipidemia: Resume Crestor.  Acute encephalopathy: Secondary to anaphylaxis, resolved  Anemia: Secondary to see daily,Aranesp prerenal. Unable to use iron given anaphylaxis.  Hypokalemia: Resolved.  Hypocalcemia/hyperphosphatemia: Per nephrology.  TODAY-DAY OF DISCHARGE:  Subjective:   Crystal Dunlap today has no headache,no chest abdominal pain,no new weakness tingling or numbness, feels much better wants to go home today.  Objective:   Blood pressure 110/75, pulse 88, temperature 97.3 F (36.3 C), temperature source Oral, resp. rate 15, height 5\' 3"  (1.6 m), weight 50.7 kg (111 lb 12.4 oz), SpO2 100 %.  Intake/Output Summary (Last 24 hours) at 08/15/15 1104 Last data filed at 08/15/15 0600  Gross per 24 hour  Intake      0 ml  Output   1011 ml  Net  -1011 ml   Filed Weights    08/14/15 1430 08/14/15 1956 08/15/15 0950  Weight: 49 kg (108 lb 0.4 oz) 50.7 kg (111 lb 12.4 oz) 50.7 kg (111 lb 12.4 oz)    Exam Awake Alert, Oriented *3, No new F.N deficits, Normal affect [place].AT,PERRAL Supple Neck,No JVD, No cervical lymphadenopathy appriciated.  Symmetrical Chest wall movement, Good air movement bilaterally, CTAB RRR,No Gallops,Rubs or new Murmurs, No Parasternal Heave +ve B.Sounds, Abd Soft, Non tender, No organomegaly appriciated, No rebound -guarding or rigidity. No Cyanosis, Clubbing or edema, No new Rash or bruise  DISCHARGE CONDITION: Stable  DISPOSITION: Home  DISCHARGE INSTRUCTIONS:    Activity:  As tolerated   YOU HAD A ANAPHYLACTIC REACTION OF IRON-PLEASE LET ALL YOUR HEALTH CARE PROVIDERS KNOW THAT YOU ARE NOT SUPPOSED TO TAKE IRON   Get Medicines reviewed  and adjusted: Please take all your medications with you for your next visit with your Primary MD  Please request your Primary MD to go over all hospital tests and procedure/radiological results at the follow up, please ask your Primary MD to get all Hospital records sent to his/her office.  If you experience worsening of your admission symptoms, develop shortness of breath, life threatening emergency, suicidal or homicidal thoughts you must seek medical attention immediately by calling 911 or calling your MD immediately  if symptoms less severe.  You must read complete instructions/literature along with all the possible adverse reactions/side effects for all the Medicines you take and that have been prescribed to you. Take any new Medicines after you have completely understood and accpet all the possible adverse reactions/side effects.   Do not drive when taking Pain medications.   Do not take more than prescribed Pain, Sleep and Anxiety Medications  Special Instructions: If you have smoked or chewed Tobacco  in the last 2 yrs please stop smoking, stop any regular Alcohol  and or any  Recreational drug use.  Wear Seat belts while driving.  Please note  You were cared for by a hospitalist during your hospital stay. Once you are discharged, your primary care physician will handle any further medical issues. Please note that NO REFILLS for any discharge medications will be authorized once you are discharged, as it is imperative that you return to your primary care physician (or establish a relationship with a primary care physician if you do not have one) for your aftercare needs so that they can reassess your need for medications and monitor your lab values.   Diet recommendation: Heart Healthy diet/Renal Diet  Discharge Instructions    Call MD for:  difficulty breathing, headache or visual disturbances    Complete by:  As directed      Call MD for:  hives    Complete by:  As directed      Call MD for:  persistant dizziness or light-headedness    Complete by:  As directed      Call MD for:  persistant nausea and vomiting    Complete by:  As directed      Diet - low sodium heart healthy    Complete by:  As directed      Increase activity slowly    Complete by:  As directed            Follow-up Information    Schedule an appointment as soon as possible for a visit in 1 week to follow up.   Contact information:   Primary MD      Please follow up.   Contact information:   Hemodialysis Clinic: at your usual schedule      Total Time spent on discharge equals 45 minutes.  SignedOren Binet 08/15/2015 11:04 AM

## 2015-08-14 NOTE — Discharge Instructions (Signed)
YOU HAD A ANAPHYLACTIC REACTION OF IRON-PLEASE LET ALL YOUR HEALTH CARE PROVIDERS KNOW THAT YOU ARE NOT SUPPOSED TO TAKE IRON  Get Medicines reviewed and adjusted. Please take all your medications with you for your next visit with your Primary MD  Please request your Primary MD to go over all hospital tests and procedure/radiological results at the follow up, please ask your Primary MD to get all Hospital records sent to his/her office.  If you experience worsening of your admission symptoms, develop shortness of breath, life threatening emergency, suicidal or homicidal thoughts you must seek medical attention immediately by calling 911 or calling your MD immediately  if symptoms less severe.  You must read complete instructions/literature along with all the possible adverse reactions/side effects for all the Medicines you take and that have been prescribed to you. Take any new Medicines after you have completely understood and accpet all the possible adverse reactions/side effects.   Do not drive when taking Pain medications or sleeping medications (Benzodaizepines)  Do not take more than prescribed Pain, Sleep and Anxiety Medications  Special Instructions: If you have smoked or chewed Tobacco  in the last 2 yrs please stop smoking, stop any regular Alcohol  and or any Recreational drug use.  Wear Seat belts while driving.  Please note  You were cared for by a hospitalist during your hospital stay. Once you are discharged, your primary care physician will handle any further medical issues. Please note that NO REFILLS for any discharge medications will be authorized once you are discharged, as it is imperative that you return to your primary care physician (or establish a relationship with a primary care physician if you do not have one) for your aftercare needs so that they can reassess your need for medications and monitor your lab values.   Information on my medicine - Coumadin    (Warfarin)  This medication education was reviewed with me or my healthcare representative (pharmacist) as part of my discharge preparation.    Why was Coumadin prescribed for you?  (This Patient was taking coumadin prior to this admission):  Coumadin was prescribed for you because you have a blood clot or a medical condition that can cause an increased risk of forming blood clots. Blood clots can cause serious health problems by blocking the flow of blood to the heart, lung, or brain. Coumadin can prevent harmful blood clots from forming. As a reminder your indication for Coumadin is:   Deep Vein Thrombosis Treatment History of blood clot in left arm. What test will check on my response to Coumadin? While on Coumadin (warfarin) you will need to have an INR test regularly to ensure that your dose is keeping you in the desired range. The INR (international normalized ratio) number is calculated from the result of the laboratory test called prothrombin time (PT).  If an INR APPOINTMENT HAS NOT ALREADY BEEN MADE FOR YOU please schedule an appointment to have this lab work done by your health care provider within 7 days. Your INR goal is usually a number between:  2 to 3 or your provider may give you a more narrow range like 2-2.5.  Ask your health care provider during an office visit what your goal INR is.  What  do you need to  know  About  COUMADIN? Take Coumadin (warfarin) exactly as prescribed by your healthcare provider about the same time each day.  DO NOT stop taking without talking to the doctor who prescribed the medication.  Stopping without other blood clot prevention medication to take the place of Coumadin may increase your risk of developing a new clot or stroke.  Get refills before you run out.  What do you do if you miss a dose? If you miss a dose, take it as soon as you remember on the same day then continue your regularly scheduled regimen the next day.  Do not take two doses of  Coumadin at the same time.  Important Safety Information A possible side effect of Coumadin (Warfarin) is an increased risk of bleeding. You should call your healthcare provider right away if you experience any of the following: ? Bleeding from an injury or your nose that does not stop. ? Unusual colored urine (red or dark brown) or unusual colored stools (red or black). ? Unusual bruising for unknown reasons. ? A serious fall or if you hit your head (even if there is no bleeding).  Some foods or medicines interact with Coumadin (warfarin) and might alter your response to warfarin. To help avoid this: ? Eat a balanced diet, maintaining a consistent amount of Vitamin K. ? Notify your provider about major diet changes you plan to make. ? Avoid alcohol or limit your intake to 1 drink for women and 2 drinks for men per day. (1 drink is 5 oz. wine, 12 oz. beer, or 1.5 oz. liquor.)  Make sure that ANY health care provider who prescribes medication for you knows that you are taking Coumadin (warfarin).  Also make sure the healthcare provider who is monitoring your Coumadin knows when you have started a new medication including herbals and non-prescription products.  Coumadin (Warfarin)  Major Drug Interactions  Increased Warfarin Effect Decreased Warfarin Effect  Alcohol (large quantities) Antibiotics (esp. Septra/Bactrim, Flagyl, Cipro) Amiodarone (Cordarone) Aspirin (ASA) Cimetidine (Tagamet) Megestrol (Megace) NSAIDs (ibuprofen, naproxen, etc.) Piroxicam (Feldene) Propafenone (Rythmol SR) Propranolol (Inderal) Isoniazid (INH) Posaconazole (Noxafil) Barbiturates (Phenobarbital) Carbamazepine (Tegretol) Chlordiazepoxide (Librium) Cholestyramine (Questran) Griseofulvin Oral Contraceptives Rifampin Sucralfate (Carafate) Vitamin K   Coumadin (Warfarin) Major Herbal Interactions  Increased Warfarin Effect Decreased Warfarin Effect  Garlic Ginseng Ginkgo biloba Coenzyme Q10 Green  tea St. Johns wort    Coumadin (Warfarin) FOOD Interactions  Eat a consistent number of servings per week of foods HIGH in Vitamin K (1 serving =  cup)  Collards (cooked, or boiled & drained) Kale (cooked, or boiled & drained) Mustard greens (cooked, or boiled & drained) Parsley *serving size only =  cup Spinach (cooked, or boiled & drained) Swiss chard (cooked, or boiled & drained) Turnip greens (cooked, or boiled & drained)  Eat a consistent number of servings per week of foods MEDIUM-HIGH in Vitamin K (1 serving = 1 cup)  Asparagus (cooked, or boiled & drained) Broccoli (cooked, boiled & drained, or raw & chopped) Brussel sprouts (cooked, or boiled & drained) *serving size only =  cup Lettuce, raw (green leaf, endive, romaine) Spinach, raw Turnip greens, raw & chopped   These websites have more information on Coumadin (warfarin):  FailFactory.se; VeganReport.com.au;

## 2015-08-14 NOTE — Progress Notes (Signed)
[facility] Progress Note    Assessment/ Plan:   ESRD: Patient typically followed at [facility] and receives HD MWF. Last HD 2/13 - HD again today  Anemia of CKD: Hgb 12.2> 10.8 > 9.0 - no further IV iron - given anaphylaxis - resume Aranesp with home HD  Hyperkalemia: Resolved K 3.4 on admission > 5.3 > 6.2 >5.5 > pending this AM  Hypocalcemia, hyperphosphatemia: Ca 7.9 > 7.5 > 6.4 > 6.2 > 7. Phos 6.6 - Continue home Phoslo and rena-vit - PTH pending  Subjective:   Reports she is feeling better, no SOB, is unsure when she is going home    Objective:   BP 106/63 mmHg  Pulse 96  Temp(Src) 98.4 F (36.9 C) (Oral)  Resp 20  Ht 5\' 3"  (1.6 m)  Wt 110 lb 14.3 oz (50.3 kg)  BMI 19.65 kg/m2  SpO2 96%  Intake/Output Summary (Last 24 hours) at 08/14/15 0816 Last data filed at 08/13/15 2159  Gross per 24 hour  Intake    600 ml  Output      0 ml  Net    600 ml   Weight change: -1 lb 12.2 oz (-0.8 kg)  Physical Exam: Gen: laying comfortably, NAD CVS: RRR, no m/r/g Resp: CTAB, no w/r/c. Normal WOB Abd: Soft, NTND Ext: No LE edema, tunneled catheter in place on L chest  Imaging: Dg Chest Port 1 View  08/12/2015  CLINICAL DATA:  Dyspnea, post dialysis. EXAM: PORTABLE CHEST 1 VIEW COMPARISON:  08/10/2015 chest radiograph. FINDINGS: Left internal jugular central venous catheter terminates near the cavoatrial junction. Vascular stent graft overlies the right mediastinum. Surgical clips are seen in the medial left upper quadrant of the abdomen. Stable cardiomediastinal silhouette with top-normal heart size. No pneumothorax. Stable minimal blunting of both costophrenic angles, suggesting trace bilateral pleural effusions. Lungs appear clear, with no acute consolidative airspace disease and no pulmonary edema. IMPRESSION: 1. Stable minimal blunting of both costophrenic angles, suggesting stable trace bilateral pleural effusions. 2. Otherwise no active cardiopulmonary disease.  No  pulmonary edema. Electronically Signed   By: Ilona Sorrel M.D.   On: 08/12/2015 12:08    Labs: BMET  Recent Labs Lab 08/09/15 1532 08/10/15 1401 08/11/15 0521 08/11/15 2203 08/12/15 0800 08/13/15 1029  NA 141 139 141 140 140 139  K 3.4* 5.3* 6.2* 5.5* 4.1 3.1*  CL 103 104 105 102 100* 98*  CO2 24 18* 18* 17* 18* 23  GLUCOSE 189* 114* 119* 143* 101* 94  BUN 21* 47* 65* 81* 84* 46*  CREATININE 6.07* 9.13* 10.17* 11.72* 12.06* 8.67*  CALCIUM 7.9* 7.5* 6.4* 6.2* 5.7* 7.0*  PHOS  --   --  6.6*  --  6.6* 4.6   CBC  Recent Labs Lab 08/09/15 1532 08/10/15 1401 08/11/15 0521 08/12/15 0800 08/13/15 1029  WBC 8.3 9.2 6.4 4.8  DUPL SEE [id number] 3.5*  NEUTROABS 7.1  --   --  3.8  --   HGB 12.2 10.8* 9.0* 9.2*  DUPL SEE [id number] 9.3*  HCT 39.2 33.8* 29.4* 27.9*  DUPL SEE [id number] 28.1*  MCV 103.4* 104.6* 103.5* 103.3*  DUPL SEE [id number] 101.8*  PLT 204 185 154 157  DUPL SEE [id number] 143*    Medications:    . antiseptic oral rinse  7 mL Mouth Rinse BID  . antiseptic oral rinse  7 mL Mouth Rinse 10 times per day  . calcium acetate  1,334 mg Oral TID WC  . carvedilol  6.25 mg  Oral BID WC  . famotidine (PEPCID) IV  20 mg Intravenous Q24H  . insulin aspart  0-9 Units Subcutaneous 6 times per day  . multivitamin  1 tablet Oral QHS  . pantoprazole  40 mg Oral Daily  . predniSONE  10 mg Oral BID WC   Followed by  . [START ON 08/16/2015] predniSONE  10 mg Oral Q breakfast  . rosuvastatin  10 mg Oral Daily  . Warfarin - Pharmacist Dosing Inpatient   Does not apply [id number]    Virginia Crews, MD, MPH PGY-2,  [facility] 08/14/2015 8:16 AM    Renal Attending; She is doing well and will have HD today before discharge.  No iron. She will sort out future iron issues with the staff at her HD unit. Zamoria Boss C

## 2015-08-14 NOTE — Consult Note (Signed)
Crystal Dunlap, Crystal Dunlap 25 y.o., female [id number]     Chief Complaint: throat tightness  HPI: 25 yo bf, ESRD.  Failed transplants x 2.  Hospitalized 5 days ago with anaphylactic reaction to IV Iron.  Intubated x 2 days, then extubated on 12 FEB.  Some residual sore throat since then, maybe slightly worse today.  Was to be discharged today, but held on account of throat symptoms.  Known reflux, on Zantac at home. No breathing difficulty.  Swallowing uncomfortable.  Voice less strong today.  PMH: Past Medical History  Diagnosis Date  . Anemia   . ESRD (end stage renal disease) ([place]) Feb. 12, 2010    First date of Dialysis   . Secondary hyperparathyroidism ([place])   . Pulmonary emboli ([place])   . Hypertension   . GERD (gastroesophageal reflux disease)   . Hyperlipidemia   . Focal segmental glomerulosclerosis     Surg Hx: Past Surgical History  Procedure Laterality Date  . Kidney transplant      FHx:  History reviewed. No pertinent family history. SocHx:  reports that she has never smoked. She has never used smokeless tobacco. She reports that she does not drink alcohol or use illicit drugs.  ALLERGIES:  Allergies  Allergen Reactions  . Ferrlecit [Na Ferric Gluc Cplx In Sucrose] Anaphylaxis    Anaphylactic shock/severe angioedema on 08/09/15 related to IV iron Ferrlecit IV)  required intubation for respiratory failure.  . Cefazolin   . Cyclosporine     Loss of motor skills  . Lactose Intolerance (Gi)   . Latex   . Vancomycin Rash    Medications Prior to Admission  Medication Sig Dispense Refill  . calcium acetate (PHOSLO) 667 MG capsule Take 1,334 mg by mouth 3 (three) times daily with meals.     . carvedilol (COREG) 25 MG tablet Take 25 mg by mouth 2 (two) times daily with a meal.    . cloNIDine HCl (KAPVAY) 0.1 MG TB12 ER tablet Take 0.1 mg by mouth 2 (two) times daily.     . folic acid (FOLVITE) 1 MG tablet Take 1 mg by mouth daily.    . multivitamin (RENA-VIT) TABS tablet Take  1 tablet by mouth daily.  11  . ranitidine (ZANTAC) 300 MG capsule Take 300 mg by mouth every evening.    . rosuvastatin (CRESTOR) 10 MG tablet Take 10 mg by mouth daily.    Marland Kitchen warfarin (COUMADIN) 1 MG tablet Take 2 mg by mouth daily after supper.     . warfarin (COUMADIN) 5 MG tablet Take 5 mg by mouth daily after supper.       Results for orders placed or performed during the hospital encounter of 08/09/15 (from the past 48 hour(s))  Glucose, capillary     Status: Abnormal   Collection Time: 08/12/15 10:18 PM  Result Value Ref Range   Glucose-Capillary 160 (H) 65 - 99 mg/dL  Troponin I     Status: None   Collection Time: 08/12/15 10:44 PM  Result Value Ref Range   Troponin I <0.03 <0.031 ng/mL    Comment:        NO INDICATION OF MYOCARDIAL INJURY.   Glucose, capillary     Status: None   Collection Time: 08/13/15  8:02 AM  Result Value Ref Range   Glucose-Capillary 79 65 - 99 mg/dL  Protime-INR     Status: Abnormal   Collection Time: 08/13/15 10:29 AM  Result Value Ref Range   Prothrombin Time 22.7 (H)  11.6 - 15.2 seconds   INR 2.02 (H) 0.00 - 1.49  Troponin I     Status: None   Collection Time: 08/13/15 10:29 AM  Result Value Ref Range   Troponin I <0.03 <0.031 ng/mL    Comment:        NO INDICATION OF MYOCARDIAL INJURY.   CBC     Status: Abnormal   Collection Time: 08/13/15 10:29 AM  Result Value Ref Range   WBC 3.5 (L) 4.0 - 10.5 K/uL   RBC 2.76 (L) 3.87 - 5.11 MIL/uL   Hemoglobin 9.3 (L) 12.0 - 15.0 g/dL   HCT 28.1 (L) 36.0 - 46.0 %   MCV 101.8 (H) 78.0 - 100.0 fL   MCH 33.7 26.0 - 34.0 pg   MCHC 33.1 30.0 - 36.0 g/dL   RDW 16.7 (H) 11.5 - 15.5 %   Platelets 143 (L) 150 - 400 K/uL  Parathyroid hormone, intact (no Ca)     Status: None   Collection Time: 08/13/15 10:29 AM  Result Value Ref Range   PTH 52 15 - 65 pg/mL    Comment: (NOTE) Performed At: [facility] [address] [place], [place] [id number] Lindon Romp MD [id number]   VITAMIN  D 25 Hydroxy (Vit-D Deficiency, Fractures)     Status: Abnormal   Collection Time: 08/13/15 10:29 AM  Result Value Ref Range   Vit D, 25-Hydroxy 8.6 (L) 30.0 - 100.0 ng/mL    Comment: (NOTE) Vitamin D deficiency has been defined by the Institute of Medicine and an Endocrine Society practice guideline as a level of serum 25-OH vitamin D less than 20 ng/mL (1,2). The Endocrine Society went on to further define vitamin D insufficiency as a level between 21 and 29 ng/mL (2). 1. IOM (Institute of Medicine). 2010. Dietary reference   intakes for calcium and D. [place]: The   [employer]. 2. Holick MF, Binkley [place], Bischoff-Ferrari HA, et al.   Evaluation, treatment, and prevention of vitamin D   deficiency: an Endocrine Society clinical practice   guideline. JCEM. 2011 Jul; 96(7):1911-30. Performed At: [facility] [place], [place] [id number] Lindon Romp MD [id number]   Renal function panel     Status: Abnormal   Collection Time: 08/13/15 10:29 AM  Result Value Ref Range   Sodium 139 135 - 145 mmol/L   Potassium 3.1 (L) 3.5 - 5.1 mmol/L   Chloride 98 (L) 101 - 111 mmol/L   CO2 23 22 - 32 mmol/L   Glucose, Bld 94 65 - 99 mg/dL   BUN 46 (H) 6 - 20 mg/dL   Creatinine, Ser 8.67 (H) 0.44 - 1.00 mg/dL   Calcium 7.0 (L) 8.9 - 10.3 mg/dL   Phosphorus 4.6 2.5 - 4.6 mg/dL   Albumin 4.0 3.5 - 5.0 g/dL   GFR calc non Af Amer 6 (L) >60 mL/min   GFR calc Af Amer 7 (L) >60 mL/min    Comment: (NOTE) The eGFR has been calculated using the CKD EPI equation. This calculation has not been validated in all clinical situations. eGFR's persistently <60 mL/min signify possible Chronic Kidney Disease.    Anion gap 18 (H) 5 - 15  Glucose, capillary     Status: None   Collection Time: 08/13/15 11:45 AM  Result Value Ref Range   Glucose-Capillary 91 65 - 99 mg/dL  Glucose, capillary     Status: Abnormal   Collection Time: 08/13/15  4:14 PM  Result Value  Ref Range   Glucose-Capillary 107 (H) 65 - 99 mg/dL  Glucose, capillary     Status: Abnormal   Collection Time: 08/13/15  8:05 PM  Result Value Ref Range   Glucose-Capillary 189 (H) 65 - 99 mg/dL  Glucose, capillary     Status: None   Collection Time: 08/14/15  8:17 AM  Result Value Ref Range   Glucose-Capillary 85 65 - 99 mg/dL  Protime-INR     Status: Abnormal   Collection Time: 08/14/15 11:18 AM  Result Value Ref Range   Prothrombin Time 86.0 (H) 11.6 - 15.2 seconds   INR >10.00 (HH) 0.00 - 1.49    Comment: REPEATED TO VERIFY CRITICAL RESULT CALLED TO, READ BACK BY AND VERIFIED WITH: SERRANO,S RN @ 1203 08/14/15 LEONARD,A   Renal function panel     Status: Abnormal   Collection Time: 08/14/15 11:18 AM  Result Value Ref Range   Sodium 142 135 - 145 mmol/L   Potassium 3.3 (L) 3.5 - 5.1 mmol/L   Chloride 100 (L) 101 - 111 mmol/L   CO2 22 22 - 32 mmol/L   Glucose, Bld 96 65 - 99 mg/dL   BUN 71 (H) 6 - 20 mg/dL   Creatinine, Ser 00.44 (H) 0.44 - 1.00 mg/dL   Calcium 7.1 (L) 8.9 - 10.3 mg/dL   Phosphorus 3.9 2.5 - 4.6 mg/dL   Albumin 4.0 3.5 - 5.0 g/dL   GFR calc non Af Amer 4 (L) >60 mL/min   GFR calc Af Amer 5 (L) >60 mL/min    Comment: (NOTE) The eGFR has been calculated using the CKD EPI equation. This calculation has not been validated in all clinical situations. eGFR's persistently <60 mL/min signify possible Chronic Kidney Disease.    Anion gap 20 (H) 5 - 15  CBC     Status: Abnormal   Collection Time: 08/14/15 11:19 AM  Result Value Ref Range   WBC 3.4 (L) 4.0 - 10.5 K/uL   RBC 2.78 (L) 3.87 - 5.11 MIL/uL   Hemoglobin 8.9 (L) 12.0 - 15.0 g/dL   HCT 60.5 (L) 94.2 - 39.3 %   MCV 102.2 (H) 78.0 - 100.0 fL   MCH 32.0 26.0 - 34.0 pg   MCHC 31.3 30.0 - 36.0 g/dL   RDW 31.6 (H) 39.2 - 29.4 %   Platelets 143 (L) 150 - 400 K/uL  Protime-INR     Status: Abnormal   Collection Time: 08/14/15 12:30 PM  Result Value Ref Range   Prothrombin Time 22.5 (H) 11.6 - 15.2  seconds   INR 2.00 (H) 0.00 - 1.49  Glucose, capillary     Status: None   Collection Time: 08/14/15  3:27 PM  Result Value Ref Range   Glucose-Capillary 79 65 - 99 mg/dL   No results found.   Blood pressure 137/91, pulse 88, temperature 98.6 F (37 C), temperature source Oral, resp. rate 16, height 5\' 3"  (1.6 m), weight 49 kg (108 lb 0.4 oz), SpO2 94 %.  PHYSICAL EXAM: Overall appearance:  Small of stature, with juvenile affect.  Cheeks puffy which mother reports is not her baseline. Head:  NCAT Ears: waxy LEFT.  Clear RIGHT> Nose:  Sl dry and excoriated bilat. Oral Cavity:  Moist, teeth in good repair Oral Pharynx/Hypopharynx/Larynx:  Oropharynx clear.  Hypopharynx/Larynx with flexible laryngoscope shows mild epiglottic edema.  Moderate to large post cricoid laryngeal edema.  Vocal cords fully mobile and glottic airway good.  Occasional "brassy" cough.  No pooling in pyriforms  or valleculae. Neuro:  Grossly intact Neck:  No masses.  With informed consent, using 5 ml 2% viscous xylocaine for topical anesthesia, the flexible laryngoscope was introduced through the LEFT nose. The nasopharynx is clear with normal eustachian tori.  OP/base of tongue clear.  HP/Larynx with findings as described above.    Assessment/Plan Supraglottic laryngeal edema, especially post cricoid, probably a combination of intubation trauma, GERD, and possible fluid retention c/w dialysis status.  Vocal cords fully mobile, and glottic airway good.  I am not particulary impressed by the efficacy of H2 blockers for GERD.  Between trauma, Prednisone, ESRD I would favor PPI's for the near future, bid ac.  If she is clinically improving, could discharge home at any time.  Recheck my office as needed.   Jodi Marble 2/88/3374, 6:33 PM

## 2015-08-15 LAB — RENAL FUNCTION PANEL
ANION GAP: 16 — AB (ref 5–15)
Albumin: 3.9 g/dL (ref 3.5–5.0)
BUN: 41 mg/dL — ABNORMAL HIGH (ref 6–20)
CHLORIDE: 100 mmol/L — AB (ref 101–111)
CO2: 23 mmol/L (ref 22–32)
CREATININE: 7.84 mg/dL — AB (ref 0.44–1.00)
Calcium: 7.4 mg/dL — ABNORMAL LOW (ref 8.9–10.3)
GFR, EST AFRICAN AMERICAN: 7 mL/min — AB (ref >60)
GFR, EST NON AFRICAN AMERICAN: 6 mL/min — AB (ref >60)
Glucose, Bld: 113 mg/dL — ABNORMAL HIGH (ref 65–99)
POTASSIUM: 3 mmol/L — AB (ref 3.5–5.1)
Phosphorus: 4 mg/dL (ref 2.5–4.6)
Sodium: 139 mmol/L (ref 135–145)

## 2015-08-15 LAB — GLUCOSE, CAPILLARY
GLUCOSE-CAPILLARY: 100 mg/dL — AB (ref 65–99)
GLUCOSE-CAPILLARY: 81 mg/dL (ref 65–99)
GLUCOSE-CAPILLARY: 77 mg/dL (ref 65–99)
Glucose-Capillary: 114 mg/dL — ABNORMAL HIGH (ref 65–99)

## 2015-08-15 MED ORDER — FENTANYL CITRATE (PF) 100 MCG/2ML IJ SOLN
INTRAMUSCULAR | Status: AC
  Filled 2015-08-15: qty 2

## 2015-08-15 MED ORDER — PANTOPRAZOLE SODIUM 40 MG PO TBEC: 40.0000 mg | DELAYED_RELEASE_TABLET | Freq: Two times a day (BID) | ORAL | Status: DC

## 2015-08-15 NOTE — Progress Notes (Signed)
Discharge instructions and medications discussed with patient.  All questions answered.  

## 2015-08-15 NOTE — Progress Notes (Signed)
Patient refused labs this morning.

## 2015-08-16 ENCOUNTER — Emergency Department (HOSPITAL_COMMUNITY): Payer: Medicare Other

## 2015-08-16 ENCOUNTER — Emergency Department (HOSPITAL_COMMUNITY)
Admission: EM | Admit: 2015-08-16 | Discharge: 2015-08-16 | Disposition: A | Discharge status: Home / Self Care | Payer: Medicare Other | Attending: Emergency Medicine | Admitting: Emergency Medicine

## 2015-08-16 ENCOUNTER — Encounter (HOSPITAL_COMMUNITY): Payer: Self-pay | Admitting: *Deleted

## 2015-08-16 DIAGNOSIS — F419 Anxiety disorder, unspecified: Secondary | ICD-10-CM | POA: Insufficient documentation

## 2015-08-16 DIAGNOSIS — Z86711 Personal history of pulmonary embolism: Secondary | ICD-10-CM | POA: Diagnosis not present

## 2015-08-16 DIAGNOSIS — R079 Chest pain, unspecified: Secondary | ICD-10-CM | POA: Diagnosis not present

## 2015-08-16 DIAGNOSIS — R0602 Shortness of breath: Secondary | ICD-10-CM | POA: Diagnosis not present

## 2015-08-16 DIAGNOSIS — R059 Cough, unspecified: Secondary | ICD-10-CM

## 2015-08-16 DIAGNOSIS — I120 Hypertensive chronic kidney disease with stage 5 chronic kidney disease or end stage renal disease: Secondary | ICD-10-CM | POA: Insufficient documentation

## 2015-08-16 DIAGNOSIS — D649 Anemia, unspecified: Secondary | ICD-10-CM | POA: Insufficient documentation

## 2015-08-16 DIAGNOSIS — R05 Cough: Secondary | ICD-10-CM | POA: Diagnosis not present

## 2015-08-16 DIAGNOSIS — Z940 Kidney transplant status: Secondary | ICD-10-CM | POA: Diagnosis not present

## 2015-08-16 DIAGNOSIS — Z992 Dependence on renal dialysis: Secondary | ICD-10-CM | POA: Insufficient documentation

## 2015-08-16 DIAGNOSIS — Z91040 Latex allergy status: Secondary | ICD-10-CM | POA: Insufficient documentation

## 2015-08-16 DIAGNOSIS — E785 Hyperlipidemia, unspecified: Secondary | ICD-10-CM | POA: Diagnosis not present

## 2015-08-16 DIAGNOSIS — N186 End stage renal disease: Secondary | ICD-10-CM | POA: Diagnosis not present

## 2015-08-16 DIAGNOSIS — Z79899 Other long term (current) drug therapy: Secondary | ICD-10-CM | POA: Diagnosis not present

## 2015-08-16 DIAGNOSIS — Z7901 Long term (current) use of anticoagulants: Secondary | ICD-10-CM | POA: Diagnosis not present

## 2015-08-16 DIAGNOSIS — K219 Gastro-esophageal reflux disease without esophagitis: Secondary | ICD-10-CM | POA: Insufficient documentation

## 2015-08-16 LAB — CBC WITH DIFFERENTIAL/PLATELET
Basophils Absolute: 0 10*3/uL (ref 0.0–0.1)
Basophils Relative: 0 %
EOS ABS: 0.1 10*3/uL (ref 0.0–0.7)
Eosinophils Relative: 2 %
HEMATOCRIT: 33.1 % — AB (ref 36.0–46.0)
HEMOGLOBIN: 10.2 g/dL — AB (ref 12.0–15.0)
LYMPHS ABS: 0.8 10*3/uL (ref 0.7–4.0)
LYMPHS PCT: 17 %
MCH: 32 pg (ref 26.0–34.0)
MCHC: 30.8 g/dL (ref 30.0–36.0)
MCV: 103.8 fL — AB (ref 78.0–100.0)
MONOS PCT: 7 %
Monocytes Absolute: 0.3 10*3/uL (ref 0.1–1.0)
NEUTROS ABS: 3.4 10*3/uL (ref 1.7–7.7)
NEUTROS PCT: 74 %
Platelets: 169 10*3/uL (ref 150–400)
RBC: 3.19 MIL/uL — AB (ref 3.87–5.11)
RDW: 16.6 % — ABNORMAL HIGH (ref 11.5–15.5)
WBC: 4.6 10*3/uL (ref 4.0–10.5)

## 2015-08-16 LAB — I-STAT TROPONIN, ED: Troponin i, poc: 0 ng/mL (ref 0.00–0.08)

## 2015-08-16 LAB — BASIC METABOLIC PANEL
Anion gap: 15 (ref 5–15)
BUN: 14 mg/dL (ref 6–20)
CHLORIDE: 102 mmol/L (ref 101–111)
CO2: 27 mmol/L (ref 22–32)
CREATININE: 3.47 mg/dL — AB (ref 0.44–1.00)
Calcium: 8.4 mg/dL — ABNORMAL LOW (ref 8.9–10.3)
GFR calc Af Amer: 20 mL/min — ABNORMAL LOW (ref >60)
GFR calc non Af Amer: 17 mL/min — ABNORMAL LOW (ref >60)
Glucose, Bld: 64 mg/dL — ABNORMAL LOW (ref 65–99)
POTASSIUM: 2.8 mmol/L — AB (ref 3.5–5.1)
SODIUM: 144 mmol/L (ref 135–145)

## 2015-08-16 LAB — PROTIME-INR
INR: 1.62 — AB (ref 0.00–1.49)
PROTHROMBIN TIME: 19.3 s — AB (ref 11.6–15.2)

## 2015-08-16 MED ORDER — EPINEPHRINE 0.3 MG/0.3ML IJ SOAJ: 0.3000 mg | Freq: Once | INTRAMUSCULAR | Status: AC

## 2015-08-16 MED ORDER — LORAZEPAM 2 MG/ML IJ SOLN
1.0000 mg | Freq: Once | INTRAMUSCULAR | Status: AC
  Administered 2015-08-16: 1 mg via INTRAVENOUS
  Filled 2015-08-16: qty 1

## 2015-08-16 MED ORDER — ACETAMINOPHEN 325 MG PO TABS
650.0000 mg | ORAL_TABLET | Freq: Once | ORAL | Status: AC
  Administered 2015-08-16: 650 mg via ORAL
  Filled 2015-08-16: qty 2

## 2015-08-16 MED ORDER — RANITIDINE HCL 150 MG/10ML PO SYRP
150.0000 mg | ORAL_SOLUTION | Freq: Once | ORAL | Status: AC
  Administered 2015-08-16: 150 mg via ORAL
  Filled 2015-08-16: qty 10

## 2015-08-16 MED ORDER — BENZONATATE 100 MG PO CAPS
100.0000 mg | ORAL_CAPSULE | Freq: Once | ORAL | Status: AC
  Administered 2015-08-16: 100 mg via ORAL
  Filled 2015-08-16: qty 1

## 2015-08-16 MED ORDER — BENZONATATE 100 MG PO CAPS: 100.0000 mg | ORAL_CAPSULE | Freq: Three times a day (TID) | ORAL | Status: DC

## 2015-08-16 MED ORDER — POTASSIUM CHLORIDE 10 MEQ/100ML IV SOLN
10.0000 meq | Freq: Once | INTRAVENOUS | Status: AC
  Administered 2015-08-16: 10 meq via INTRAVENOUS
  Filled 2015-08-16: qty 100

## 2015-08-16 MED ORDER — DEXTROSE 10 % IV BOLUS: 5.0000 mL/kg | Freq: Once | INTRAVENOUS | Status: DC

## 2015-08-16 NOTE — ED Notes (Addendum)
Pt completed her dialysis and began feeling sob, chest tightness and coughing.  Was intubated last Fri for sob allergic rxn.  Given a total of 50 mg benadryl, 125 mg solumedrol and 0.3 IM epi with no change.  Breathing labored, pt c/o chest pain.  Pt ao x 4.  VS 114/76, 96 hr, 98% RA, 22.

## 2015-08-16 NOTE — ED Provider Notes (Signed)
CSN: [id number]     Arrival date & time 08/16/15  1030 History   First MD Initiated Contact with Patient 08/16/15 1033     Chief Complaint  Patient presents with  . Allergic Reaction   Patient is a 25 y.o. female presenting with shortness of breath.  Shortness of Breath Severity:  Severe Onset quality:  Sudden Duration:  1 hour Timing:  Constant Progression:  Partially resolved Chronicity:  Recurrent Context: emotional upset   Context: not activity and not known allergens   Relieved by:  None tried Worsened by:  Nothing tried Ineffective treatments:  None tried Associated symptoms: chest pain and cough   Associated symptoms: no fever, no headaches, no neck pain, no rash, no sore throat, no sputum production, no vomiting and no wheezing     Past Medical History  Diagnosis Date  . Anemia   . ESRD (end stage renal disease) ([place]) Feb. 12, 2010    First date of Dialysis   . Secondary hyperparathyroidism ([place])   . Pulmonary emboli ([place])   . Hypertension   . GERD (gastroesophageal reflux disease)   . Hyperlipidemia   . Focal segmental glomerulosclerosis    Past Surgical History  Procedure Laterality Date  . Kidney transplant     No family history on file. Social History  Substance Use Topics  . Smoking status: Never Smoker   . Smokeless tobacco: Never Used  . Alcohol Use: No   OB History    No data available     Review of Systems  Constitutional: Negative for fever, chills, activity change and appetite change.  HENT: Negative for congestion and sore throat.   Eyes: Negative for pain, discharge, itching and visual disturbance.  Respiratory: Positive for cough and shortness of breath. Negative for sputum production and wheezing.   Cardiovascular: Positive for chest pain. Negative for palpitations and leg swelling.  Gastrointestinal: Negative for vomiting.  Endocrine: Negative for polydipsia, polyphagia and polyuria.  Genitourinary: Negative for dysuria, frequency,  flank pain, difficulty urinating and dyspareunia.  Musculoskeletal: Negative for back pain, joint swelling, gait problem and neck pain.  Skin: Negative for rash.  Neurological: Negative for syncope, speech difficulty, weakness, numbness and headaches.  Hematological: Negative for adenopathy.  Psychiatric/Behavioral: Negative for behavioral problems and agitation. The patient is nervous/anxious.   All other systems reviewed and are negative.     Allergies  Ferrlecit; Cefazolin; Cyclosporine; Lactose intolerance (gi); Latex; and Vancomycin  Home Medications   Prior to Admission medications   Medication Sig Start Date End Date Taking? Authorizing Provider  calcium acetate (PHOSLO) 667 MG capsule Take 1,334 mg by mouth 3 (three) times daily with meals.    Yes Historical Provider, MD  carvedilol (COREG) 25 MG tablet Take 25 mg by mouth 2 (two) times daily with a meal.   Yes Historical Provider, MD  cloNIDine HCl (KAPVAY) 0.1 MG TB12 ER tablet Take 0.1 mg by mouth 2 (two) times daily.    Yes Historical Provider, MD  diphenhydrAMINE (BENADRYL) 25 mg capsule Take 1 capsule (25 mg total) by mouth every 6 (six) hours as needed for allergies. 08/14/15  Yes Shanker Kristeen Mans, MD  folic acid (FOLVITE) 1 MG tablet Take 1 mg by mouth daily.   Yes Historical Provider, MD  multivitamin (RENA-VIT) TABS tablet Take 1 tablet by mouth daily. 02/12/15  Yes Historical Provider, MD  pantoprazole (PROTONIX) 40 MG tablet Take 1 tablet (40 mg total) by mouth 2 (two) times daily before a meal. 08/15/15  Yes Shanker Kristeen Mans, MD  predniSONE (DELTASONE) 10 MG tablet Take 1 tablet (10 mg total) by mouth daily with breakfast. For 2 days and then stop 08/16/15  Yes Shanker Kristeen Mans, MD  ranitidine (ZANTAC) 300 MG capsule Take 300 mg by mouth every evening.   Yes Historical Provider, MD  rosuvastatin (CRESTOR) 10 MG tablet Take 10 mg by mouth daily.   Yes Historical Provider, MD  warfarin (COUMADIN) 1 MG tablet Take 2 mg by  mouth daily after supper.    Yes Historical Provider, MD  warfarin (COUMADIN) 5 MG tablet Take 5 mg by mouth daily after supper.    Yes Historical Provider, MD  benzonatate (TESSALON) 100 MG capsule Take 1 capsule (100 mg total) by mouth every 8 (eight) hours. 08/16/15   Vira Blanco, MD  EPINEPHrine 0.3 mg/0.3 mL IJ SOAJ injection Inject 0.3 mLs (0.3 mg total) into the muscle once. 08/16/15   Vira Blanco, MD   BP 105/86 mmHg  Pulse 96  Resp 10  SpO2 92%  LMP 06/15/2015 Physical Exam  Constitutional: She is oriented to person, place, and time. She appears well-nourished. She appears distressed.  HENT:  Head: Normocephalic and atraumatic.  Right Ear: External ear normal.  Left Ear: External ear normal.  Eyes: Pupils are equal, round, and reactive to light. Right eye exhibits no discharge. Left eye exhibits no discharge.  Neck: Normal range of motion. Neck supple. No JVD present. No tracheal deviation present. No thyromegaly present.  Cardiovascular: Normal rate, regular rhythm and intact distal pulses.  Exam reveals no friction rub.   No murmur heard. Pulmonary/Chest: No stridor. No respiratory distress. She has no wheezes. She has no rales. She exhibits no tenderness.  tachypnea  Abdominal: Soft. Bowel sounds are normal. She exhibits no distension. There is no tenderness. There is no rebound and no guarding.  Musculoskeletal: Normal range of motion. She exhibits no edema.  Neurological: She is alert and oriented to person, place, and time. No cranial nerve deficit. Coordination normal.  Skin: Skin is warm and dry. No rash noted. She is not diaphoretic. No erythema.  Psychiatric: She has a normal mood and affect. Her behavior is normal.    ED Course  Procedures (including critical care time) Labs Review Labs Reviewed  CBC WITH DIFFERENTIAL/PLATELET - Abnormal; Notable for the following:    RBC 3.19 (*)    Hemoglobin 10.2 (*)    HCT 33.1 (*)    MCV 103.8 (*)    RDW 16.6 (*)     All other components within normal limits  BASIC METABOLIC PANEL - Abnormal; Notable for the following:    Potassium 2.8 (*)    Glucose, Bld 64 (*)    Creatinine, Ser 3.47 (*)    Calcium 8.4 (*)    GFR calc non Af Amer 17 (*)    GFR calc Af Amer 20 (*)    All other components within normal limits  PROTIME-INR - Abnormal; Notable for the following:    Prothrombin Time 19.3 (*)    INR 1.62 (*)    All other components within normal limits  Randolm Idol, ED    Imaging Review Dg Chest Port 1 View  08/16/2015  CLINICAL DATA:  Shortness of breath, chest tightness and coughing after dialysis. EXAM: PORTABLE CHEST 1 VIEW COMPARISON:  08/12/2015 FINDINGS: Again noted is a left sided dialysis catheter. The tip of the catheter is at the superior cavoatrial junction. The catheter extends through a vascular stent in the  left innominate vein and SVC. The lungs are clear without airspace disease or pulmonary edema. Again noted is focal lucency at the left lung base which similar to the previous examination. Heart size is within limits and stable. The trachea is midline. IMPRESSION: No acute chest findings. Prominent lucency at the left lung base is similar to the prior examination. A deep sulcus sign and anterior pneumothorax is thought to be unlikely. If there is clinical concern for an occult left pneumothorax with symptoms in the left chest, consider further evaluation with decubitus images. Stable position of the dialysis catheter. Electronically Signed   By: Markus Daft M.D.   On: 08/16/2015 11:07   I have personally reviewed and evaluated these images and lab results as part of my medical decision-making.   EKG Interpretation   Date/Time:  Friday August 16 2015 10:36:09 EST Ventricular Rate:  106 PR Interval:  135 QRS Duration: 105 QT Interval:  389 QTC Calculation: 517 R Axis:   97 Text Interpretation:  Sinus tachycardia Borderline right axis deviation  Nonspecific T abnormalities,  lateral leads Prolonged QT interval Baseline  wander in lead(s) V6 Confirmed by Ashok Cordia  MD, Lennette Bihari ([id number]) on 08/16/2015  11:01:08 AM      MDM   Final diagnoses:  Cough  Shortness of breath    Patient is a 25 year old female who presents via EMS for evaluation of shortness of breath, chest pain, dry cough. The patient was seen here last Friday with an allergic reaction to IV iron. She was intubated at that point was admitted to the hospital. She was discharged 2 days ago and went to hemodialysis today. She had normal dialysis session. Following dialysis she began to feel short of breath. This was her only new symptom today. She was given 0.3 mg IM epinephrine, 25 mg Benadryl, 125 mg Solu-Medrol. Upon arrival patient was anxious, tachypneic to 30. She was able to speak in full sentences had a good blood pressure and was satting 98% on room air.  Chest pain chronic per patient and cough since discharge. Shortness of breath only new symptom. Did not suspect allergic reaction as patient had no swelling of her lips, tongue, cheek. There is no wheezing on exam. I did give Zantac upon arrival to finish off allergic reaction cocktail.  Patient given Ladona Ridgel for cough and Ativan for anxiety.  Chest x-ray insistent with prior. CBC unremarkable. BMP with potassium 2.8, glucose 64. Patient given 5 mL/kg of D10 as well as one run of IV potassium. I will be gentle with potassium given recent dialysis.  12:22 PM: Patient reevaluated and was sleeping comfortably in no acute distress. I updated mother of lab results at bedside and plan for another 1-2 hours of observation before disposition decision.  2:17 PM: Patient reevaluated. She has chest pain that is similar to her chest pain the prior 6 years. Will give Tylenol prior to discharge. Troponin negative, chest x-ray similar to prior. Patient discharged with prescription for [facility] as needed for cough, EpiPen for significant allergic  reaction.  Patient able to eat, drink, ambulatory prior to discharge.  Discussed w/ mother at bedside.  To follow-up with PCP.   Discussed w/ attending, Dr. Ashok Cordia.    Vira Blanco, MD 08/16/15 [place], MD 08/17/15 [phone]

## 2015-08-16 NOTE — Discharge Instructions (Signed)
Please take Tylenol as needed for chest pain at home. Tessalon Perles as needed for cough. Continue to use steroid taper and Benadryl as prescribed from last hospital visit.  Please fill EpiPen and have it available available for severe allergic reaction. If you feel the need to use EpiPen please call EMS and presented to an emergency department immediately.  Cough, Adult Coughing is a reflex that clears your throat and your airways. Coughing helps to heal and protect your lungs. It is normal to cough occasionally, but a cough that happens with other symptoms or lasts a long time may be a sign of a condition that needs treatment. A cough may last only 2-3 weeks (acute), or it may last longer than 8 weeks (chronic). CAUSES Coughing is commonly caused by:  Breathing in substances that irritate your lungs.  A viral or bacterial respiratory infection.  Allergies.  Asthma.  Postnasal drip.  Smoking.  Acid backing up from the stomach into the esophagus (gastroesophageal reflux).  Certain medicines.  Chronic lung problems, including COPD (or rarely, lung cancer).  Other medical conditions such as heart failure. HOME CARE INSTRUCTIONS  Pay attention to any changes in your symptoms. Take these actions to help with your discomfort:  Take medicines only as told by your health care provider.  If you were prescribed an antibiotic medicine, take it as told by your health care provider. Do not stop taking the antibiotic even if you start to feel better.  Talk with your health care provider before you take a cough suppressant medicine.  Drink enough fluid to keep your urine clear or pale yellow.  If the air is dry, use a cold steam vaporizer or humidifier in your bedroom or your home to help loosen secretions.  Avoid anything that causes you to cough at work or at home.  If your cough is worse at night, try sleeping in a semi-upright position.  Avoid cigarette smoke. If you smoke, quit  smoking. If you need help quitting, ask your health care provider.  Avoid caffeine.  Avoid alcohol.  Rest as needed. SEEK MEDICAL CARE IF:   You have new symptoms.  You cough up pus.  Your cough does not get better after 2-3 weeks, or your cough gets worse.  You cannot control your cough with suppressant medicines and you are losing sleep.  You develop pain that is getting worse or pain that is not controlled with pain medicines.  You have a fever.  You have unexplained weight loss.  You have night sweats. SEEK IMMEDIATE MEDICAL CARE IF:  You cough up blood.  You have difficulty breathing.  Your heartbeat is very fast.   This information is not intended to replace advice given to you by your health care provider. Make sure you discuss any questions you have with your health care provider.   Document Released: 12/12/2010 Document Revised: 03/06/2015 Document Reviewed: 08/22/2014 Elsevier Interactive Patient Education [employer].

## 2015-08-16 NOTE — ED Notes (Signed)
Pt ambulated well with no problems

## 2015-08-17 ENCOUNTER — Emergency Department (HOSPITAL_BASED_OUTPATIENT_CLINIC_OR_DEPARTMENT_OTHER): Payer: Medicare Other

## 2015-08-17 ENCOUNTER — Emergency Department (HOSPITAL_BASED_OUTPATIENT_CLINIC_OR_DEPARTMENT_OTHER)
Admission: EM | Admit: 2015-08-17 | Discharge: 2015-08-17 | Disposition: A | Discharge status: Home / Self Care | Payer: Medicare Other | Attending: Emergency Medicine | Admitting: Emergency Medicine

## 2015-08-17 ENCOUNTER — Encounter (HOSPITAL_BASED_OUTPATIENT_CLINIC_OR_DEPARTMENT_OTHER): Payer: Self-pay | Admitting: *Deleted

## 2015-08-17 DIAGNOSIS — R000 Tachycardia, unspecified: Secondary | ICD-10-CM | POA: Diagnosis not present

## 2015-08-17 DIAGNOSIS — I120 Hypertensive chronic kidney disease with stage 5 chronic kidney disease or end stage renal disease: Secondary | ICD-10-CM | POA: Diagnosis not present

## 2015-08-17 DIAGNOSIS — Y92093 Driveway of other non-institutional residence as the place of occurrence of the external cause: Secondary | ICD-10-CM | POA: Insufficient documentation

## 2015-08-17 DIAGNOSIS — S0990XA Unspecified injury of head, initial encounter: Secondary | ICD-10-CM | POA: Diagnosis present

## 2015-08-17 DIAGNOSIS — Z992 Dependence on renal dialysis: Secondary | ICD-10-CM | POA: Insufficient documentation

## 2015-08-17 DIAGNOSIS — N186 End stage renal disease: Secondary | ICD-10-CM | POA: Diagnosis not present

## 2015-08-17 DIAGNOSIS — Y998 Other external cause status: Secondary | ICD-10-CM | POA: Diagnosis not present

## 2015-08-17 DIAGNOSIS — E785 Hyperlipidemia, unspecified: Secondary | ICD-10-CM | POA: Diagnosis not present

## 2015-08-17 DIAGNOSIS — S0083XA Contusion of other part of head, initial encounter: Secondary | ICD-10-CM

## 2015-08-17 DIAGNOSIS — Z79899 Other long term (current) drug therapy: Secondary | ICD-10-CM | POA: Diagnosis not present

## 2015-08-17 DIAGNOSIS — K219 Gastro-esophageal reflux disease without esophagitis: Secondary | ICD-10-CM | POA: Diagnosis not present

## 2015-08-17 DIAGNOSIS — Y9389 Activity, other specified: Secondary | ICD-10-CM | POA: Diagnosis not present

## 2015-08-17 DIAGNOSIS — Z86711 Personal history of pulmonary embolism: Secondary | ICD-10-CM | POA: Diagnosis not present

## 2015-08-17 DIAGNOSIS — Z7901 Long term (current) use of anticoagulants: Secondary | ICD-10-CM | POA: Diagnosis not present

## 2015-08-17 DIAGNOSIS — W19XXXA Unspecified fall, initial encounter: Secondary | ICD-10-CM

## 2015-08-17 DIAGNOSIS — D649 Anemia, unspecified: Secondary | ICD-10-CM | POA: Diagnosis not present

## 2015-08-17 DIAGNOSIS — Z91040 Latex allergy status: Secondary | ICD-10-CM | POA: Diagnosis not present

## 2015-08-17 DIAGNOSIS — W01198A Fall on same level from slipping, tripping and stumbling with subsequent striking against other object, initial encounter: Secondary | ICD-10-CM | POA: Insufficient documentation

## 2015-08-17 LAB — CBC WITH DIFFERENTIAL/PLATELET
BASOS ABS: 0 10*3/uL (ref 0.0–0.1)
BASOS PCT: 0 %
EOS PCT: 1 %
Eosinophils Absolute: 0 10*3/uL (ref 0.0–0.7)
HEMATOCRIT: 30.6 % — AB (ref 36.0–46.0)
Hemoglobin: 9.3 g/dL — ABNORMAL LOW (ref 12.0–15.0)
LYMPHS PCT: 15 %
Lymphs Abs: 0.9 10*3/uL (ref 0.7–4.0)
MCH: 32.7 pg (ref 26.0–34.0)
MCHC: 30.4 g/dL (ref 30.0–36.0)
MCV: 107.7 fL — AB (ref 78.0–100.0)
Monocytes Absolute: 0.4 10*3/uL (ref 0.1–1.0)
Monocytes Relative: 7 %
NEUTROS ABS: 5 10*3/uL (ref 1.7–7.7)
Neutrophils Relative %: 77 %
PLATELETS: 195 10*3/uL (ref 150–400)
RBC: 2.84 MIL/uL — AB (ref 3.87–5.11)
RDW: 16.5 % — AB (ref 11.5–15.5)
WBC: 6.3 10*3/uL (ref 4.0–10.5)

## 2015-08-17 LAB — BASIC METABOLIC PANEL
ANION GAP: 17 — AB (ref 5–15)
BUN: 50 mg/dL — ABNORMAL HIGH (ref 6–20)
CALCIUM: 6.8 mg/dL — AB (ref 8.9–10.3)
CO2: 20 mmol/L — ABNORMAL LOW (ref 22–32)
Chloride: 106 mmol/L (ref 101–111)
Creatinine, Ser: 7.37 mg/dL — ABNORMAL HIGH (ref 0.44–1.00)
GFR calc Af Amer: 8 mL/min — ABNORMAL LOW (ref >60)
GFR, EST NON AFRICAN AMERICAN: 7 mL/min — AB (ref >60)
Glucose, Bld: 154 mg/dL — ABNORMAL HIGH (ref 65–99)
POTASSIUM: 2.9 mmol/L — AB (ref 3.5–5.1)
SODIUM: 143 mmol/L (ref 135–145)

## 2015-08-17 MED ORDER — HEPARIN SODIUM (PORCINE) 5000 UNIT/ML IJ SOLN
5000.0000 [IU] | Freq: Once | INTRAMUSCULAR | Status: AC
  Administered 2015-08-17: 5000 [IU] via SUBCUTANEOUS
  Filled 2015-08-17: qty 1

## 2015-08-17 NOTE — Discharge Instructions (Signed)
Workup here today without any significant findings. Based on your potassium and chest x-ray no urgent need for dialysis today. No evidence of any fluid overload in the lungs potassium is actually on the low side at 2.9. Workup for the fall head CT and CT of face without any bony or brain injury. Follow-up with dialysis as scheduled. Return for any new or worse symptoms.

## 2015-08-17 NOTE — ED Provider Notes (Signed)
CSN: [id number]     Arrival date & time 08/17/15  1313 History   First MD Initiated Contact with Patient 08/17/15 1406     Chief Complaint  Patient presents with  . Shortness of Breath  . Fall     (Consider location/radiation/quality/duration/timing/severity/associated sxs/prior Treatment) Patient is a 25 y.o. female presenting with shortness of breath and fall. The history is provided by the patient and a parent.  Shortness of Breath Associated symptoms: headaches   Associated symptoms: no abdominal pain, no chest pain, no fever, no neck pain and no rash   Fall Associated symptoms include headaches and shortness of breath. Pertinent negatives include no chest pain and no abdominal pain.   patient status post fall in the driveway no loss of consciousness. Landed on the right side of her face and right forehead. Patient was on her way to dialysis. Patient is a dialysis patient. Patient last dialyzed yesterday, they weren't able to complete dialysis because her blood pressures Low so was stopped and she was to return today to finish dialysis. They were on their way to dialysis when she fell. Patient with complaint of right-sided face pain right for head pain no neck pain no back pain. As stated no loss of consciousness. Patient's also been complaining of shortness of breath.  Past Medical History  Diagnosis Date  . Anemia   . ESRD (end stage renal disease) ([place]) Feb. 12, 2010    First date of Dialysis   . Secondary hyperparathyroidism ([place])   . Pulmonary emboli ([place])   . Hypertension   . GERD (gastroesophageal reflux disease)   . Hyperlipidemia   . Focal segmental glomerulosclerosis    Past Surgical History  Procedure Laterality Date  . Kidney transplant     No family history on file. Social History  Substance Use Topics  . Smoking status: Never Smoker   . Smokeless tobacco: Never Used  . Alcohol Use: No   OB History    No data available     Review of Systems   Constitutional: Negative for fever.  HENT: Negative for congestion.   Eyes: Negative for redness.  Respiratory: Positive for shortness of breath.   Cardiovascular: Negative for chest pain.  Gastrointestinal: Negative for abdominal pain.  Musculoskeletal: Negative for back pain and neck pain.  Skin: Negative for rash and wound.  Neurological: Positive for headaches.  Hematological: Bruises/bleeds easily.  Psychiatric/Behavioral: Negative for confusion.      Allergies  Ferrlecit; Cefazolin; Cyclosporine; Lactose intolerance (gi); Latex; and Vancomycin  Home Medications   Prior to Admission medications   Medication Sig Start Date End Date Taking? Authorizing Provider  benzonatate (TESSALON) 100 MG capsule Take 1 capsule (100 mg total) by mouth every 8 (eight) hours. 08/16/15   Vira Blanco, MD  calcium acetate (PHOSLO) 667 MG capsule Take 1,334 mg by mouth 3 (three) times daily with meals.     Historical Provider, MD  carvedilol (COREG) 25 MG tablet Take 25 mg by mouth 2 (two) times daily with a meal.    Historical Provider, MD  cloNIDine HCl (KAPVAY) 0.1 MG TB12 ER tablet Take 0.1 mg by mouth 2 (two) times daily.     Historical Provider, MD  diphenhydrAMINE (BENADRYL) 25 mg capsule Take 1 capsule (25 mg total) by mouth every 6 (six) hours as needed for allergies. 08/14/15   Shanker Kristeen Mans, MD  EPINEPHrine 0.3 mg/0.3 mL IJ SOAJ injection Inject 0.3 mLs (0.3 mg total) into the muscle once. 08/16/15  Vira Blanco, MD  folic acid (FOLVITE) 1 MG tablet Take 1 mg by mouth daily.    Historical Provider, MD  multivitamin (RENA-VIT) TABS tablet Take 1 tablet by mouth daily. 02/12/15   Historical Provider, MD  pantoprazole (PROTONIX) 40 MG tablet Take 1 tablet (40 mg total) by mouth 2 (two) times daily before a meal. 08/15/15   Jonetta Osgood, MD  predniSONE (DELTASONE) 10 MG tablet Take 1 tablet (10 mg total) by mouth daily with breakfast. For 2 days and then stop 08/16/15   Jonetta Osgood, MD  ranitidine (ZANTAC) 300 MG capsule Take 300 mg by mouth every evening.    Historical Provider, MD  rosuvastatin (CRESTOR) 10 MG tablet Take 10 mg by mouth daily.    Historical Provider, MD  warfarin (COUMADIN) 1 MG tablet Take 2 mg by mouth daily after supper.     Historical Provider, MD  warfarin (COUMADIN) 5 MG tablet Take 5 mg by mouth daily after supper.     Historical Provider, MD   BP 128/81 mmHg  Pulse 111  Temp(Src) 97.5 F (36.4 C) (Oral)  Resp 18  Ht 5\' 2"  (1.575 m)  Wt 48.988 kg  BMI 19.75 kg/m2  SpO2 97%  LMP 06/15/2015 Physical Exam  Constitutional: She is oriented to person, place, and time. She appears well-developed and well-nourished. No distress.  HENT:  Head: Normocephalic.  Mouth/Throat: Oropharynx is clear and moist.  Swelling to the right side of the face. No abrasions or lacerations.  Eyes: Conjunctivae and EOM are normal. Pupils are equal, round, and reactive to light.  Neck: Normal range of motion. Neck supple.  Cardiovascular: Regular rhythm and normal heart sounds.   Slightly tachycardic  Pulmonary/Chest: Effort normal and breath sounds normal.  Dialysis catheter left subclavian area  Abdominal: Soft. Bowel sounds are normal. There is no tenderness.  Musculoskeletal: Normal range of motion.  Dialysis catheter left subclavian area  Neurological: She is alert and oriented to person, place, and time.  Skin: Skin is warm.  Nursing note and vitals reviewed.   ED Course  Procedures (including critical care time) Labs Review Labs Reviewed  BASIC METABOLIC PANEL - Abnormal; Notable for the following:    Potassium 2.9 (*)    CO2 20 (*)    Glucose, Bld 154 (*)    BUN 50 (*)    Creatinine, Ser 7.37 (*)    Calcium 6.8 (*)    GFR calc non Af Amer 7 (*)    GFR calc Af Amer 8 (*)    Anion gap 17 (*)    All other components within normal limits  CBC WITH DIFFERENTIAL/PLATELET - Abnormal; Notable for the following:    RBC 2.84 (*)     Hemoglobin 9.3 (*)    HCT 30.6 (*)    MCV 107.7 (*)    RDW 16.5 (*)    All other components within normal limits    Imaging Review Dg Chest 2 View  08/17/2015  CLINICAL DATA:  Fall today EXAM: CHEST  2 VIEW COMPARISON:  None. FINDINGS: Left jugular tunneled dialysis catheter is stable. Normal heart size. Clear lungs. No obvious acute bony deformity. No pneumothorax or pleural effusion. IMPRESSION: No active cardiopulmonary disease. Electronically Signed   By: Marybelle Killings M.D.   On: 08/17/2015 14:46   Ct Head Wo Contrast  08/17/2015  CLINICAL DATA:  Pt's mother reports pt was extubated this past Sunday and discharge from hospital this past Wednesday. Last dialysis treatment was  yesterday but they were unable to pull off fluid at that time d/t hypotension. Pt was leaving home today around 1300 to go to dialysis when she fell in the driveway. Reports hitting her head (denies LOC, no injury noted at this time). EXAM: CT HEAD WITHOUT CONTRAST CT MAXILLOFACIAL WITHOUT CONTRAST TECHNIQUE: Multidetector CT imaging of the head and maxillofacial structures were performed using the standard protocol without intravenous contrast. Multiplanar CT image reconstructions of the maxillofacial structures were also generated. COMPARISON:  None. FINDINGS: CT HEAD FINDINGS The ventricles are normal in size and configuration. There are no parenchymal masses or mass effect. There are bilateral basal ganglia calcifications. Small calcifications in noted in the right frontal lobe. There is no evidence of an infarct. There are no extra-axial masses or abnormal fluid collections. No intracranial hemorrhage. No skull fracture. CT MAXILLOFACIAL FINDINGS No fractures. Normal globes and orbits. Sinuses, mastoid air cells and middle ear cavities are clear. No soft tissue masses or adenopathy. IMPRESSION: HEAD CT:  No acute intracranial abnormality.  No skull fracture. MAXILLOFACIAL CT:  No fracture or acute finding. Electronically  Signed   By: Lajean Manes M.D.   On: 08/17/2015 14:57   Dg Chest Port 1 View  08/16/2015  CLINICAL DATA:  Shortness of breath, chest tightness and coughing after dialysis. EXAM: PORTABLE CHEST 1 VIEW COMPARISON:  08/12/2015 FINDINGS: Again noted is a left sided dialysis catheter. The tip of the catheter is at the superior cavoatrial junction. The catheter extends through a vascular stent in the left innominate vein and SVC. The lungs are clear without airspace disease or pulmonary edema. Again noted is focal lucency at the left lung base which similar to the previous examination. Heart size is within limits and stable. The trachea is midline. IMPRESSION: No acute chest findings. Prominent lucency at the left lung base is similar to the prior examination. A deep sulcus sign and anterior pneumothorax is thought to be unlikely. If there is clinical concern for an occult left pneumothorax with symptoms in the left chest, consider further evaluation with decubitus images. Stable position of the dialysis catheter. Electronically Signed   By: Markus Daft M.D.   On: 08/16/2015 11:07   Ct Maxillofacial Wo Cm  08/17/2015  CLINICAL DATA:  Pt's mother reports pt was extubated this past Sunday and discharge from hospital this past Wednesday. Last dialysis treatment was yesterday but they were unable to pull off fluid at that time d/t hypotension. Pt was leaving home today around 1300 to go to dialysis when she fell in the driveway. Reports hitting her head (denies LOC, no injury noted at this time). EXAM: CT HEAD WITHOUT CONTRAST CT MAXILLOFACIAL WITHOUT CONTRAST TECHNIQUE: Multidetector CT imaging of the head and maxillofacial structures were performed using the standard protocol without intravenous contrast. Multiplanar CT image reconstructions of the maxillofacial structures were also generated. COMPARISON:  None. FINDINGS: CT HEAD FINDINGS The ventricles are normal in size and configuration. There are no parenchymal  masses or mass effect. There are bilateral basal ganglia calcifications. Small calcifications in noted in the right frontal lobe. There is no evidence of an infarct. There are no extra-axial masses or abnormal fluid collections. No intracranial hemorrhage. No skull fracture. CT MAXILLOFACIAL FINDINGS No fractures. Normal globes and orbits. Sinuses, mastoid air cells and middle ear cavities are clear. No soft tissue masses or adenopathy. IMPRESSION: HEAD CT:  No acute intracranial abnormality.  No skull fracture. MAXILLOFACIAL CT:  No fracture or acute finding. Electronically Signed   By:  Lajean Manes M.D.   On: 08/17/2015 14:57   I have personally reviewed and evaluated these images and lab results as part of my medical decision-making.   EKG Interpretation   Date/Time:  Saturday August 17 2015 13:27:39 EST Ventricular Rate:  109 PR Interval:  138 QRS Duration: 96 QT Interval:  371 QTC Calculation: 500 R Axis:   69 Text Interpretation:  Sinus tachycardia Borderline repolarization  abnormality Prolonged QT interval No significant change since last tracing  Confirmed by Chriselda Leppert  MD, [place] [id number]) on 08/17/2015 1:30:41 PM      MDM   Final diagnoses:  Fall, initial encounter  Head injury, initial encounter  Contusion of face, initial encounter  ESRD on dialysis [facility])    Patient is a dialysis patient. Patient was last dialyzed yesterday. They did not complete dialysis cause her pressures got low so the plan was that she was to return to dialysis today and complete dialysis. On the way there patient fell did not have a syncopal episode and landed on the right side of her face and head. Patient has been feeling short of breath. However here oxygen saturation's on room air 97%. Chest x-rays negative for any evidence of fluid on the lungs. Patient's basic labs showed no urgent need for dialysis troponins 2.9. Patient's CT of head and face is negative so no evidence of any acute injury from  the fall. Patient stable for discharge home and follow-up with dialysis.    Fredia Sorrow, MD 08/17/15 [phone]

## 2015-08-17 NOTE — ED Notes (Signed)
Pt's mother reports pt was extubated this past Sunday and discharge from hospital this past Wednesday. Last dialysis treatment was yesterday but they were unable to pull off fluid at that time d/t hypotension. Pt was leaving home today around 1300 to go to dialysis when she fell in the driveway. Reports hitting her head (denies LOC, no injury noted at this time).

## 2015-08-20 LAB — BENZODIAZEPINES,MS,WB/SP RFX
7-AMINOCLONAZEPAM: NEGATIVE ng/mL
Alprazolam: NEGATIVE ng/mL
Benzodiazepines Confirm: NEGATIVE
CHLORDIAZEPOXIDE: NEGATIVE ng/mL
CLONAZEPAM: NEGATIVE ng/mL
DESALKYLFLURAZEPAM: NEGATIVE ng/mL
DIAZEPAM: NEGATIVE ng/mL
Desmethylchlordiazepoxide: NEGATIVE ng/mL
Desmethyldiazepam: NEGATIVE ng/mL
FLURAZEPAM: NEGATIVE ng/mL
Lorazepam: NEGATIVE ng/mL
MIDAZOLAM: NEGATIVE ng/mL
Oxazepam: NEGATIVE ng/mL
TEMAZEPAM: NEGATIVE ng/mL
Triazolam: NEGATIVE ng/mL

## 2015-08-20 LAB — DRUG SCREEN 10 W/CONF, SERUM
AMPHETAMINES, IA: NEGATIVE ng/mL
Barbiturates, IA: NEGATIVE ug/mL
Benzodiazepines, IA: NEGATIVE ng/mL
COCAINE & METABOLITE, IA: NEGATIVE ng/mL
Methadone, IA: NEGATIVE ng/mL
OPIATES, IA: NEGATIVE ng/mL
OXYCODONES, IA: NEGATIVE ng/mL
PROPOXYPHENE, IA: NEGATIVE ng/mL
Phencyclidine, IA: NEGATIVE ng/mL
THC(Marijuana) Metabolite, IA: NEGATIVE ng/mL

## 2015-08-27 ENCOUNTER — Emergency Department (HOSPITAL_BASED_OUTPATIENT_CLINIC_OR_DEPARTMENT_OTHER)
Admission: EM | Admit: 2015-08-27 | Discharge: 2015-08-27 | Disposition: A | Discharge status: Home / Self Care | Payer: Medicare Other | Attending: Physician Assistant | Admitting: Physician Assistant

## 2015-08-27 ENCOUNTER — Encounter (HOSPITAL_BASED_OUTPATIENT_CLINIC_OR_DEPARTMENT_OTHER): Payer: Self-pay | Admitting: *Deleted

## 2015-08-27 ENCOUNTER — Emergency Department (HOSPITAL_BASED_OUTPATIENT_CLINIC_OR_DEPARTMENT_OTHER): Payer: Medicare Other

## 2015-08-27 DIAGNOSIS — Z91040 Latex allergy status: Secondary | ICD-10-CM | POA: Insufficient documentation

## 2015-08-27 DIAGNOSIS — Z7901 Long term (current) use of anticoagulants: Secondary | ICD-10-CM | POA: Insufficient documentation

## 2015-08-27 DIAGNOSIS — E785 Hyperlipidemia, unspecified: Secondary | ICD-10-CM | POA: Diagnosis not present

## 2015-08-27 DIAGNOSIS — Z3202 Encounter for pregnancy test, result negative: Secondary | ICD-10-CM | POA: Diagnosis not present

## 2015-08-27 DIAGNOSIS — N186 End stage renal disease: Secondary | ICD-10-CM | POA: Insufficient documentation

## 2015-08-27 DIAGNOSIS — R110 Nausea: Secondary | ICD-10-CM | POA: Insufficient documentation

## 2015-08-27 DIAGNOSIS — N760 Acute vaginitis: Secondary | ICD-10-CM | POA: Diagnosis not present

## 2015-08-27 DIAGNOSIS — D649 Anemia, unspecified: Secondary | ICD-10-CM | POA: Diagnosis not present

## 2015-08-27 DIAGNOSIS — B9689 Other specified bacterial agents as the cause of diseases classified elsewhere: Secondary | ICD-10-CM

## 2015-08-27 DIAGNOSIS — Z940 Kidney transplant status: Secondary | ICD-10-CM | POA: Diagnosis not present

## 2015-08-27 DIAGNOSIS — Z86711 Personal history of pulmonary embolism: Secondary | ICD-10-CM | POA: Insufficient documentation

## 2015-08-27 DIAGNOSIS — Z992 Dependence on renal dialysis: Secondary | ICD-10-CM | POA: Insufficient documentation

## 2015-08-27 DIAGNOSIS — Z79899 Other long term (current) drug therapy: Secondary | ICD-10-CM | POA: Diagnosis not present

## 2015-08-27 DIAGNOSIS — I120 Hypertensive chronic kidney disease with stage 5 chronic kidney disease or end stage renal disease: Secondary | ICD-10-CM | POA: Diagnosis not present

## 2015-08-27 DIAGNOSIS — R1030 Lower abdominal pain, unspecified: Secondary | ICD-10-CM | POA: Diagnosis present

## 2015-08-27 DIAGNOSIS — R1032 Left lower quadrant pain: Secondary | ICD-10-CM

## 2015-08-27 DIAGNOSIS — K219 Gastro-esophageal reflux disease without esophagitis: Secondary | ICD-10-CM | POA: Diagnosis not present

## 2015-08-27 LAB — WET PREP, GENITAL
SPERM: NONE SEEN
TRICH WET PREP: NONE SEEN
YEAST WET PREP: NONE SEEN

## 2015-08-27 LAB — HCG, SERUM, QUALITATIVE: Preg, Serum: NEGATIVE

## 2015-08-27 MED ORDER — ONDANSETRON 4 MG PO TBDP
4.0000 mg | ORAL_TABLET | Freq: Once | ORAL | Status: AC
  Administered 2015-08-27: 4 mg via ORAL

## 2015-08-27 MED ORDER — METRONIDAZOLE 0.75 % VA GEL: 1.0000 | Freq: Two times a day (BID) | VAGINAL | Status: DC

## 2015-08-27 MED ORDER — HYDROMORPHONE HCL 1 MG/ML IJ SOLN
2.0000 mg | Freq: Once | INTRAMUSCULAR | Status: AC
  Administered 2015-08-27: 2 mg via INTRAMUSCULAR
  Filled 2015-08-27: qty 2

## 2015-08-27 MED ORDER — ONDANSETRON 4 MG PO TBDP
ORAL_TABLET | ORAL | Status: AC
  Filled 2015-08-27: qty 1

## 2015-08-27 NOTE — Discharge Instructions (Signed)

## 2015-08-27 NOTE — ED Notes (Signed)
Left lower quadrant pain x 3 days.

## 2015-08-27 NOTE — ED Notes (Signed)
Pa  at bedside. 

## 2015-08-27 NOTE — ED Provider Notes (Signed)
CSN: [id number]     Arrival date & time 08/27/15  1342 History   First MD Initiated Contact with Patient 08/27/15 1404     Chief Complaint  Patient presents with  . Abdominal Pain     (Consider location/radiation/quality/duration/timing/severity/associated sxs/prior Treatment) Patient is a 25 y.o. female presenting with abdominal pain. The history is provided by the patient. No language interpreter was used.  Abdominal Pain Pain location:  Suprapubic Pain quality: aching   Pain radiates to:  Does not radiate Pain severity:  Moderate Onset quality:  Gradual Timing:  Constant Progression:  Worsening Chronicity:  New Relieved by:  Nothing Worsened by:  Nothing tried Ineffective treatments:  None tried Associated symptoms: nausea   Pt complains of lower abdominal pain.  Pt thinks she may have an ovarian cyst  Past Medical History  Diagnosis Date  . Anemia   . ESRD (end stage renal disease) ([place]) Feb. 12, 2010    First date of Dialysis   . Secondary hyperparathyroidism ([place])   . Pulmonary emboli ([place])   . Hypertension   . GERD (gastroesophageal reflux disease)   . Hyperlipidemia   . Focal segmental glomerulosclerosis   . Dialysis patient [facility])    Past Surgical History  Procedure Laterality Date  . Kidney transplant     No family history on file. Social History  Substance Use Topics  . Smoking status: Never Smoker   . Smokeless tobacco: Never Used  . Alcohol Use: No   OB History    No data available     Review of Systems  Gastrointestinal: Positive for nausea and abdominal pain.  All other systems reviewed and are negative.     Allergies  Ferrlecit; Cefazolin; Cyclosporine; Lactose intolerance (gi); Latex; and Vancomycin  Home Medications   Prior to Admission medications   Medication Sig Start Date End Date Taking? Authorizing Provider  benzonatate (TESSALON) 100 MG capsule Take 1 capsule (100 mg total) by mouth every 8 (eight) hours. 08/16/15   Vira Blanco, MD  calcium acetate (PHOSLO) 667 MG capsule Take 1,334 mg by mouth 3 (three) times daily with meals.     Historical Provider, MD  carvedilol (COREG) 25 MG tablet Take 25 mg by mouth 2 (two) times daily with a meal.    Historical Provider, MD  cloNIDine HCl (KAPVAY) 0.1 MG TB12 ER tablet Take 0.1 mg by mouth 2 (two) times daily.     Historical Provider, MD  diphenhydrAMINE (BENADRYL) 25 mg capsule Take 1 capsule (25 mg total) by mouth every 6 (six) hours as needed for allergies. 08/14/15   Shanker Kristeen Mans, MD  EPINEPHrine 0.3 mg/0.3 mL IJ SOAJ injection Inject 0.3 mLs (0.3 mg total) into the muscle once. 08/16/15   Vira Blanco, MD  folic acid (FOLVITE) 1 MG tablet Take 1 mg by mouth daily.    Historical Provider, MD  multivitamin (RENA-VIT) TABS tablet Take 1 tablet by mouth daily. 02/12/15   Historical Provider, MD  pantoprazole (PROTONIX) 40 MG tablet Take 1 tablet (40 mg total) by mouth 2 (two) times daily before a meal. 08/15/15   Jonetta Osgood, MD  predniSONE (DELTASONE) 10 MG tablet Take 1 tablet (10 mg total) by mouth daily with breakfast. For 2 days and then stop 08/16/15   Jonetta Osgood, MD  ranitidine (ZANTAC) 300 MG capsule Take 300 mg by mouth every evening.    Historical Provider, MD  rosuvastatin (CRESTOR) 10 MG tablet Take 10 mg by mouth daily.  Historical Provider, MD  warfarin (COUMADIN) 1 MG tablet Take 2 mg by mouth daily after supper.     Historical Provider, MD  warfarin (COUMADIN) 5 MG tablet Take 5 mg by mouth daily after supper.     Historical Provider, MD   BP 152/116 mmHg  Pulse 88  Temp(Src) 99 F (37.2 C) (Oral)  Resp 20  SpO2 95%  LMP 06/15/2015 Physical Exam  Constitutional: She appears well-developed and well-nourished.  HENT:  Head: Normocephalic and atraumatic.  Eyes: Conjunctivae are normal. Pupils are equal, round, and reactive to light.  Neck: Normal range of motion. Neck supple.  Cardiovascular: Normal rate.   Pulmonary/Chest: Effort  normal.  Abdominal: Soft. There is no tenderness.  Genitourinary: Vaginal discharge found.  Vaginal discharge,  Thick white,  Pt unable to tolerate vaginal exam  Musculoskeletal: Normal range of motion.  Skin: Skin is warm.    ED Course  Procedures (including critical care time) Labs Review Labs Reviewed  WET PREP, GENITAL - Abnormal; Notable for the following:    Clue Cells Wet Prep HPF POC PRESENT (*)    WBC, Wet Prep HPF POC MANY (*)    All other components within normal limits  GC/CHLAMYDIA PROBE AMP ([place]) NOT AT [facility]    Imaging Review US Transvaginal Non-ob  08/27/2015  CLINICAL DATA:  Three-day history of pelvic pain EXAM: TRANSABDOMINAL AND TRANSVAGINAL ULTRASOUND OF PELVIS TECHNIQUE: Study was performed transabdominally to optimize pelvic field of view evaluation and transvaginally to optimize internal visceral architecture evaluation. COMPARISON:  None FINDINGS: Uterus Measurements: 6.7 x 2.9 x 3.5 cm. No fibroids or other mass visualized. There are calcifications in the periphery of the uterus, likely of vascular etiology. Endometrium Thickness: 5 mm near the cervix. 3 mm more distally. No focal abnormality visualized. Right ovary Unable to visualize by transabdominal or transvaginal technique. No right-sided pelvic mass apparent. Left ovary Unable to visualize by transabdominal or transvaginal technique. No left-sided pelvic mass apparent. Other findings Trace free fluid. IMPRESSION: Neither ovary could be visualized due to overlying gas. No pelvic masses were apparent by ultrasound. Trace free fluid may be physiologic. Vascular appearing calcification is noted in the periphery of the uterus. No intrauterine mass is appreciable on this study. Electronically Signed   By: Lowella Grip III M.D.   On: 08/27/2015 16:00   US Pelvis Complete  08/27/2015  CLINICAL DATA:  Three-day history of pelvic pain EXAM: TRANSABDOMINAL AND TRANSVAGINAL ULTRASOUND OF PELVIS TECHNIQUE:  Study was performed transabdominally to optimize pelvic field of view evaluation and transvaginally to optimize internal visceral architecture evaluation. COMPARISON:  None FINDINGS: Uterus Measurements: 6.7 x 2.9 x 3.5 cm. No fibroids or other mass visualized. There are calcifications in the periphery of the uterus, likely of vascular etiology. Endometrium Thickness: 5 mm near the cervix. 3 mm more distally. No focal abnormality visualized. Right ovary Unable to visualize by transabdominal or transvaginal technique. No right-sided pelvic mass apparent. Left ovary Unable to visualize by transabdominal or transvaginal technique. No left-sided pelvic mass apparent. Other findings Trace free fluid. IMPRESSION: Neither ovary could be visualized due to overlying gas. No pelvic masses were apparent by ultrasound. Trace free fluid may be physiologic. Vascular appearing calcification is noted in the periphery of the uterus. No intrauterine mass is appreciable on this study. Electronically Signed   By: Lowella Grip III M.D.   On: 08/27/2015 16:00   I have personally reviewed and evaluated these images and lab results as part of my medical  decision-making.   EKG Interpretation None      MDM Pt denies any std risk.  Wet prep shows clues,  Given discmfort I will treat for Bv with metrogel vaginal.     Final diagnoses:  LLQ pain    Meds ordered this encounter  Medications  . HYDROmorphone (DILAUDID) injection 2 mg    Sig:   . ondansetron (ZOFRAN-ODT) disintegrating tablet 4 mg    Sig:   . ondansetron (ZOFRAN-ODT) 4 MG disintegrating tablet    Sig:     Woodward Ku   : cabinet override  . metroNIDAZOLE (METROGEL VAGINAL) 0.75 % vaginal gel    Sig: Place 1 Applicatorful vaginally 2 (two) times daily.    Dispense:  70 g    Refill:  0    Order Specific Question:  Supervising Provider    Answer:  Noemi Chapel [place], PA-C 08/27/15 [place],  MD 09/19/15 [phone]

## 2015-08-27 NOTE — ED Notes (Signed)
Pt placed on auto vitals Q30.  

## 2015-08-27 NOTE — ED Notes (Signed)
Right Radial Artery x 1 attempt for Lab work. Bleeding stopped. No complications noted.

## 2015-08-28 LAB — GC/CHLAMYDIA PROBE AMP (~~LOC~~) NOT AT ARMC
CHLAMYDIA, DNA PROBE: NEGATIVE
NEISSERIA GONORRHEA: NEGATIVE

## 2016-11-23 ENCOUNTER — Emergency Department (HOSPITAL_BASED_OUTPATIENT_CLINIC_OR_DEPARTMENT_OTHER): Payer: Medicare Other

## 2016-11-23 ENCOUNTER — Encounter (HOSPITAL_BASED_OUTPATIENT_CLINIC_OR_DEPARTMENT_OTHER): Payer: Self-pay | Admitting: *Deleted

## 2016-11-23 ENCOUNTER — Inpatient Hospital Stay (HOSPITAL_BASED_OUTPATIENT_CLINIC_OR_DEPARTMENT_OTHER)
Admission: EM | Admit: 2016-11-23 | Discharge: 2016-11-28 | DRG: 871 | Disposition: A | Discharge status: Home / Self Care | Payer: Medicare Other | Attending: Internal Medicine | Admitting: Internal Medicine

## 2016-11-23 DIAGNOSIS — R6510 Systemic inflammatory response syndrome (SIRS) of non-infectious origin without acute organ dysfunction: Secondary | ICD-10-CM

## 2016-11-23 DIAGNOSIS — D72819 Decreased white blood cell count, unspecified: Secondary | ICD-10-CM | POA: Diagnosis present

## 2016-11-23 DIAGNOSIS — A419 Sepsis, unspecified organism: Secondary | ICD-10-CM | POA: Diagnosis present

## 2016-11-23 DIAGNOSIS — J069 Acute upper respiratory infection, unspecified: Secondary | ICD-10-CM | POA: Diagnosis not present

## 2016-11-23 DIAGNOSIS — D696 Thrombocytopenia, unspecified: Secondary | ICD-10-CM | POA: Diagnosis present

## 2016-11-23 DIAGNOSIS — R609 Edema, unspecified: Secondary | ICD-10-CM | POA: Diagnosis present

## 2016-11-23 DIAGNOSIS — Z79899 Other long term (current) drug therapy: Secondary | ICD-10-CM

## 2016-11-23 DIAGNOSIS — D631 Anemia in chronic kidney disease: Secondary | ICD-10-CM | POA: Diagnosis present

## 2016-11-23 DIAGNOSIS — E8889 Other specified metabolic disorders: Secondary | ICD-10-CM | POA: Diagnosis present

## 2016-11-23 DIAGNOSIS — K219 Gastro-esophageal reflux disease without esophagitis: Secondary | ICD-10-CM | POA: Diagnosis present

## 2016-11-23 DIAGNOSIS — I10 Essential (primary) hypertension: Secondary | ICD-10-CM | POA: Diagnosis present

## 2016-11-23 DIAGNOSIS — E739 Lactose intolerance, unspecified: Secondary | ICD-10-CM | POA: Diagnosis present

## 2016-11-23 DIAGNOSIS — R509 Fever, unspecified: Secondary | ICD-10-CM | POA: Diagnosis present

## 2016-11-23 DIAGNOSIS — N186 End stage renal disease: Secondary | ICD-10-CM | POA: Diagnosis not present

## 2016-11-23 DIAGNOSIS — Z7901 Long term (current) use of anticoagulants: Secondary | ICD-10-CM

## 2016-11-23 DIAGNOSIS — J189 Pneumonia, unspecified organism: Secondary | ICD-10-CM | POA: Diagnosis present

## 2016-11-23 DIAGNOSIS — R110 Nausea: Secondary | ICD-10-CM | POA: Diagnosis not present

## 2016-11-23 DIAGNOSIS — Z91040 Latex allergy status: Secondary | ICD-10-CM

## 2016-11-23 DIAGNOSIS — R079 Chest pain, unspecified: Secondary | ICD-10-CM

## 2016-11-23 DIAGNOSIS — Z992 Dependence on renal dialysis: Secondary | ICD-10-CM | POA: Diagnosis not present

## 2016-11-23 DIAGNOSIS — Z86718 Personal history of other venous thrombosis and embolism: Secondary | ICD-10-CM

## 2016-11-23 DIAGNOSIS — Z86711 Personal history of pulmonary embolism: Secondary | ICD-10-CM

## 2016-11-23 DIAGNOSIS — N2581 Secondary hyperparathyroidism of renal origin: Secondary | ICD-10-CM | POA: Diagnosis present

## 2016-11-23 DIAGNOSIS — Z940 Kidney transplant status: Secondary | ICD-10-CM

## 2016-11-23 DIAGNOSIS — Z888 Allergy status to other drugs, medicaments and biological substances status: Secondary | ICD-10-CM

## 2016-11-23 HISTORY — DX: Systemic inflammatory response syndrome (sirs) of non-infectious origin without acute organ dysfunction: R65.10

## 2016-11-23 HISTORY — DX: End stage renal disease: N18.6

## 2016-11-23 HISTORY — DX: Dependence on renal dialysis: Z99.2

## 2016-11-23 LAB — CBC WITH DIFFERENTIAL/PLATELET
BASOS ABS: 0 10*3/uL (ref 0.0–0.1)
BASOS PCT: 0 %
EOS ABS: 0.1 10*3/uL (ref 0.0–0.7)
EOS PCT: 2 %
HCT: 31.3 % — ABNORMAL LOW (ref 36.0–46.0)
HEMOGLOBIN: 10 g/dL — AB (ref 12.0–15.0)
Lymphocytes Relative: 11 %
Lymphs Abs: 0.3 10*3/uL — ABNORMAL LOW (ref 0.7–4.0)
MCH: 31.3 pg (ref 26.0–34.0)
MCHC: 31.9 g/dL (ref 30.0–36.0)
MCV: 97.8 fL (ref 78.0–100.0)
Monocytes Absolute: 0.3 10*3/uL (ref 0.1–1.0)
Monocytes Relative: 9 %
NEUTROS PCT: 78 %
Neutro Abs: 2.3 10*3/uL (ref 1.7–7.7)
RBC: 3.2 MIL/uL — AB (ref 3.87–5.11)
RDW: 16.6 % — ABNORMAL HIGH (ref 11.5–15.5)
WBC: 3 10*3/uL — AB (ref 4.0–10.5)

## 2016-11-23 LAB — COMPREHENSIVE METABOLIC PANEL
ALK PHOS: 65 U/L (ref 38–126)
ALT: 28 U/L (ref 14–54)
AST: 52 U/L — ABNORMAL HIGH (ref 15–41)
Albumin: 4.6 g/dL (ref 3.5–5.0)
Anion gap: 15 (ref 5–15)
BILIRUBIN TOTAL: 0.8 mg/dL (ref 0.3–1.2)
BUN: 24 mg/dL — ABNORMAL HIGH (ref 6–20)
CALCIUM: 9.6 mg/dL (ref 8.9–10.3)
CHLORIDE: 96 mmol/L — AB (ref 101–111)
CO2: 24 mmol/L (ref 22–32)
CREATININE: 6.97 mg/dL — AB (ref 0.44–1.00)
GFR, EST AFRICAN AMERICAN: 8 mL/min — AB (ref >60)
GFR, EST NON AFRICAN AMERICAN: 7 mL/min — AB (ref >60)
Glucose, Bld: 72 mg/dL (ref 65–99)
Potassium: 4.2 mmol/L (ref 3.5–5.1)
Sodium: 135 mmol/L (ref 135–145)
TOTAL PROTEIN: 8.3 g/dL — AB (ref 6.5–8.1)

## 2016-11-23 LAB — PROCALCITONIN: Procalcitonin: 1.72 ng/mL

## 2016-11-23 LAB — RAPID STREP SCREEN (MED CTR MEBANE ONLY): Streptococcus, Group A Screen (Direct): NEGATIVE

## 2016-11-23 LAB — I-STAT CG4 LACTIC ACID, ED
LACTIC ACID, VENOUS: 2.05 mmol/L — AB (ref 0.5–1.9)
LACTIC ACID, VENOUS: 0.81 mmol/L (ref 0.5–1.9)

## 2016-11-23 LAB — PROTIME-INR
INR: 3.11
Prothrombin Time: 32.7 seconds — ABNORMAL HIGH (ref 11.4–15.2)

## 2016-11-23 LAB — HCG, SERUM, QUALITATIVE: Preg, Serum: NEGATIVE

## 2016-11-23 LAB — LACTIC ACID, PLASMA: LACTIC ACID, VENOUS: 0.7 mmol/L (ref 0.5–1.9)

## 2016-11-23 LAB — TROPONIN I

## 2016-11-23 MED ORDER — FAMOTIDINE 20 MG PO TABS
20.0000 mg | ORAL_TABLET | Freq: Every day | ORAL | Status: DC
  Administered 2016-11-23 – 2016-11-28 (×6): 20 mg via ORAL
  Filled 2016-11-23 (×6): qty 1

## 2016-11-23 MED ORDER — RENA-VITE PO TABS
1.0000 | ORAL_TABLET | Freq: Every day | ORAL | Status: DC
  Administered 2016-11-23 – 2016-11-27 (×5): 1 via ORAL
  Filled 2016-11-23 (×5): qty 1

## 2016-11-23 MED ORDER — RENA-VITE PO TABS: 1.0000 | ORAL_TABLET | Freq: Every day | ORAL | Status: DC

## 2016-11-23 MED ORDER — ONDANSETRON HCL 4 MG PO TABS: 4.0000 mg | ORAL_TABLET | Freq: Four times a day (QID) | ORAL | Status: DC | PRN

## 2016-11-23 MED ORDER — HYDRALAZINE HCL 20 MG/ML IJ SOLN: 10.0000 mg | INTRAMUSCULAR | Status: DC | PRN

## 2016-11-23 MED ORDER — ROSUVASTATIN CALCIUM 10 MG PO TABS
10.0000 mg | ORAL_TABLET | Freq: Every day | ORAL | Status: DC
  Administered 2016-11-24 – 2016-11-27 (×4): 10 mg via ORAL
  Filled 2016-11-23 (×5): qty 1

## 2016-11-23 MED ORDER — BENZONATATE 100 MG PO CAPS: 100.0000 mg | ORAL_CAPSULE | Freq: Three times a day (TID) | ORAL | Status: DC

## 2016-11-23 MED ORDER — WARFARIN - PHARMACIST DOSING INPATIENT
Freq: Every day | Status: DC
  Administered 2016-11-27: 18:00:00

## 2016-11-23 MED ORDER — LEVOFLOXACIN IN D5W 500 MG/100ML IV SOLN
500.0000 mg | INTRAVENOUS | Status: DC
  Administered 2016-11-25: 500 mg via INTRAVENOUS
  Filled 2016-11-23: qty 100

## 2016-11-23 MED ORDER — CARVEDILOL 25 MG PO TABS: 25.0000 mg | ORAL_TABLET | Freq: Two times a day (BID) | ORAL | Status: DC

## 2016-11-23 MED ORDER — DEXTROSE 5 % IV SOLN
500.0000 mg | Freq: Two times a day (BID) | INTRAVENOUS | Status: DC
  Administered 2016-11-23 – 2016-11-24 (×2): 500 mg via INTRAVENOUS
  Filled 2016-11-23 (×3): qty 0.5

## 2016-11-23 MED ORDER — FENTANYL CITRATE (PF) 100 MCG/2ML IJ SOLN
12.5000 ug | INTRAMUSCULAR | Status: DC | PRN
  Administered 2016-11-23 – 2016-11-28 (×27): 12.5 ug via INTRAVENOUS
  Filled 2016-11-23 (×24): qty 2

## 2016-11-23 MED ORDER — IOPAMIDOL (ISOVUE-370) INJECTION 76%
INTRAVENOUS | Status: AC
  Administered 2016-11-24: 100 mL
  Filled 2016-11-23: qty 100

## 2016-11-23 MED ORDER — MORPHINE SULFATE (PF) 4 MG/ML IV SOLN
2.0000 mg | Freq: Once | INTRAVENOUS | Status: AC
  Administered 2016-11-23: 2 mg via INTRAVENOUS
  Filled 2016-11-23: qty 1

## 2016-11-23 MED ORDER — AZTREONAM 1 G IJ SOLR
1.0000 g | Freq: Once | INTRAMUSCULAR | Status: AC
  Administered 2016-11-23: 1 g via INTRAVENOUS

## 2016-11-23 MED ORDER — LINEZOLID 600 MG/300ML IV SOLN
600.0000 mg | Freq: Once | INTRAVENOUS | Status: AC
  Administered 2016-11-23: 600 mg via INTRAVENOUS
  Filled 2016-11-23: qty 300

## 2016-11-23 MED ORDER — ONDANSETRON HCL 4 MG/2ML IJ SOLN
4.0000 mg | Freq: Four times a day (QID) | INTRAMUSCULAR | Status: DC | PRN
  Administered 2016-11-25 – 2016-11-27 (×3): 4 mg via INTRAVENOUS
  Filled 2016-11-23 (×3): qty 2

## 2016-11-23 MED ORDER — FENTANYL CITRATE (PF) 100 MCG/2ML IJ SOLN
50.0000 ug | Freq: Once | INTRAMUSCULAR | Status: AC
  Administered 2016-11-23: 50 ug via INTRAVENOUS
  Filled 2016-11-23: qty 2

## 2016-11-23 MED ORDER — ACETAMINOPHEN 650 MG RE SUPP: 650.0000 mg | Freq: Four times a day (QID) | RECTAL | Status: DC | PRN

## 2016-11-23 MED ORDER — LEVOFLOXACIN IN D5W 750 MG/150ML IV SOLN
750.0000 mg | Freq: Once | INTRAVENOUS | Status: AC
  Administered 2016-11-23: 750 mg via INTRAVENOUS
  Filled 2016-11-23: qty 150

## 2016-11-23 MED ORDER — AZTREONAM 1 G IJ SOLR
INTRAMUSCULAR | Status: AC
  Filled 2016-11-23: qty 1

## 2016-11-23 MED ORDER — CLONIDINE HCL ER 0.1 MG PO TB12: 0.1000 mg | ORAL_TABLET | Freq: Two times a day (BID) | ORAL | Status: DC

## 2016-11-23 MED ORDER — HYDRALAZINE HCL 25 MG PO TABS
25.0000 mg | ORAL_TABLET | Freq: Three times a day (TID) | ORAL | Status: DC
  Administered 2016-11-24 – 2016-11-26 (×5): 25 mg via ORAL
  Filled 2016-11-23 (×8): qty 1

## 2016-11-23 MED ORDER — PANTOPRAZOLE SODIUM 40 MG PO TBEC: 40.0000 mg | DELAYED_RELEASE_TABLET | Freq: Two times a day (BID) | ORAL | Status: DC

## 2016-11-23 MED ORDER — ACETAMINOPHEN 325 MG PO TABS
650.0000 mg | ORAL_TABLET | Freq: Four times a day (QID) | ORAL | Status: DC | PRN
  Administered 2016-11-27: 650 mg via ORAL
  Filled 2016-11-23: qty 2

## 2016-11-23 MED ORDER — AMLODIPINE BESYLATE 10 MG PO TABS
10.0000 mg | ORAL_TABLET | Freq: Every day | ORAL | Status: DC
  Administered 2016-11-24 – 2016-11-27 (×3): 10 mg via ORAL
  Filled 2016-11-23 (×4): qty 1

## 2016-11-23 MED ORDER — ALBUTEROL SULFATE (2.5 MG/3ML) 0.083% IN NEBU
2.5000 mg | INHALATION_SOLUTION | Freq: Once | RESPIRATORY_TRACT | Status: AC
  Administered 2016-11-23: 2.5 mg via RESPIRATORY_TRACT
  Filled 2016-11-23: qty 3

## 2016-11-23 MED ORDER — CALCIUM ACETATE (PHOS BINDER) 667 MG PO CAPS
1334.0000 mg | ORAL_CAPSULE | Freq: Three times a day (TID) | ORAL | Status: DC
  Administered 2016-11-24 – 2016-11-28 (×8): 1334 mg via ORAL
  Filled 2016-11-23 (×16): qty 2

## 2016-11-23 MED ORDER — FENTANYL CITRATE (PF) 100 MCG/2ML IJ SOLN
25.0000 ug | Freq: Once | INTRAMUSCULAR | Status: AC
  Administered 2016-11-23: 25 ug via INTRAVENOUS
  Filled 2016-11-23: qty 2

## 2016-11-23 MED ORDER — ACETAMINOPHEN 500 MG PO TABS
1000.0000 mg | ORAL_TABLET | Freq: Once | ORAL | Status: AC
  Administered 2016-11-23: 1000 mg via ORAL
  Filled 2016-11-23: qty 2

## 2016-11-23 MED ORDER — SODIUM CHLORIDE 0.9 % IV BOLUS (SEPSIS): 500.0000 mL | Freq: Once | INTRAVENOUS | Status: DC

## 2016-11-23 MED ORDER — WARFARIN SODIUM 4 MG PO TABS
4.0000 mg | ORAL_TABLET | Freq: Once | ORAL | Status: AC
  Administered 2016-11-23: 4 mg via ORAL
  Filled 2016-11-23: qty 1

## 2016-11-23 NOTE — Progress Notes (Signed)
ANTICOAGULATION CONSULT NOTE - Initial Consult  Pharmacy Consult for Warfarin Indication: Hx PE  Allergies  Allergen Reactions  . Ferrlecit [Na Ferric Gluc Cplx In Sucrose] Anaphylaxis    Anaphylactic shock/severe angioedema on 08/09/15 related to IV iron Ferrlecit IV)  required intubation for respiratory failure.  . Cefazolin   . Cyclosporine     Loss of motor skills  . Lactose Intolerance (Gi)   . Latex   . Vancomycin Rash    Patient Measurements: Weight: 108 lb (49 kg)   Vital Signs: Temp: 100.7 F (38.2 C) (05/28 1901) Temp Source: Oral (05/28 1901) BP: 150/109 (05/28 1901) Pulse Rate: 89 (05/28 1901)  Labs:  Recent Labs  11/23/16 1052 11/23/16 1146  HGB  --  10.0*  HCT  --  31.3*  PLT  --  REPEATED TO VERIFY  LABPROT  --  32.7*  INR  --  3.11  CREATININE 6.97*  --     CrCl cannot be calculated (Unknown ideal weight.).   Medical History: Past Medical History:  Diagnosis Date  . Anemia   . Dialysis patient ([place])   . ESRD (end stage renal disease) ([place]) Feb. 12, 2010   First date of Dialysis   . Focal segmental glomerulosclerosis   . GERD (gastroesophageal reflux disease)   . Hyperlipidemia   . Hypertension   . Pulmonary emboli ([place])   . Secondary hyperparathyroidism ([place])     Assessment: 79 YOF who presented on 5/28 with fever, cough, congestion, and sore throat. The patient was on warfarin PTA for hx PE/DVT. Admit INR 3.11 on PTA dose of 8.5 mg daily per patient report. The patient's last reported dose was on 5/27.  Goal of Therapy:  INR 2-3 Monitor platelets by anticoagulation protocol: Yes   Plan:  1. Warfarin 4 mg x 1 dose at 1800 today 2. Daily PT/INR 3. Will continue to monitor for any signs/symptoms of bleeding and will follow up with PT/INR in the a.m.   Thank you for allowing pharmacy to be a part of this patient's care.  Alycia Rossetti, PharmD, BCPS Clinical Pharmacist 11/23/2016 8:31 PM

## 2016-11-23 NOTE — ED Notes (Signed)
Pt difficult IV stick.  Unable to obtain blood cultures prior to starting antibiotics.

## 2016-11-23 NOTE — ED Notes (Signed)
Shelia-pts mother [phone]

## 2016-11-23 NOTE — Progress Notes (Signed)
Pharmacy Antibiotic Note  Crystal Dunlap is a 26 y.o. female admitted on 11/23/2016 with pneumonia.  Pharmacy has been consulted for Levaquin and aztreonam dosing.  Has listed allergy to cefazolin. No abx use in the system. Tmax of 101.8. LA elevated at 2.05. SCr elevated but has ESRD.  Plan: Give Levaquin 750mg  IV x 1, then start Levaquin 500mg  IV Q48h Give aztreonam 500mg  IV Q12h Monitor clinical picture, renal function F/U C&S, abx deescalation / LOT  Weight: 108 lb (49 kg)  Temp (24hrs), Avg:101.8 F (38.8 C), Min:101.8 F (38.8 C), Max:101.8 F (38.8 C)   Recent Labs Lab 11/23/16 1106  LATICACIDVEN 2.05*    CrCl cannot be calculated (Patient's most recent lab result is older than the maximum 21 days allowed.).    Allergies  Allergen Reactions  . Ferrlecit [Na Ferric Gluc Cplx In Sucrose] Anaphylaxis    Anaphylactic shock/severe angioedema on 08/09/15 related to IV iron Ferrlecit IV)  required intubation for respiratory failure.  . Cefazolin   . Cyclosporine     Loss of motor skills  . Lactose Intolerance (Gi)   . Latex   . Vancomycin Rash    Antimicrobials this admission: Levaquin 5/28 >>  Aztreonam 5/28 >>   Dose adjustments this admission: n/a  Microbiology results: 5/28 BCx: sent 5/28 Rapid strep negative 5/28 Group A strep Cx: sent  Thank you for allowing pharmacy to be a part of this patient's care.  Elenor Quinones, PharmD, BCPS Clinical Pharmacist Pager [phone] 11/23/2016 11:20 AM

## 2016-11-23 NOTE — Progress Notes (Signed)
Arrived by [facility] from [facility]. Dr. Hal Hope notified

## 2016-11-23 NOTE — ED Provider Notes (Signed)
[facility] DEPT [facility] Provider Note   CSN: [id number] Arrival date & time: 11/23/16  3710     History   Chief Complaint Chief Complaint  Patient presents with  . URI    HPI Jennessa Trigo is a 26 y.o. female.  HPI Patient presents with fever. Has had cough and nasal congestion and sore throat for last few days. She is a dialysis patient. Sent in from dialysis due to the fever. They called and said that the dialysis center wanted more of a workup because she has allergies. Dialyzed to a chest wall catheter. Has had 2 previous kidney transplants. No nausea. States has a headache and sore throat. States that she hurts all over. She is on Coumadin for previous pulmonary emboli and clots in her arms. Past Medical History:  Diagnosis Date  . Anemia   . Dialysis patient ([place])   . ESRD (end stage renal disease) ([place]) Feb. 12, 2010   First date of Dialysis   . Focal segmental glomerulosclerosis   . GERD (gastroesophageal reflux disease)   . Hyperlipidemia   . Hypertension   . Pulmonary emboli ([place])   . Secondary hyperparathyroidism [facility])     Patient Active Problem List   Diagnosis Date Noted  . Respiratory failure ([place])   . Allergic reaction 08/09/2015  . Anaphylaxis 08/09/2015  . 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 ESRD on dialysis ([place]) 08/09/2015  . Acute respiratory failure with hypoxia ([place]) 08/09/2015    Past Surgical History:  Procedure Laterality Date  . KIDNEY TRANSPLANT      OB History    No data available       Home Medications    Prior to Admission medications   Medication Sig Start Date End Date Taking? Authorizing Provider  benzonatate (TESSALON) 100 MG capsule Take 1 capsule (100 mg total) by mouth every 8 (eight) hours. 08/16/15   Vira Blanco, MD  calcium acetate (PHOSLO) 667 MG capsule Take 1,334 mg by mouth 3 (three) times daily with meals.     [provider]  carvedilol (COREG) 25 MG tablet Take 25 mg by mouth 2 (two) times daily  with a meal.    [provider]  cloNIDine HCl (KAPVAY) 0.1 MG TB12 ER tablet Take 0.1 mg by mouth 2 (two) times daily.     [provider]  diphenhydrAMINE (BENADRYL) 25 mg capsule Take 1 capsule (25 mg total) by mouth every 6 (six) hours as needed for allergies. 08/14/15   Ghimire, Henreitta Leber, MD  EPINEPHrine 0.3 mg/0.3 mL IJ SOAJ injection Inject 0.3 mLs (0.3 mg total) into the muscle once. 08/16/15   Vira Blanco, MD  folic acid (FOLVITE) 1 MG tablet Take 1 mg by mouth daily.    [provider]  metroNIDAZOLE (METROGEL VAGINAL) 0.75 % vaginal gel Place 1 Applicatorful vaginally 2 (two) times daily. 08/27/15   Fransico Meadow, PA-C  multivitamin (RENA-VIT) TABS tablet Take 1 tablet by mouth daily. 02/12/15   [provider]  pantoprazole (PROTONIX) 40 MG tablet Take 1 tablet (40 mg total) by mouth 2 (two) times daily before a meal. 08/15/15   Ghimire, Henreitta Leber, MD  predniSONE (DELTASONE) 10 MG tablet Take 1 tablet (10 mg total) by mouth daily with breakfast. For 2 days and then stop 08/16/15   Ghimire, Henreitta Leber, MD  ranitidine (ZANTAC) 300 MG capsule Take 300 mg by mouth every evening.    [provider]  rosuvastatin (CRESTOR) 10 MG tablet Take 10 mg by  mouth daily.    [provider]  warfarin (COUMADIN) 1 MG tablet Take 2 mg by mouth daily after supper.     [provider]  warfarin (COUMADIN) 5 MG tablet Take 5 mg by mouth daily after supper.     [provider]    Family History History reviewed. No pertinent family history.  Social History Social History  Substance Use Topics  . Smoking status: Never Smoker  . Smokeless tobacco: Never Used  . Alcohol use No     Allergies   Ferrlecit [na ferric gluc cplx in sucrose]; Cefazolin; Cyclosporine; Lactose intolerance (gi); Latex; and Vancomycin   Review of Systems Review of Systems  Constitutional: Positive for chills and fatigue.  HENT: Positive for congestion  and sore throat.   Respiratory: Positive for cough.   Cardiovascular: Positive for chest pain.  Gastrointestinal: Negative for abdominal pain.  Genitourinary: Negative for flank pain.  Musculoskeletal: Positive for myalgias. Negative for back pain.  Skin: Negative for rash.  Neurological: Negative for seizures.  Hematological: Negative for adenopathy.  Psychiatric/Behavioral: Negative for confusion.     Physical Exam Updated Vital Signs BP 121/83   Pulse 100   Temp 100.2 F (37.9 C) (Oral)   Resp 13   Wt 49 kg (108 lb)   SpO2 100%   BMI 19.75 kg/m   Physical Exam  Constitutional: She appears well-developed.  Patient appears somewhat uncomfortable  HENT:  Head: Atraumatic.  Mouth/Throat: Oropharyngeal exudate present.  Erythema with small white Exudative areas on bilateral tonsils.  Neck: Neck supple.  No meningismus  Cardiovascular:  No murmur heard. Pulmonary/Chest: Effort normal.  Dialysis catheter left upper chest wall. No surrounding erythema.  Abdominal: There is no tenderness.  Musculoskeletal: She exhibits no edema.  Neurological: She is alert.  Skin: Skin is warm. Capillary refill takes less than 2 seconds.     ED Treatments / Results  Labs (all labs ordered are listed, but only abnormal results are displayed) Labs Reviewed  COMPREHENSIVE METABOLIC PANEL - Abnormal; Notable for the following:       Result Value   Chloride 96 (*)    BUN 24 (*)    Creatinine, Ser 6.97 (*)    Total Protein 8.3 (*)    AST 52 (*)    GFR calc non Af Amer 7 (*)    GFR calc Af Amer 8 (*)    All other components within normal limits  CBC WITH DIFFERENTIAL/PLATELET - Abnormal; Notable for the following:    WBC 3.0 (*)    RBC 3.20 (*)    Hemoglobin 10.0 (*)    HCT 31.3 (*)    RDW 16.6 (*)    Lymphs Abs 0.3 (*)    All other components within normal limits  PROTIME-INR - Abnormal; Notable for the following:    Prothrombin Time 32.7 (*)    All other components within  normal limits  I-STAT CG4 LACTIC ACID, ED - Abnormal; Notable for the following:    Lactic Acid, Venous 2.05 (*)    All other components within normal limits  RAPID STREP SCREEN (NOT AT [facility])  CULTURE, BLOOD (ROUTINE X 2)  CULTURE, BLOOD (ROUTINE X 2)  CULTURE, GROUP A STREP [facility])    EKG  EKG Interpretation None       Radiology Dg Chest 2 View  Result Date: 11/23/2016 CLINICAL DATA:  Cough and headache since last night, on dialysis EXAM: CHEST  2 VIEW COMPARISON:  08/17/2015 FINDINGS: LEFT jugular central  venous catheter with tip projecting over high RIGHT atrium. Wall stents identified within the distal LEFT brachiocephalic vein and SVC. Upper normal heart size. Mediastinal contours and pulmonary vascularity normal. BILATERAL nipple shadows. Lungs clear. No pleural effusion or pneumothorax. Bones unremarkable. IMPRESSION: No acute abnormalities. Electronically Signed   By: Lavonia Dana M.D.   On: 11/23/2016 10:27    Procedures Procedures (including critical care time)  Medications Ordered in ED Medications  sodium chloride 0.9 % bolus 500 mL (500 mLs Intravenous Refused 11/23/16 1106)  levofloxacin (LEVAQUIN) IVPB 750 mg (750 mg Intravenous New Bag/Given 11/23/16 1245)  aztreonam (AZACTAM) 1 g injection (not administered)  aztreonam (AZACTAM) 500 mg in dextrose 5 % 50 mL IVPB (not administered)  levofloxacin (LEVAQUIN) IVPB 500 mg (not administered)  fentaNYL (SUBLIMAZE) injection 50 mcg (not administered)  acetaminophen (TYLENOL) tablet 1,000 mg (1,000 mg Oral Given 11/23/16 1045)  aztreonam (AZACTAM) 1 g in dextrose 5 % 50 mL IVPB (0 g Intravenous Stopped 11/23/16 1259)  fentaNYL (SUBLIMAZE) injection 50 mcg (50 mcg Intravenous Given 11/23/16 1234)     Initial Impression / Assessment and Plan / ED Course  I have reviewed the triage vital signs and the nursing notes.  Pertinent labs & imaging results that were available during my care of the patient were reviewed by me and  considered in my medical decision making (see chart for details).     Patient resents with fevers. Cough nasal congestion muscle aches and headache. She is on dialysis and was sent in by the dialysis center. Has had cough and is a red erythematous throat. Lab work overall reassuring. Lactic acid mildly elevated. Good blood pressure. Patient felt better after initial fentanyl. Now is tearful again. She is crying and says that she feels bad. Started empirically on Levaquin and aztreonam. Dosed by pharmacy since she is dialysis. Patient refused fluid bolus and due to difficult IV stick we are unable to get blood cultures initially and patient has refused since. Will admit to hospital for further evaluation and treatment.  Final Clinical Impressions(s) / ED Diagnoses   Final diagnoses:  Acute upper respiratory infection  End stage renal disease on dialysis [facility])    New Prescriptions New Prescriptions   No medications on file     Davonna Belling, MD 11/23/16 1411

## 2016-11-23 NOTE — ED Notes (Signed)
Attempting IV access.

## 2016-11-23 NOTE — ED Triage Notes (Signed)
Cough, nasal congestion x 2 days.  Also reports HA.

## 2016-11-23 NOTE — ED Notes (Signed)
Paged hospitalist via [place] for [facility] @ 2:13 pm

## 2016-11-23 NOTE — Progress Notes (Signed)
     Patient coming from [facility] for treatment of fever and other symptoms of unknown etiology. Patient completed dialysis session just prior to presentation at [facility]. Dialyzes Monday Wednesday Friday. Chemistry show typical ESRD abnormalities with addition of lactic acid 2.05, hemoglobin 10.0, WBC 3.0. Patient noted to be febrile to 101.8 with URI type symptoms and exam findings. Patient treated with aztreonam and Levaquin and pain medications with initial improvement however patient with subjective deterioration in symptoms since that time. Patient will have a flu swab taken prior to being brought to [facility]. Patient accepted to [facility] under [facility] service for observation admission. Currently patient is refusing blood cultures and IV fluids. Nephrology will be consult only if warranted based on length of stay.   Linna Darner, MD [facility] 11/23/2016, 2:43 PM

## 2016-11-23 NOTE — H&P (Signed)
History and Physical    Curley Fayette VQQ:[id number] DOB: 1990-11-26 DOA: 11/23/2016  PCP: Pediatrics, [facility]  Patient coming from: Home.  Chief Complaint: Fever and chills.  HPI: Crystal Dunlap is a 26 y.o. female with history of ESRD on hemodialysis, history of DVT, hypertension presents to the ER with complaints of fever and chills. Patient has been having these symptoms for last 3 days. Also has been having pleuritic type of chest pain with nonproductive cough.   ED Course: In the ER patient was found to be febrile with leukopenia. Chest x-ray does not show any definite infiltrates. Patient was started on attempting antibiotics and admitted for further management of fever with no clear source. On my exam patient is still complaining of some pleuritic-type of chest pain.  Review of Systems: As per HPI, rest all negative.   Past Medical History:  Diagnosis Date  . Anemia   . Dialysis patient ([place])   . ESRD (end stage renal disease) ([place]) Feb. 12, 2010   First date of Dialysis   . Focal segmental glomerulosclerosis   . GERD (gastroesophageal reflux disease)   . Hyperlipidemia   . Hypertension   . Pulmonary emboli ([place])   . Secondary hyperparathyroidism [facility])     Past Surgical History:  Procedure Laterality Date  . KIDNEY TRANSPLANT       reports that she has never smoked. She has never used smokeless tobacco. She reports that she does not drink alcohol or use drugs.  Allergies  Allergen Reactions  . Ferrlecit [Na Ferric Gluc Cplx In Sucrose] Anaphylaxis    Anaphylactic shock/severe angioedema on 08/09/15 related to IV iron Ferrlecit IV)  required intubation for respiratory failure.  . Cefazolin   . Cyclosporine     Loss of motor skills  . Lactose Intolerance (Gi)   . Latex   . Vancomycin Rash    History reviewed. No pertinent family history.  Prior to Admission medications   Medication Sig Start Date End Date Taking? Authorizing Provider    benzonatate (TESSALON) 100 MG capsule Take 1 capsule (100 mg total) by mouth every 8 (eight) hours. 08/16/15   Vira Blanco, MD  calcium acetate (PHOSLO) 667 MG capsule Take 1,334 mg by mouth 3 (three) times daily with meals.     [provider]  carvedilol (COREG) 25 MG tablet Take 25 mg by mouth 2 (two) times daily with a meal.    [provider]  cloNIDine HCl (KAPVAY) 0.1 MG TB12 ER tablet Take 0.1 mg by mouth 2 (two) times daily.     [provider]  diphenhydrAMINE (BENADRYL) 25 mg capsule Take 1 capsule (25 mg total) by mouth every 6 (six) hours as needed for allergies. 08/14/15   Ghimire, Henreitta Leber, MD  EPINEPHrine 0.3 mg/0.3 mL IJ SOAJ injection Inject 0.3 mLs (0.3 mg total) into the muscle once. 08/16/15   Vira Blanco, MD  folic acid (FOLVITE) 1 MG tablet Take 1 mg by mouth daily.    [provider]  metroNIDAZOLE (METROGEL VAGINAL) 0.75 % vaginal gel Place 1 Applicatorful vaginally 2 (two) times daily. 08/27/15   Fransico Meadow, PA-C  multivitamin (RENA-VIT) TABS tablet Take 1 tablet by mouth daily. 02/12/15   [provider]  pantoprazole (PROTONIX) 40 MG tablet Take 1 tablet (40 mg total) by mouth 2 (two) times daily before a meal. 08/15/15   Ghimire, Henreitta Leber, MD  predniSONE (DELTASONE) 10 MG tablet Take 1 tablet (10 mg total) by mouth  daily with breakfast. For 2 days and then stop 08/16/15   Ghimire, Henreitta Leber, MD  ranitidine (ZANTAC) 300 MG capsule Take 300 mg by mouth every evening.    [provider]  rosuvastatin (CRESTOR) 10 MG tablet Take 10 mg by mouth daily.    [provider]  warfarin (COUMADIN) 1 MG tablet Take 2 mg by mouth daily after supper.     [provider]  warfarin (COUMADIN) 5 MG tablet Take 5 mg by mouth daily after supper.     [provider]    Physical Exam: Vitals:   11/23/16 1715 11/23/16 1745 11/23/16 1901 11/23/16 1935  BP: 139/84 (!) 140/98 (!) 150/109   Pulse: 69 69 89    Resp: 15 16 18    Temp:   (!) 100.7 F (38.2 C)   TempSrc:   Oral   SpO2: 100% 100% 99% 99%  Weight:          Constitutional: Moderately built and nourished. Vitals:   11/23/16 1715 11/23/16 1745 11/23/16 1901 11/23/16 1935  BP: 139/84 (!) 140/98 (!) 150/109   Pulse: 69 69 89   Resp: 15 16 18    Temp:   (!) 100.7 F (38.2 C)   TempSrc:   Oral   SpO2: 100% 100% 99% 99%  Weight:       Eyes: Anicteric no pallor. ENMT: No discharge from the ears eyes nose and mouth. Neck: No mass felt. No JVD appreciated. Respiratory: No rhonchi or crepitations. Cardiovascular: S1-S2 heard. Abdomen: Soft nontender bowel sounds present. No guarding or rigidity. Musculoskeletal: No edema. No joint effusion. Skin: No rash. Skin is warm. Neurologic: Alert awake oriented to time place and person. Moves all extremities. Psychiatric: Appears normal. Normal affect.   Labs on Admission: I have personally reviewed following labs and imaging studies  CBC:  Recent Labs Lab 11/23/16 1146  WBC 3.0*  NEUTROABS 2.3  HGB 10.0*  HCT 31.3*  MCV 97.8  PLT REPEATED TO VERIFY   Basic Metabolic Panel:  Recent Labs Lab 11/23/16 1052  NA 135  K 4.2  CL 96*  CO2 24  GLUCOSE 72  BUN 24*  CREATININE 6.97*  CALCIUM 9.6   GFR: CrCl cannot be calculated (Unknown ideal weight.). Liver Function Tests:  Recent Labs Lab 11/23/16 1052  AST 52*  ALT 28  ALKPHOS 65  BILITOT 0.8  PROT 8.3*  ALBUMIN 4.6   No results for input(s): LIPASE, AMYLASE in the last 168 hours. No results for input(s): AMMONIA in the last 168 hours. Coagulation Profile:  Recent Labs Lab 11/23/16 1146  INR 3.11   Cardiac Enzymes: No results for input(s): CKTOTAL, CKMB, CKMBINDEX, TROPONINI in the last 168 hours. BNP (last 3 results) No results for input(s): PROBNP in the last 8760 hours. HbA1C: No results for input(s): HGBA1C in the last 72 hours. CBG: No results for input(s): GLUCAP in the last 168  hours. Lipid Profile: No results for input(s): CHOL, HDL, LDLCALC, TRIG, CHOLHDL, LDLDIRECT in the last 72 hours. Thyroid Function Tests: No results for input(s): TSH, T4TOTAL, FREET4, T3FREE, THYROIDAB in the last 72 hours. Anemia Panel: No results for input(s): VITAMINB12, FOLATE, FERRITIN, TIBC, IRON, RETICCTPCT in the last 72 hours. Urine analysis: No results found for: COLORURINE, APPEARANCEUR, LABSPEC, [place], GLUCOSEU, HGBUR, BILIRUBINUR, KETONESUR, PROTEINUR, UROBILINOGEN, NITRITE, LEUKOCYTESUR Sepsis Labs: @LABRCNTIP (procalcitonin:4,lacticidven:4) ) Recent Results (from the past 240 hour(s))  Rapid strep screen     Status: None   Collection Time: 11/23/16 10:11 AM  Result  Value Ref Range Status   Streptococcus, Group A Screen (Direct) NEGATIVE NEGATIVE Final    Comment: (NOTE) A Rapid Antigen test may result negative if the antigen level in the sample is below the detection level of this test. The FDA has not cleared this test as a stand-alone test therefore the rapid antigen negative result has reflexed to a Group A Strep culture.      Radiological Exams on Admission: Dg Chest 2 View  Result Date: 11/23/2016 CLINICAL DATA:  Cough and headache since last night, on dialysis EXAM: CHEST  2 VIEW COMPARISON:  08/17/2015 FINDINGS: LEFT jugular central venous catheter with tip projecting over high RIGHT atrium. Wall stents identified within the distal LEFT brachiocephalic vein and SVC. Upper normal heart size. Mediastinal contours and pulmonary vascularity normal. BILATERAL nipple shadows. Lungs clear. No pleural effusion or pneumothorax. Bones unremarkable. IMPRESSION: No acute abnormalities. Electronically Signed   By: Lavonia Dana M.D.   On: 11/23/2016 10:27    EKG: Independently reviewed. Normal sinus rhythm.  Assessment/Plan Principal Problem:   SIRS (systemic inflammatory response syndrome) (HCC) Active Problems:   End stage renal disease on dialysis (HCC)   FUO (fever  of unknown origin)   History of DVT (deep vein thrombosis)   Anemia in ESRD (end-stage renal disease) (HCC)   Thrombocytopenia (HCC)   Essential hypertension    1. SIRS - likely source could be respiratory tract. Patient is placed on Levaquin Azacatam and I have ordered 1 dose of Zyvox. Further doses will need infectious disease consult. Follow blood cultures. Since patient has pleuritic type of chest pain I have ordered CT angiogram of the chest. Check respiratory viral panel. 2. History of DVT on Coumadin which will be dosed per pharmacy. 3. ESRD on hemodialysis on Monday Wednesday and Friday. Consult nephrologist. 4. Hypertension - continue home medications. 5. Anemia of renal disease - follow CBC.   DVT prophylaxis: Coumadin. Code Status: Full code.  Family Communication: Discussed with patient.  Disposition Plan: Home.  Consults called: None.  Admission status: Observation.    Rise Patience MD [facility] Pager [phone].  If 7PM-7AM, please contact night-coverage [email or web address] Password [facility]  11/23/2016, 8:03 PM

## 2016-11-24 ENCOUNTER — Observation Stay (HOSPITAL_COMMUNITY): Payer: Medicare Other

## 2016-11-24 DIAGNOSIS — Z940 Kidney transplant status: Secondary | ICD-10-CM | POA: Diagnosis not present

## 2016-11-24 DIAGNOSIS — Z86711 Personal history of pulmonary embolism: Secondary | ICD-10-CM | POA: Diagnosis not present

## 2016-11-24 DIAGNOSIS — Z86718 Personal history of other venous thrombosis and embolism: Secondary | ICD-10-CM | POA: Diagnosis not present

## 2016-11-24 DIAGNOSIS — J189 Pneumonia, unspecified organism: Secondary | ICD-10-CM | POA: Diagnosis present

## 2016-11-24 DIAGNOSIS — D696 Thrombocytopenia, unspecified: Secondary | ICD-10-CM

## 2016-11-24 DIAGNOSIS — N2581 Secondary hyperparathyroidism of renal origin: Secondary | ICD-10-CM | POA: Diagnosis present

## 2016-11-24 DIAGNOSIS — I10 Essential (primary) hypertension: Secondary | ICD-10-CM | POA: Diagnosis not present

## 2016-11-24 DIAGNOSIS — R609 Edema, unspecified: Secondary | ICD-10-CM | POA: Diagnosis present

## 2016-11-24 DIAGNOSIS — Z91040 Latex allergy status: Secondary | ICD-10-CM | POA: Diagnosis not present

## 2016-11-24 DIAGNOSIS — Z992 Dependence on renal dialysis: Secondary | ICD-10-CM | POA: Diagnosis not present

## 2016-11-24 DIAGNOSIS — D72819 Decreased white blood cell count, unspecified: Secondary | ICD-10-CM | POA: Diagnosis present

## 2016-11-24 DIAGNOSIS — R6510 Systemic inflammatory response syndrome (SIRS) of non-infectious origin without acute organ dysfunction: Secondary | ICD-10-CM | POA: Diagnosis not present

## 2016-11-24 DIAGNOSIS — E8889 Other specified metabolic disorders: Secondary | ICD-10-CM | POA: Diagnosis present

## 2016-11-24 DIAGNOSIS — A419 Sepsis, unspecified organism: Secondary | ICD-10-CM | POA: Diagnosis present

## 2016-11-24 DIAGNOSIS — K219 Gastro-esophageal reflux disease without esophagitis: Secondary | ICD-10-CM | POA: Diagnosis present

## 2016-11-24 DIAGNOSIS — Z7901 Long term (current) use of anticoagulants: Secondary | ICD-10-CM | POA: Diagnosis not present

## 2016-11-24 DIAGNOSIS — E739 Lactose intolerance, unspecified: Secondary | ICD-10-CM | POA: Diagnosis present

## 2016-11-24 DIAGNOSIS — R110 Nausea: Secondary | ICD-10-CM | POA: Diagnosis not present

## 2016-11-24 DIAGNOSIS — J069 Acute upper respiratory infection, unspecified: Secondary | ICD-10-CM | POA: Diagnosis present

## 2016-11-24 DIAGNOSIS — N186 End stage renal disease: Secondary | ICD-10-CM | POA: Diagnosis present

## 2016-11-24 DIAGNOSIS — Z888 Allergy status to other drugs, medicaments and biological substances status: Secondary | ICD-10-CM | POA: Diagnosis not present

## 2016-11-24 DIAGNOSIS — Z79899 Other long term (current) drug therapy: Secondary | ICD-10-CM | POA: Diagnosis not present

## 2016-11-24 DIAGNOSIS — D631 Anemia in chronic kidney disease: Secondary | ICD-10-CM | POA: Diagnosis present

## 2016-11-24 LAB — CBC WITH DIFFERENTIAL/PLATELET
BASOS PCT: 0 %
Basophils Absolute: 0 10*3/uL (ref 0.0–0.1)
EOS ABS: 0.1 10*3/uL (ref 0.0–0.7)
Eosinophils Relative: 2 %
HEMATOCRIT: 28.9 % — AB (ref 36.0–46.0)
HEMOGLOBIN: 8.9 g/dL — AB (ref 12.0–15.0)
LYMPHS ABS: 0.6 10*3/uL — AB (ref 0.7–4.0)
Lymphocytes Relative: 21 %
MCH: 30.7 pg (ref 26.0–34.0)
MCHC: 30.8 g/dL (ref 30.0–36.0)
MCV: 99.7 fL (ref 78.0–100.0)
Monocytes Absolute: 0.1 10*3/uL (ref 0.1–1.0)
Monocytes Relative: 3 %
NEUTROS ABS: 2.3 10*3/uL (ref 1.7–7.7)
Neutrophils Relative %: 74 %
Platelets: 111 10*3/uL — ABNORMAL LOW (ref 150–400)
RBC: 2.9 MIL/uL — AB (ref 3.87–5.11)
RDW: 17 % — ABNORMAL HIGH (ref 11.5–15.5)
WBC: 3.1 10*3/uL — AB (ref 4.0–10.5)

## 2016-11-24 LAB — COMPREHENSIVE METABOLIC PANEL
ALBUMIN: 3.8 g/dL (ref 3.5–5.0)
ALT: 22 U/L (ref 14–54)
AST: 28 U/L (ref 15–41)
Alkaline Phosphatase: 60 U/L (ref 38–126)
Anion gap: 15 (ref 5–15)
BILIRUBIN TOTAL: 0.6 mg/dL (ref 0.3–1.2)
BUN: 40 mg/dL — ABNORMAL HIGH (ref 6–20)
CALCIUM: 8.5 mg/dL — AB (ref 8.9–10.3)
CO2: 23 mmol/L (ref 22–32)
CREATININE: 10.61 mg/dL — AB (ref 0.44–1.00)
Chloride: 96 mmol/L — ABNORMAL LOW (ref 101–111)
GFR calc Af Amer: 5 mL/min — ABNORMAL LOW (ref >60)
GFR calc non Af Amer: 4 mL/min — ABNORMAL LOW (ref >60)
GLUCOSE: 78 mg/dL (ref 65–99)
Potassium: 4.7 mmol/L (ref 3.5–5.1)
SODIUM: 134 mmol/L — AB (ref 135–145)
Total Protein: 7.1 g/dL (ref 6.5–8.1)

## 2016-11-24 LAB — RESPIRATORY PANEL BY PCR
Adenovirus: NOT DETECTED
Bordetella pertussis: NOT DETECTED
CHLAMYDOPHILA PNEUMONIAE-RVPPCR: NOT DETECTED
CORONAVIRUS NL63-RVPPCR: NOT DETECTED
CORONAVIRUS OC43-RVPPCR: NOT DETECTED
Coronavirus 229E: NOT DETECTED
Coronavirus HKU1: NOT DETECTED
INFLUENZA A-RVPPCR: NOT DETECTED
INFLUENZA B-RVPPCR: NOT DETECTED
Metapneumovirus: NOT DETECTED
Mycoplasma pneumoniae: NOT DETECTED
PARAINFLUENZA VIRUS 1-RVPPCR: NOT DETECTED
PARAINFLUENZA VIRUS 3-RVPPCR: NOT DETECTED
PARAINFLUENZA VIRUS 4-RVPPCR: NOT DETECTED
Parainfluenza Virus 2: NOT DETECTED
RESPIRATORY SYNCYTIAL VIRUS-RVPPCR: NOT DETECTED
Rhinovirus / Enterovirus: DETECTED — AB

## 2016-11-24 LAB — TROPONIN I
Troponin I: <0.03 ng/mL (ref <0.03)
Troponin I: <0.03 ng/mL (ref <0.03)

## 2016-11-24 LAB — PROTIME-INR
INR: 3.08
PROTHROMBIN TIME: 32.4 s — AB (ref 11.4–15.2)

## 2016-11-24 LAB — HIV ANTIBODY (ROUTINE TESTING W REFLEX): HIV Screen 4th Generation wRfx: NONREACTIVE

## 2016-11-24 LAB — MRSA PCR SCREENING: MRSA by PCR: NEGATIVE

## 2016-11-24 MED ORDER — WARFARIN SODIUM 4 MG PO TABS
4.0000 mg | ORAL_TABLET | Freq: Once | ORAL | Status: AC
  Administered 2016-11-24: 4 mg via ORAL
  Filled 2016-11-24 (×2): qty 1

## 2016-11-24 NOTE — Discharge Instructions (Signed)
Information on my medicine - Coumadin   (Warfarin)  This medication education was reviewed with me or my healthcare representative as part of my discharge preparation.  The pharmacist that spoke with me during my hospital stay was:  Saundra Shelling, [facility], [place]  Why was Coumadin prescribed for you? Coumadin was prescribed for you because you have a blood clot or a medical condition that can cause an increased risk of forming blood clots. Blood clots can cause serious health problems by blocking the flow of blood to the heart, lung, or brain. Coumadin can prevent harmful blood clots from forming. As a reminder your indication for Coumadin is:   Pulmonary Embolism Treatment  What test will check on my response to Coumadin? While on Coumadin (warfarin) you will need to have an INR test regularly to ensure that your dose is keeping you in the desired range. The INR (international normalized ratio) number is calculated from the result of the laboratory test called prothrombin time (PT).  If an INR APPOINTMENT HAS NOT ALREADY BEEN MADE FOR YOU please schedule an appointment to have this lab work done by your health care provider within 7 days. Your INR goal is usually a number between:  2 to 3 or your provider may give you a more narrow range like 2-2.5.  Ask your health care provider during an office visit what your goal INR is.  What  do you need to  know  About  COUMADIN? Take Coumadin (warfarin) exactly as prescribed by your healthcare provider about the same time each day.  DO NOT stop taking without talking to the doctor who prescribed the medication.  Stopping without other blood clot prevention medication to take the place of Coumadin may increase your risk of developing a new clot or stroke.  Get refills before you run out.  What do you do if you miss a dose? If you miss a dose, take it as soon as you remember on the same day then continue your regularly scheduled regimen the next day.  Do not take two  doses of Coumadin at the same time.  Important Safety Information A possible side effect of Coumadin (Warfarin) is an increased risk of bleeding. You should call your healthcare provider right away if you experience any of the following: ? Bleeding from an injury or your nose that does not stop. ? Unusual colored urine (red or dark brown) or unusual colored stools (red or black). ? Unusual bruising for unknown reasons. ? A serious fall or if you hit your head (even if there is no bleeding).  Some foods or medicines interact with Coumadin (warfarin) and might alter your response to warfarin. To help avoid this: ? Eat a balanced diet, maintaining a consistent amount of Vitamin K. ? Notify your provider about major diet changes you plan to make. ? Avoid alcohol or limit your intake to 1 drink for women and 2 drinks for men per day. (1 drink is 5 oz. wine, 12 oz. beer, or 1.5 oz. liquor.)  Make sure that ANY health care provider who prescribes medication for you knows that you are taking Coumadin (warfarin).  Also make sure the healthcare provider who is monitoring your Coumadin knows when you have started a new medication including herbals and non-prescription products.  Coumadin (Warfarin)  Major Drug Interactions  Increased Warfarin Effect Decreased Warfarin Effect  Alcohol (large quantities) Antibiotics (esp. Septra/Bactrim, Flagyl, Cipro) Amiodarone (Cordarone) Aspirin (ASA) Cimetidine (Tagamet) Megestrol (Megace) NSAIDs (ibuprofen, naproxen, etc.) Piroxicam (  Feldene) °Propafenone (Rythmol SR) °Propranolol (Inderal) °Isoniazid (INH) °Posaconazole (Noxafil) Barbiturates (Phenobarbital) °Carbamazepine (Tegretol) °Chlordiazepoxide (Librium) °Cholestyramine (Questran) °Griseofulvin °Oral Contraceptives °Rifampin °Sucralfate (Carafate) °Vitamin K  ° °Coumadin® (Warfarin) Major Herbal Interactions  °Increased Warfarin Effect Decreased Warfarin Effect  °Garlic °Ginseng °Ginkgo biloba Coenzyme  Q10 °Green tea °St. John’s wort   ° °Coumadin® (Warfarin) FOOD Interactions  °Eat a consistent number of servings per week of foods HIGH in Vitamin K °(1 serving = ½ cup)  °Collards (cooked, or boiled & drained) °Kale (cooked, or boiled & drained) °Mustard greens (cooked, or boiled & drained) °Parsley *serving size only = ¼ cup °Spinach (cooked, or boiled & drained) °Swiss chard (cooked, or boiled & drained) °Turnip greens (cooked, or boiled & drained)  °Eat a consistent number of servings per week of foods MEDIUM-HIGH in Vitamin K °(1 serving = 1 cup)  °Asparagus (cooked, or boiled & drained) °Broccoli (cooked, boiled & drained, or raw & chopped) °Brussel sprouts (cooked, or boiled & drained) *serving size only = ½ cup °Lettuce, raw (green leaf, endive, romaine) °Spinach, raw °Turnip greens, raw & chopped  ° °These websites have more information on Coumadin (warfarin):  www.coumadin.com; °www.ahrq.gov/consumer/coumadin.htm; ° ° ° °

## 2016-11-24 NOTE — Progress Notes (Signed)
ANTICOAGULATION CONSULT NOTE - FOLLOW UP  Pharmacy Consult:  Coumadin Indication: History of PE/DVT  Allergies  Allergen Reactions  . Cefazolin Anaphylaxis  . Ferrlecit [Na Ferric Gluc Cplx In Sucrose] Anaphylaxis    Anaphylactic shock/severe angioedema on 08/09/15 related to IV iron Ferrlecit IV)  required intubation for respiratory failure  . Cyclosporine Other (See Comments)    Loss of motor skills  . Lactose Intolerance (Gi) Nausea Only    Stomach pains also  . Latex Rash  . Vancomycin Rash    Patient Measurements: Weight: 108 lb (49 kg)   Vital Signs: Temp: 99.8 F (37.7 C) (05/29 0507) Temp Source: Oral (05/29 0507) BP: 128/77 (05/29 0507) Pulse Rate: 86 (05/29 0507)  Labs:  Recent Labs  11/23/16 1052 11/23/16 1146 11/23/16 2054 11/24/16 0502 11/24/16 0742 11/24/16 1047  HGB  --  10.0*  --   --   --  8.9*  HCT  --  31.3*  --   --   --  28.9*  PLT  --  REPEATED TO VERIFY  --   --   --  111*  LABPROT  --  32.7*  --   --  32.4*  --   INR  --  3.11  --   --  3.08  --   CREATININE 6.97*  --   --   --   --  10.61*  TROPONINI  --   --  <0.03 <0.03  --  <0.03    CrCl cannot be calculated (Unknown ideal weight.).    Assessment: 67 YOF presented on 11/23/16 with fever, cough, congestion, and sore throat. The patient was on warfarin PTA for history of PE/DVT.  INR remains slightly supra-therapeutic; no bleeding reported.  Coumadin dose was reduced given slightly supra-therapeutic INR and initiation of Levaquin that could increase the effectiveness of Coumadin.  Home Coumadin dose:  8.5mg  PO daily   Goal of Therapy:  INR 2-3    Plan:  - Repeat Coumadin 4mg  PO today - Daily PT / INR    Kosisochukwu Goldberg D. Mina Marble, PharmD, BCPS Pager:  [phone] 11/24/2016, 12:19 PM

## 2016-11-24 NOTE — Progress Notes (Signed)
PROGRESS NOTE    Crystal Dunlap  NKN:[id number] DOB: [birth year] DOA: 11/23/2016 PCP: Pediatrics, [facility]   Brief Narrative: Crystal Dunlap is a 26 y.o. female with history of ESRD on hemodialysis, history of DVT, hypertension presents to the ER with complaints of fever and chills.   Assessment & Plan:   Principal Problem:   SIRS (systemic inflammatory response syndrome) (HCC) Active Problems:   End stage renal disease on dialysis (HCC)   FUO (fever of unknown origin)   History of DVT (deep vein thrombosis)   Anemia in ESRD (end-stage renal disease) (HCC)   Thrombocytopenia (HCC)   Essential hypertension   SIRS From multifocal pneumonia:  Admitted for IV antibiotics.  CT chest shows multifocal pneumonia.  Discussed with Dr snider and recommended IV levaquin is appropriate.    DVT: resume coumadin.    Anemia from chronic disease:  Monitor hemoglobin prn.   Mild thrombocytopenia:  Platelets at 111. Baseline around 140's. Monitor.   Hypertension: well controlled.       DVT prophylaxis: coumadin Code Status: full code.  Family Communication: none at bedside.  Disposition Plan: 1 to 2 days.    Consultants:   Nephrology.    Procedures: none.    Antimicrobials: levaquin    Subjective: BREATHING BETTER, still coughing. Not back to baseline.   Objective: Vitals:   11/23/16 1935 11/23/16 2035 11/24/16 0507 11/24/16 1430  BP:  109/67 128/77 107/72  Pulse:  88 86 83  Resp:  19 17 18   Temp:  100.1 F (37.8 C) 99.8 F (37.7 C) 98.7 F (37.1 C)  TempSrc:  Oral Oral Oral  SpO2: 99% 99% 99% 99%  Weight:      Height:    5\' 2"  (1.575 m)    Intake/Output Summary (Last 24 hours) at 11/24/16 1838 Last data filed at 11/24/16 1432  Gross per 24 hour  Intake              410 ml  Output                0 ml  Net              410 ml   Filed Weights   11/23/16 1009  Weight: 49 kg (108 lb)    Examination:  General exam: Appears calm and  comfortable  Respiratory system: Clear to auscultation. Respiratory effort normal. Cardiovascular system: S1 & S2 heard, RRR. No JVD, murmurs, rubs, gallops or clicks. No pedal edema. Gastrointestinal system: Abdomen is nondistended, soft and nontender. No organomegaly or masses felt. Normal bowel sounds heard. Central nervous system: Alert and oriented. No focal neurological deficits. Extremities: Symmetric 5 x 5 power. Skin: No rashes, lesions or ulcers Psychiatry: Judgement and insight appear normal. Mood & affect appropriate.     Data Reviewed: I have personally reviewed following labs and imaging studies  CBC:  Recent Labs Lab 11/23/16 1146 11/24/16 1047  WBC 3.0* 3.1*  NEUTROABS 2.3 2.3  HGB 10.0* 8.9*  HCT 31.3* 28.9*  MCV 97.8 99.7  PLT REPEATED TO VERIFY 379*   Basic Metabolic Panel:  Recent Labs Lab 11/23/16 1052 11/24/16 1047  NA 135 134*  K 4.2 4.7  CL 96* 96*  CO2 24 23  GLUCOSE 72 78  BUN 24* 40*  CREATININE 6.97* 10.61*  CALCIUM 9.6 8.5*   GFR: Estimated Creatinine Clearance: 6.2 mL/min (A) (by C-G formula based on SCr of 10.61 mg/dL (H)). Liver Function Tests:  Recent Labs Lab 11/23/16 1052  11/24/16 1047  AST 52* 28  ALT 28 22  ALKPHOS 65 60  BILITOT 0.8 0.6  PROT 8.3* 7.1  ALBUMIN 4.6 3.8   No results for input(s): LIPASE, AMYLASE in the last 168 hours. No results for input(s): AMMONIA in the last 168 hours. Coagulation Profile:  Recent Labs Lab 11/23/16 1146 11/24/16 0742  INR 3.11 3.08   Cardiac Enzymes:  Recent Labs Lab 11/23/16 2054 11/24/16 0502 11/24/16 1047  TROPONINI <0.03 <0.03 <0.03   BNP (last 3 results) No results for input(s): PROBNP in the last 8760 hours. HbA1C: No results for input(s): HGBA1C in the last 72 hours. CBG: No results for input(s): GLUCAP in the last 168 hours. Lipid Profile: No results for input(s): CHOL, HDL, LDLCALC, TRIG, CHOLHDL, LDLDIRECT in the last 72 hours. Thyroid Function  Tests: No results for input(s): TSH, T4TOTAL, FREET4, T3FREE, THYROIDAB in the last 72 hours. Anemia Panel: No results for input(s): VITAMINB12, FOLATE, FERRITIN, TIBC, IRON, RETICCTPCT in the last 72 hours. Sepsis Labs:  Recent Labs Lab 11/23/16 1106 11/23/16 1628 11/23/16 2054  PROCALCITON  --   --  1.72  LATICACIDVEN 2.05* 0.81 0.7    Recent Results (from the past 240 hour(s))  Rapid strep screen     Status: None   Collection Time: 11/23/16 10:11 AM  Result Value Ref Range Status   Streptococcus, Group A Screen (Direct) NEGATIVE NEGATIVE Final    Comment: (NOTE) A Rapid Antigen test may result negative if the antigen level in the sample is below the detection level of this test. The FDA has not cleared this test as a stand-alone test therefore the rapid antigen negative result has reflexed to a Group A Strep culture.   Culture, group A strep     Status: None (Preliminary result)   Collection Time: 11/23/16 10:11 AM  Result Value Ref Range Status   Specimen Description THROAT  Final   Special Requests NONE Reflexed from [id number]  Final   Culture   Final    CULTURE REINCUBATED FOR BETTER GROWTH Performed at [facility], [address] [place], [place] [address]    Report Status PENDING  Incomplete  Respiratory Panel by PCR     Status: Abnormal   Collection Time: 11/23/16  8:02 PM  Result Value Ref Range Status   Adenovirus NOT DETECTED NOT DETECTED Final   Coronavirus 229E NOT DETECTED NOT DETECTED Final   Coronavirus HKU1 NOT DETECTED NOT DETECTED Final   Coronavirus NL63 NOT DETECTED NOT DETECTED Final   Coronavirus OC43 NOT DETECTED NOT DETECTED Final   Metapneumovirus NOT DETECTED NOT DETECTED Final   Rhinovirus / Enterovirus DETECTED (A) NOT DETECTED Final   Influenza A NOT DETECTED NOT DETECTED Final   Influenza B NOT DETECTED NOT DETECTED Final   Parainfluenza Virus 1 NOT DETECTED NOT DETECTED Final   Parainfluenza Virus 2 NOT DETECTED NOT DETECTED Final    Parainfluenza Virus 3 NOT DETECTED NOT DETECTED Final   Parainfluenza Virus 4 NOT DETECTED NOT DETECTED Final   Respiratory Syncytial Virus NOT DETECTED NOT DETECTED Final   Bordetella pertussis NOT DETECTED NOT DETECTED Final   Chlamydophila pneumoniae NOT DETECTED NOT DETECTED Final   Mycoplasma pneumoniae NOT DETECTED NOT DETECTED Final  Culture, blood (routine x 2)     Status: None (Preliminary result)   Collection Time: 11/23/16  8:54 PM  Result Value Ref Range Status   Specimen Description BLOOD LEFT ANTECUBITAL  Final   Special Requests IN PEDIATRIC BOTTLE Blood  Culture adequate volume  Final   Culture NO GROWTH < 24 HOURS  Final   Report Status PENDING  Incomplete  Culture, blood (routine x 2)     Status: None (Preliminary result)   Collection Time: 11/23/16  8:54 PM  Result Value Ref Range Status   Specimen Description BLOOD LEFT ANTECUBITAL  Final   Special Requests IN PEDIATRIC BOTTLE Blood Culture adequate volume  Final   Culture NO GROWTH < 24 HOURS  Final   Report Status PENDING  Incomplete  MRSA PCR Screening     Status: None   Collection Time: 11/23/16 10:20 PM  Result Value Ref Range Status   MRSA by PCR NEGATIVE NEGATIVE Final    Comment:        The GeneXpert MRSA Assay (FDA approved for NASAL specimens only), is one component of a comprehensive MRSA colonization surveillance program. It is not intended to diagnose MRSA infection nor to guide or monitor treatment for MRSA infections.          Radiology Studies: Dg Chest 2 View  Result Date: 11/23/2016 CLINICAL DATA:  Cough and headache since last night, on dialysis EXAM: CHEST  2 VIEW COMPARISON:  08/17/2015 FINDINGS: LEFT jugular central venous catheter with tip projecting over high RIGHT atrium. Wall stents identified within the distal LEFT brachiocephalic vein and SVC. Upper normal heart size. Mediastinal contours and pulmonary vascularity normal. BILATERAL nipple shadows. Lungs clear. No pleural  effusion or pneumothorax. Bones unremarkable. IMPRESSION: No acute abnormalities. Electronically Signed   By: Lavonia Dana M.D.   On: 11/23/2016 10:27   Ct Angio Chest Pe W Or Wo Contrast  Result Date: 11/24/2016 CLINICAL DATA:  Acute onset of generalized chest pain. Initial encounter. EXAM: CT ANGIOGRAPHY CHEST WITH CONTRAST TECHNIQUE: Multidetector CT imaging of the chest was performed using the standard protocol during bolus administration of intravenous contrast. Multiplanar CT image reconstructions and MIPs were obtained to evaluate the vascular anatomy. CONTRAST:  68 mL of Isovue 370 IV contrast COMPARISON:  Chest radiograph performed 11/23/2016 FINDINGS: Cardiovascular:  There is no evidence of pulmonary embolus. The heart is normal in size. 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 There is resultant enhancement of diffuse collaterals about the mediastinum and along the diaphragm. There is enhancement of scattered collateral vasculature about the left side of the neck. The thoracic aorta is grossly unremarkable in appearance. The great vessels are unremarkable, though difficult to fully assess due to the phase of contrast enhancement. Mediastinum/Nodes: Trace pericardial fluid remains within normal limits. No mediastinal lymphadenopathy is seen, though the mediastinal soft tissues are difficult to fully characterize due to mild underlying soft tissue edema. The visualized portions of the thyroid gland are unremarkable. No axillary lymphadenopathy is appreciated. Lungs/Pleura: A trace right-sided pleural effusion appears chronic in nature. Minimal hazy airspace opacities within the upper and lower lobes bilaterally raises concern for mild multifocal pneumonia. No pneumothorax is seen. No dominant mass is identified. Upper Abdomen: The visualized portions of the liver and spleen are unremarkable in appearance.  Musculoskeletal: No acute osseous abnormalities are identified. The visualized musculature is unremarkable in appearance. Review of the MIP images confirms the above findings. IMPRESSION: 1. No evidence of pulmonary embolus. 2. Trace right-sided pleural effusion appears chronic in nature. Minimal hazy bilateral airspace opacities at the upper and lower lung lobes raises concern for mild multifocal pneumonia. Would correlate with the patient's symptoms. 3. 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 Resultant enhancement of diffuse collaterals about the mediastinum and along the diaphragm. Enhancement of scattered collateral vasculature about the left side of the neck. 4. Mediastinum difficult to fully assess due to mild underlying diffuse soft tissue edema. Electronically Signed   By: Garald Balding M.D.   On: 11/24/2016 01:05        Scheduled Meds: . amLODipine  10 mg Oral Daily  . calcium acetate  1,334 mg Oral TID WC  . famotidine  20 mg Oral Daily  . hydrALAZINE  25 mg Oral Q8H  . multivitamin  1 tablet Oral QHS  . rosuvastatin  10 mg Oral QHS  . Warfarin - Pharmacist Dosing Inpatient   Does not apply q1800   Continuous Infusions: . [START ON 11/25/2016] levofloxacin (LEVAQUIN) IV       LOS: 0 days    Time spent: 30 MINUTES.    Hosie Poisson, MD [facility] Pager [phone]   If 7PM-7AM, please contact night-coverage [email or web address] Password [facility] 11/24/2016, 6:38 PM

## 2016-11-25 DIAGNOSIS — D631 Anemia in chronic kidney disease: Secondary | ICD-10-CM

## 2016-11-25 DIAGNOSIS — Z992 Dependence on renal dialysis: Secondary | ICD-10-CM

## 2016-11-25 DIAGNOSIS — R6510 Systemic inflammatory response syndrome (SIRS) of non-infectious origin without acute organ dysfunction: Secondary | ICD-10-CM

## 2016-11-25 DIAGNOSIS — N186 End stage renal disease: Secondary | ICD-10-CM

## 2016-11-25 DIAGNOSIS — J189 Pneumonia, unspecified organism: Secondary | ICD-10-CM | POA: Diagnosis present

## 2016-11-25 LAB — PROTIME-INR
INR: 2.96
PROTHROMBIN TIME: 31.5 s — AB (ref 11.4–15.2)

## 2016-11-25 LAB — INFLUENZA PANEL BY PCR (TYPE A & B)
INFLAPCR: NEGATIVE
Influenza B By PCR: NEGATIVE

## 2016-11-25 LAB — PROCALCITONIN: Procalcitonin: 2.98 ng/mL

## 2016-11-25 LAB — CULTURE, GROUP A STREP (THRC)

## 2016-11-25 MED ORDER — DARBEPOETIN ALFA 60 MCG/0.3ML IJ SOSY
60.0000 ug | PREFILLED_SYRINGE | INTRAMUSCULAR | Status: DC
  Administered 2016-11-26: 60 ug via INTRAVENOUS
  Filled 2016-11-25: qty 0.3

## 2016-11-25 MED ORDER — WARFARIN SODIUM 4 MG PO TABS
4.0000 mg | ORAL_TABLET | Freq: Once | ORAL | Status: AC
  Administered 2016-11-25: 4 mg via ORAL
  Filled 2016-11-25: qty 1

## 2016-11-25 NOTE — Consult Note (Signed)
[facility] Renal Consultation Note    Indication for Consultation:  Management of ESRD/hemodialysis; anemia, hypertension/volume and secondary hyperparathyroidism  HPI: Crystal Dunlap is a 26 y.o. female with ESRD secondary to FSGS, HTN, h/o PE on Coumadin, h/o two failed renal transplants. 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 Recent transfer to [facility] in May.  Presented to ED on Monday after dialysis with fever and chills and chest pain. Temp 101.32F noted at dialysis center. Febrile and leukopenic WBC 3.0 on admission. Had chest CT with multifocal pneumonia. Admitted for further management. Now on IV Levaquin. Blood cultures are no growth to date.  Seen at bedside. Afebrile overnight. She says she's feeling a little better than she felt on admission. Still endorses pleuritic chest pain, worse with coughing. Appetite improving.   Dialyzes at [facility] MWF. Compliant with HD treatments. Dialyzes via R IJ TDC. History of access failures.   Past Medical History:  Diagnosis Date  . Anemia   . ESRD (end stage renal disease) ([place]) Feb. 12, 2010   First date of Dialysis   . ESRD (end stage renal disease) on dialysis ([place])    "MWF; [facility]; [place]" (11/23/2016)  . Focal segmental glomerulosclerosis   . GERD (gastroesophageal reflux disease)   . Hyperlipidemia   . Hypertension   . Pulmonary emboli ([place]) ~ 2013  . Secondary hyperparathyroidism ([place])   . SIRS (systemic inflammatory response syndrome) ([place]) 11/23/2016   Past Surgical History:  Procedure Laterality Date  . COLON SURGERY  ~ 2011/2012   "for bowel obstruction"  . INSERTION OF DIALYSIS CATHETER Left ~ 2016   chest  . KIDNEY TRANSPLANT  2007   History reviewed. No pertinent family history. Social History:  reports that she has never smoked. She has never used smokeless tobacco. She reports that she does not drink alcohol or use drugs. Allergies  Allergen Reactions  .  Cefazolin Anaphylaxis  . Ferrlecit [Na Ferric Gluc Cplx In Sucrose] Anaphylaxis    Anaphylactic shock/severe angioedema on 08/09/15 related to IV iron Ferrlecit IV)  required intubation for respiratory failure  . Cyclosporine Other (See Comments)    Loss of motor skills  . Lactose Intolerance (Gi) Nausea Only    Stomach pains also  . Latex Rash  . Vancomycin Rash   Prior to Admission medications   Medication Sig Start Date End Date Taking? Authorizing Provider  amLODipine (NORVASC) 10 MG tablet Take 10 mg by mouth daily. 07/06/16  Yes [provider]  calcium acetate (PHOSLO) 667 MG capsule Take 1,334 mg by mouth 3 (three) times daily with meals.    Yes [provider]  calcium carbonate (TUMS - DOSED IN MG ELEMENTAL CALCIUM) 500 MG chewable tablet Chew 2 tablets by mouth 3 (three) times daily with meals.    Yes [provider]  Cholecalciferol (VITAMIN D3) 2000 units capsule Take 2,000 Units by mouth daily. 05/08/16  Yes [provider]  diphenhydrAMINE (BENADRYL) 25 mg capsule Take 1 capsule (25 mg total) by mouth every 6 (six) hours as needed for allergies. 08/14/15  Yes Ghimire, Henreitta Leber, MD  EPINEPHrine 0.3 mg/0.3 mL IJ SOAJ injection Inject 0.3 mLs (0.3 mg total) into the muscle once. Patient taking differently: Inject 0.3 mg into the muscle once as needed (FOR ANAPHYLAXIS).  08/16/15  Yes Vira Blanco, MD  folic acid (FOLVITE) 1 MG tablet Take 1 mg by mouth daily.   Yes [provider]  hydrALAZINE (APRESOLINE) 25 MG tablet  Take 25 mg by mouth 3 (three) times daily. 11/22/16  Yes [provider]  multivitamin (RENA-VIT) TABS tablet Take 1 tablet by mouth daily. 02/12/15  Yes [provider]  ranitidine (ZANTAC) 300 MG capsule Take 300 mg by mouth every evening.   Yes [provider]  rosuvastatin (CRESTOR) 10 MG tablet Take 10 mg by mouth daily.   Yes [provider]  warfarin (COUMADIN) 1 MG tablet Take 3.5  mg by mouth at bedtime. IN CONJUNCTION WITH ONE 5 MG TABLET TO EQUAL A TOTAL DOSAGE OF 8.5 MILLIGRAMS   Yes [provider]  warfarin (COUMADIN) 5 MG tablet Take 5 mg by mouth at bedtime. IN CONJUNCTION WITH THREE AND ONE-HALF 1 MG TABLETS TO EQUAL A TOTAL DOSAGE OF 8.5 MILLIGRAMS   Yes [provider]  benzonatate (TESSALON) 100 MG capsule Take 1 capsule (100 mg total) by mouth every 8 (eight) hours. Patient not taking: Reported on 11/23/2016 08/16/15   Vira Blanco, MD  metroNIDAZOLE (METROGEL VAGINAL) 0.75 % vaginal gel Place 1 Applicatorful vaginally 2 (two) times daily. Patient not taking: Reported on 11/23/2016 08/27/15   Fransico Meadow, PA-C  pantoprazole (PROTONIX) 40 MG tablet Take 1 tablet (40 mg total) by mouth 2 (two) times daily before a meal. Patient not taking: Reported on 11/23/2016 08/15/15   Jonetta Osgood, MD  predniSONE (DELTASONE) 10 MG tablet Take 1 tablet (10 mg total) by mouth daily with breakfast. For 2 days and then stop Patient not taking: Reported on 11/23/2016 08/16/15   Jonetta Osgood, MD   Current Facility-Administered Medications  Medication Dose Route Frequency Provider Last Rate Last Dose  . acetaminophen (TYLENOL) tablet 650 mg  650 mg Oral Q6H PRN Rise Patience, MD       Or  . acetaminophen (TYLENOL) suppository 650 mg  650 mg Rectal Q6H PRN Rise Patience, MD      . amLODipine (NORVASC) tablet 10 mg  10 mg Oral Daily Rise Patience, MD   10 mg at 11/24/16 0835  . calcium acetate (PHOSLO) capsule 1,334 mg  1,334 mg Oral TID WC Rise Patience, MD   1,334 mg at 11/25/16 0913  . famotidine (PEPCID) tablet 20 mg  20 mg Oral Daily Rise Patience, MD   20 mg at 11/25/16 0913  . fentaNYL (SUBLIMAZE) injection 12.5 mcg  12.5 mcg Intravenous Q4H PRN Rise Patience, MD   12.5 mcg at 11/25/16 0940  . hydrALAZINE (APRESOLINE) injection 10 mg  10 mg Intravenous Q4H PRN Rise Patience, MD      . hydrALAZINE  (APRESOLINE) tablet 25 mg  25 mg Oral Q8H Rise Patience, MD   25 mg at 11/25/16 0538  . levofloxacin (LEVAQUIN) IVPB 500 mg  500 mg Intravenous Q48H Batchelder, Cecilio Asper, RPH      . multivitamin (RENA-VIT) tablet 1 tablet  1 tablet Oral QHS Rise Patience, MD   1 tablet at 11/24/16 2132  . ondansetron (ZOFRAN) tablet 4 mg  4 mg Oral Q6H PRN Rise Patience, MD       Or  . ondansetron [facility]) injection 4 mg  4 mg Intravenous Q6H PRN Rise Patience, MD      . rosuvastatin (CRESTOR) tablet 10 mg  10 mg Oral QHS Rise Patience, MD   10 mg at 11/24/16 2133  . Warfarin - Pharmacist Dosing Inpatient   Does not apply q1800 Rolla Flatten, [facility]  ROS: As per HPI otherwise negative.  Physical Exam: Vitals:   11/24/16 0507 11/24/16 1430 11/24/16 2345 11/25/16 0536  BP: 128/77 107/72 112/79 (!) 131/91  Pulse: 86 83 74 68  Resp: 17 18 17 18   Temp: 99.8 F (37.7 C) 98.7 F (37.1 C) 98.8 F (37.1 C) 98.5 F (36.9 C)  TempSrc: Oral Oral  Oral  SpO2: 99% 99% 100% 99%  Weight:      Height:  5\' 2"  (1.575 m)       General: WDWN young female NAD  Head: NCAT sclera not icteric MMM Facial swelling/edema  Neck: Supple. No JVD No masses Lungs: .Breathing is unlabored. Lungs diminished at bases bilat Heart: RRR with S1 S2 Abdomen: soft NT + BS Lower extremities:without edema or ischemic changes, no open wounds  Neuro: A & O  X 3. Moves all extremities spontaneously. Psych:  Responds to questions appropriately with a normal affect. Dialysis Access: R IJ [facility]   Labs: Basic Metabolic Panel:  Recent Labs Lab 11/23/16 1052 11/24/16 1047  NA 135 134*  K 4.2 4.7  CL 96* 96*  CO2 24 23  GLUCOSE 72 78  BUN 24* 40*  CREATININE 6.97* 10.61*  CALCIUM 9.6 8.5*   Liver Function Tests:  Recent Labs Lab 11/23/16 1052 11/24/16 1047  AST 52* 28  ALT 28 22  ALKPHOS 65 60  BILITOT 0.8 0.6  PROT 8.3* 7.1  ALBUMIN 4.6 3.8   No results for input(s): LIPASE,  AMYLASE in the last 168 hours. No results for input(s): AMMONIA in the last 168 hours. CBC:  Recent Labs Lab 11/23/16 1146 11/24/16 1047  WBC 3.0* 3.1*  NEUTROABS 2.3 2.3  HGB 10.0* 8.9*  HCT 31.3* 28.9*  MCV 97.8 99.7  PLT REPEATED TO VERIFY 111*   Cardiac Enzymes:  Recent Labs Lab 11/23/16 2054 11/24/16 0502 11/24/16 1047  TROPONINI <0.03 <0.03 <0.03   CBG: No results for input(s): GLUCAP in the last 168 hours. Iron Studies: No results for input(s): IRON, TIBC, TRANSFERRIN, FERRITIN in the last 72 hours. Studies/Results: Ct Angio Chest Pe W Or Wo Contrast  Result Date: 11/24/2016 CLINICAL DATA:  Acute onset of generalized chest pain. Initial encounter. EXAM: CT ANGIOGRAPHY CHEST WITH CONTRAST TECHNIQUE: Multidetector CT imaging of the chest was performed using the standard protocol during bolus administration of intravenous contrast. Multiplanar CT image reconstructions and MIPs were obtained to evaluate the vascular anatomy. CONTRAST:  68 mL of Isovue 370 IV contrast COMPARISON:  Chest radiograph performed 11/23/2016 FINDINGS: Cardiovascular:  There is no evidence of pulmonary embolus. The heart is normal in size. 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 There is resultant enhancement of diffuse collaterals about the mediastinum and along the diaphragm. There is enhancement of scattered collateral vasculature about the left side of the neck. The thoracic aorta is grossly unremarkable in appearance. The great vessels are unremarkable, though difficult to fully assess due to the phase of contrast enhancement. Mediastinum/Nodes: Trace pericardial fluid remains within normal limits. No mediastinal lymphadenopathy is seen, though the mediastinal soft tissues are difficult to fully characterize due to mild underlying soft tissue edema. The visualized portions of the thyroid gland are  unremarkable. No axillary lymphadenopathy is appreciated. Lungs/Pleura: A trace right-sided pleural effusion appears chronic in nature. Minimal hazy airspace opacities within the upper and lower lobes bilaterally raises concern for mild multifocal pneumonia. No pneumothorax is seen. No dominant mass is identified.  Upper Abdomen: The visualized portions of the liver and spleen are unremarkable in appearance. Musculoskeletal: No acute osseous abnormalities are identified. The visualized musculature is unremarkable in appearance. Review of the MIP images confirms the above findings. IMPRESSION: 1. No evidence of pulmonary embolus. 2. Trace right-sided pleural effusion appears chronic in nature. Minimal hazy bilateral airspace opacities at the upper and lower lung lobes raises concern for mild multifocal pneumonia. Would correlate with the patient's symptoms. 3. 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 Resultant enhancement of diffuse collaterals about the mediastinum and along the diaphragm. Enhancement of scattered collateral vasculature about the left side of the neck. 4. Mediastinum difficult to fully assess due to mild underlying diffuse soft tissue edema. Electronically Signed   By: Garald Balding M.D.   On: 11/24/2016 01:05    Dialysis Orders:  [facility] MWF 3h 180F BFR 350/800 2K/3Ca EDW 49 kg Heparin 3000 U  No VDRA NO ESA   Assessment/Plan: 1.  Multifocal PNA - seen on chest CT on IV Levaquin/Influenza neg blood cultures pending/ - per primary 2.  ESRD -  MWF - For HD today on schedule  3.  Hypertension/volume  - BP controlled cont home meds/volume stable  4.  Anemia  - hgb 8.9 (Ferrlecit allergy noted)- dose 60 Aranesp with HD today  5.  Metabolic bone disease -  Per outpt notes s/p PTectomy Ca 8.5 / Cont Ca acetate binder 6.  Nutrition - Renal diet/vitamins  7. H/o PE on Coumadin - dosing per pharmacy   8. 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  Therefore she is using a TDC in the [place].  If BC pos for staph I would remove TDC and replace after cath holiday. Haniya Fern C

## 2016-11-25 NOTE — Progress Notes (Signed)
ANTICOAGULATION CONSULT NOTE - FOLLOW UP  Pharmacy Consult:  Coumadin Indication: History of PE/DVT  Allergies  Allergen Reactions  . Cefazolin Anaphylaxis  . Ferrlecit [Na Ferric Gluc Cplx In Sucrose] Anaphylaxis    Anaphylactic shock/severe angioedema on 08/09/15 related to IV iron Ferrlecit IV)  required intubation for respiratory failure  . Cyclosporine Other (See Comments)    Loss of motor skills  . Lactose Intolerance (Gi) Nausea Only    Stomach pains also  . Latex Rash  . Vancomycin Rash    Patient Measurements: Height: 5\' 2"  (157.5 cm) Weight: 108 lb (49 kg) IBW/kg (Calculated) : 50.1   Vital Signs: Temp: 98.5 F (36.9 C) (05/30 0536) Temp Source: Oral (05/30 0536) BP: 131/91 (05/30 0536) Pulse Rate: 68 (05/30 0536)  Labs:  Recent Labs  11/23/16 1052 11/23/16 1146 11/23/16 2054 11/24/16 0502 11/24/16 0742 11/24/16 1047 11/25/16 1147  HGB  --  10.0*  --   --   --  8.9*  --   HCT  --  31.3*  --   --   --  28.9*  --   PLT  --  REPEATED TO VERIFY  --   --   --  111*  --   LABPROT  --  32.7*  --   --  32.4*  --  31.5*  INR  --  3.11  --   --  3.08  --  2.96  CREATININE 6.97*  --   --   --   --  10.61*  --   TROPONINI  --   --  <0.03 <0.03  --  <0.03  --     Estimated Creatinine Clearance: 6.2 mL/min (A) (by C-G formula based on SCr of 10.61 mg/dL (H)).    Assessment: 38 YOF presented on 11/23/16 with fever, cough, congestion, and sore throat. The patient was on warfarin PTA for history of PE/DVT.    Coumadin dose was reduced given slightly supra-therapeutic INR and initiation of Levaquin that could increase the effectiveness of Coumadin.  INR decreased slightly to therapeutic level; no bleeding reported.  Home Coumadin dose:  8.5mg  PO daily   Goal of Therapy:  INR 2-3    Plan:  - Repeat Coumadin 4mg  PO today - Daily PT / INR    Oscar Hank D. Mina Marble, PharmD, [place] Pager:  [phone] 11/25/2016, 1:24 PM

## 2016-11-25 NOTE — Progress Notes (Signed)
Rn called Dialysis department to see when patient is scheduled for dialysis tonight and they are unable to give this rn specific time and was told that it will be after midnight. Made pt aware. WIll continue to monitor

## 2016-11-25 NOTE — Progress Notes (Signed)
PROGRESS NOTE    Crystal Dunlap  UMP:[id number] DOB: 1991-03-09 DOA: 11/23/2016 PCP: Pediatrics, [facility]   Brief Narrative: Crystal Dunlap is a 26 y.o. female with history of ESRD on hemodialysis, history of DVT, hypertension presents to the ER with complaints of fever and chills.   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 Code Status: Full code.  Family Communication: Discussed with patient.  Disposition Plan: Home.   Consultants:   Nephrology.    Procedures: none.    Antimicrobials: levaquin    Subjective: BREATHING BETTER, still coughing. Not back to baseline.   Objective: Vitals:   11/24/16 0507 11/24/16 1430 11/24/16 2345 11/25/16 0536  BP: 128/77 107/72 112/79 (!) 131/91  Pulse: 86 83 74 68  Resp: 17 18 17 18   Temp: 99.8 F (37.7 C) 98.7 F (37.1 C) 98.8 F (37.1 C) 98.5 F (36.9 C)  TempSrc: Oral Oral  Oral  SpO2: 99% 99% 100% 99%  Weight:      Height:  5\' 2"  (1.575 m)      Intake/Output Summary (Last 24 hours) at 11/25/16 1437 Last data filed at 11/25/16 0900  Gross per 24 hour  Intake              100 ml  Output                0 ml  Net              100 ml   Filed Weights   11/23/16 1009  Weight: 49 kg (108 lb)    Examination:  General exam: chronically ill female, no distress, resting in bed Respiratory system: decreased BS at bases, few ronchi noted Cardiovascular system: S1 & S2 heard, RRR. No JVD, murmurs, Gastrointestinal system: Abdomen is nondistended, soft and nontender.Normal bowel sounds  heard. Central nervous system: Alert and oriented. No focal neurological deficits. Extremities: Symmetric 5 x 5 power. Skin: No rashes, lesions or ulcers Psychiatry: Judgement and insight appear normal. Mood & affect appropriate.     Data Reviewed: I have personally reviewed following labs and imaging studies  CBC:  Recent Labs Lab 11/23/16 1146 11/24/16 1047  WBC 3.0* 3.1*  NEUTROABS 2.3 2.3  HGB 10.0* 8.9*  HCT 31.3* 28.9*  MCV 97.8 99.7  PLT REPEATED TO VERIFY 400*   Basic Metabolic Panel:  Recent Labs Lab 11/23/16 1052 11/24/16 1047  NA 135 134*  K 4.2 4.7  CL 96* 96*  CO2 24 23  GLUCOSE 72 78  BUN 24* 40*  CREATININE 6.97* 10.61*  CALCIUM 9.6 8.5*   GFR: Estimated Creatinine Clearance: 6.2 mL/min (A) (by C-G formula based on SCr of 10.61 mg/dL (H)). Liver Function Tests:  Recent Labs Lab 11/23/16 1052 11/24/16 1047  AST 52* 28  ALT 28 22  ALKPHOS 65 60  BILITOT 0.8 0.6  PROT 8.3* 7.1  ALBUMIN 4.6 3.8   No results for input(s): LIPASE, AMYLASE in the last 168 hours. No results for input(s): AMMONIA in the last 168 hours. Coagulation Profile:  Recent Labs Lab 11/23/16 1146 11/24/16 0742 11/25/16 1147  INR 3.11 3.08 2.96   Cardiac Enzymes:  Recent Labs Lab 11/23/16 2054 11/24/16 0502 11/24/16 1047  TROPONINI <0.03 <0.03 <0.03   BNP (last 3 results) No results for input(s): PROBNP in the last 8760 hours. HbA1C: No results for input(s): HGBA1C in the last 72 hours. CBG: No results for input(s): GLUCAP in the last 168 hours. Lipid Profile: No results for input(s): CHOL, HDL, LDLCALC, TRIG, CHOLHDL, LDLDIRECT in the last 72 hours. Thyroid Function Tests: No results for input(s): TSH, T4TOTAL, FREET4, T3FREE, THYROIDAB in the last 72 hours. Anemia Panel: No results for input(s): VITAMINB12, FOLATE, FERRITIN, TIBC, IRON, RETICCTPCT in the last 72 hours. Sepsis Labs:  Recent Labs Lab 11/23/16 1106 11/23/16 1628 11/23/16 2054  11/25/16 1147  PROCALCITON  --   --  1.72 2.98  LATICACIDVEN 2.05* 0.81 0.7  --     Recent Results (from the past 240 hour(s))  Rapid strep screen     Status: None   Collection Time: 11/23/16 10:11 AM  Result Value Ref Range Status   Streptococcus, Group A Screen (Direct) NEGATIVE NEGATIVE Final    Comment: (NOTE) A Rapid Antigen test may result negative if the antigen level in the sample is below the detection level of this test. The FDA has not cleared this test as a stand-alone test therefore the rapid antigen negative result has reflexed to a Group A Strep culture.   Culture, group A strep     Status: None (Preliminary result)   Collection Time: 11/23/16 10:11 AM  Result Value Ref Range Status   Specimen Description THROAT  Final   Special Requests NONE Reflexed from [id number]  Final   Culture   Final    CULTURE REINCUBATED FOR BETTER GROWTH Performed at [facility], [address], [place], [place] [address]    Report Status PENDING  Incomplete  Respiratory Panel by PCR     Status: Abnormal   Collection Time: 11/23/16  8:02 PM  Result Value Ref Range Status   Adenovirus NOT DETECTED NOT DETECTED Final   Coronavirus 229E NOT DETECTED NOT DETECTED Final   Coronavirus HKU1 NOT DETECTED NOT DETECTED Final   Coronavirus NL63 NOT DETECTED NOT DETECTED Final   Coronavirus OC43 NOT DETECTED NOT DETECTED Final   Metapneumovirus NOT DETECTED NOT DETECTED Final   Rhinovirus / Enterovirus DETECTED (A) NOT DETECTED Final   Influenza A NOT DETECTED NOT DETECTED Final   Influenza B NOT DETECTED NOT DETECTED Final   Parainfluenza Virus 1 NOT DETECTED NOT DETECTED Final   Parainfluenza Virus 2 NOT DETECTED NOT DETECTED Final   Parainfluenza Virus 3 NOT DETECTED NOT DETECTED Final   Parainfluenza Virus 4 NOT DETECTED NOT DETECTED Final   Respiratory Syncytial Virus NOT DETECTED NOT DETECTED Final   Bordetella pertussis NOT DETECTED NOT DETECTED Final   Chlamydophila pneumoniae NOT  DETECTED NOT DETECTED Final   Mycoplasma pneumoniae NOT DETECTED NOT DETECTED Final  Culture, blood (routine x 2)     Status: None (Preliminary result)   Collection Time: 11/23/16  8:54 PM  Result Value Ref Range Status   Specimen Description BLOOD LEFT ANTECUBITAL  Final   Special Requests IN PEDIATRIC BOTTLE Blood Culture adequate volume  Final   Culture NO GROWTH 2 DAYS  Final   Report Status PENDING  Incomplete  Culture, blood (routine x 2)     Status: None (Preliminary result)   Collection Time: 11/23/16  8:54 PM  Result Value Ref Range Status   Specimen Description  BLOOD LEFT ANTECUBITAL  Final   Special Requests IN PEDIATRIC BOTTLE Blood Culture adequate volume  Final   Culture NO GROWTH 2 DAYS  Final   Report Status PENDING  Incomplete  MRSA PCR Screening     Status: None   Collection Time: 11/23/16 10:20 PM  Result Value Ref Range Status   MRSA by PCR NEGATIVE NEGATIVE Final    Comment:        The GeneXpert MRSA Assay (FDA approved for NASAL specimens only), is one component of a comprehensive MRSA colonization surveillance program. It is not intended to diagnose MRSA infection nor to guide or monitor treatment for MRSA infections.          Radiology Studies: Ct Angio Chest Pe W Or Wo Contrast  Result Date: 11/24/2016 CLINICAL DATA:  Acute onset of generalized chest pain. Initial encounter. EXAM: CT ANGIOGRAPHY CHEST WITH CONTRAST TECHNIQUE: Multidetector CT imaging of the chest was performed using the standard protocol during bolus administration of intravenous contrast. Multiplanar CT image reconstructions and MIPs were obtained to evaluate the vascular anatomy. CONTRAST:  68 mL of Isovue 370 IV contrast COMPARISON:  Chest radiograph performed 11/23/2016 FINDINGS: Cardiovascular:  There is no evidence of pulmonary embolus. The heart is normal in size. 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 There is resultant enhancement of diffuse collaterals about the mediastinum and along the diaphragm. There is enhancement of scattered collateral vasculature about the left side of the neck. The thoracic aorta is grossly unremarkable in appearance. The great vessels are unremarkable, though difficult to fully assess due to the phase of contrast enhancement. Mediastinum/Nodes: Trace pericardial fluid remains within normal limits. No mediastinal lymphadenopathy is seen, though the mediastinal soft tissues are difficult to fully characterize due to mild underlying soft tissue edema. The visualized portions of the thyroid gland are unremarkable. No axillary lymphadenopathy is appreciated. Lungs/Pleura: A trace right-sided pleural effusion appears chronic in nature. Minimal hazy airspace opacities within the upper and lower lobes bilaterally raises concern for mild multifocal pneumonia. No pneumothorax is seen. No dominant mass is identified. Upper Abdomen: The visualized portions of the liver and spleen are unremarkable in appearance. Musculoskeletal: No acute osseous abnormalities are identified. The visualized musculature is unremarkable in appearance. Review of the MIP images confirms the above findings. IMPRESSION: 1. No evidence of pulmonary embolus. 2. Trace right-sided pleural effusion appears chronic in nature. Minimal hazy bilateral airspace opacities at the upper and lower lung lobes raises concern for mild multifocal pneumonia. Would correlate with the patient's symptoms. 3. 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 Resultant enhancement of diffuse collaterals about the mediastinum and along the diaphragm. Enhancement of scattered collateral vasculature about the left side of the neck. 4. Mediastinum difficult to fully assess due to mild underlying diffuse soft tissue edema. Electronically Signed    By: Garald Balding M.D.   On: 11/24/2016 01:05        Scheduled Meds: . amLODipine  10 mg Oral Daily  . calcium acetate  1,334 mg Oral TID WC  . darbepoetin (ARANESP) injection - DIALYSIS  60 mcg Intravenous Q Wed-HD  . famotidine  20 mg Oral Daily  . hydrALAZINE  25 mg Oral Q8H  . multivitamin  1 tablet Oral QHS  . rosuvastatin  10 mg Oral QHS  . warfarin  4 mg Oral ONCE-1800  .  Warfarin - Pharmacist Dosing Inpatient   Does not apply q1800   Continuous Infusions: . levofloxacin (LEVAQUIN) IV       LOS: 1 day    Time spent: 25 MINUTES.    Domenic Polite, MD [facility] Pager [phone]   If 7PM-7AM, please contact night-coverage [email or web address] Password [facility] 11/25/2016, 2:37 PM

## 2016-11-25 NOTE — Care Management Note (Signed)
Case Management Note  Patient Details  Name: Crystal Dunlap MRN: [id number] Date of Birth: Jan 01, 1991  Subjective/Objective:                    Action/Plan:  From home with parents , will continue to follow Expected Discharge Date:                  Expected Discharge Plan:  Home/Self Care  In-House Referral:     Discharge planning Services     Post Acute Care Choice:    Choice offered to:     DME Arranged:    DME Agency:     HH Arranged:    [place] Agency:     Status of Service:  In process, will continue to follow  If discussed at Long Length of Stay Meetings, dates discussed:    Additional Comments:  Marilu Favre, RN 11/25/2016, 11:56 AM

## 2016-11-25 NOTE — Progress Notes (Signed)
Rn has called hemodilaysis several times during day shift to see when patient is scheduled for dialysis. RN notified by HD that patient is scheduled for dialysis today but they do not know when yet. RN notified night shift nurse of this. Patient and patient's mother also aware.

## 2016-11-26 DIAGNOSIS — A419 Sepsis, unspecified organism: Principal | ICD-10-CM

## 2016-11-26 LAB — RENAL FUNCTION PANEL
ALBUMIN: 3.6 g/dL (ref 3.5–5.0)
ANION GAP: 17 — AB (ref 5–15)
BUN: 73 mg/dL — ABNORMAL HIGH (ref 6–20)
CO2: 22 mmol/L (ref 22–32)
CREATININE: 15.22 mg/dL — AB (ref 0.44–1.00)
Calcium: 8.2 mg/dL — ABNORMAL LOW (ref 8.9–10.3)
Chloride: 95 mmol/L — ABNORMAL LOW (ref 101–111)
GFR, EST AFRICAN AMERICAN: 3 mL/min — AB (ref >60)
GFR, EST NON AFRICAN AMERICAN: 3 mL/min — AB (ref >60)
Glucose, Bld: 71 mg/dL (ref 65–99)
PHOSPHORUS: 5.6 mg/dL — AB (ref 2.5–4.6)
Potassium: 4.9 mmol/L (ref 3.5–5.1)
SODIUM: 134 mmol/L — AB (ref 135–145)

## 2016-11-26 LAB — PROTIME-INR
INR: 2.8
PROTHROMBIN TIME: 30.1 s — AB (ref 11.4–15.2)

## 2016-11-26 LAB — CBC
HCT: 27.8 % — ABNORMAL LOW (ref 36.0–46.0)
HEMOGLOBIN: 8.8 g/dL — AB (ref 12.0–15.0)
MCH: 30.2 pg (ref 26.0–34.0)
MCHC: 31.7 g/dL (ref 30.0–36.0)
MCV: 95.5 fL (ref 78.0–100.0)
PLATELETS: 107 10*3/uL — AB (ref 150–400)
RBC: 2.91 MIL/uL — AB (ref 3.87–5.11)
RDW: 16.3 % — ABNORMAL HIGH (ref 11.5–15.5)
WBC: 2.5 10*3/uL — AB (ref 4.0–10.5)

## 2016-11-26 LAB — HEPATITIS B SURFACE ANTIGEN: HEP B S AG: NEGATIVE

## 2016-11-26 MED ORDER — LIDOCAINE HCL (PF) 1 % IJ SOLN: 5.0000 mL | INTRAMUSCULAR | Status: DC | PRN

## 2016-11-26 MED ORDER — PENTAFLUOROPROP-TETRAFLUOROETH EX AERO: 1.0000 "application " | INHALATION_SPRAY | CUTANEOUS | Status: DC | PRN

## 2016-11-26 MED ORDER — ALTEPLASE 2 MG IJ SOLR: 2.0000 mg | Freq: Once | INTRAMUSCULAR | Status: DC | PRN

## 2016-11-26 MED ORDER — HEPARIN SODIUM (PORCINE) 1000 UNIT/ML DIALYSIS: 20.0000 [IU]/kg | INTRAMUSCULAR | Status: DC | PRN

## 2016-11-26 MED ORDER — LEVOFLOXACIN 500 MG PO TABS
500.0000 mg | ORAL_TABLET | ORAL | Status: DC
  Administered 2016-11-27: 500 mg via ORAL
  Filled 2016-11-26: qty 1

## 2016-11-26 MED ORDER — SODIUM CHLORIDE 0.9 % IV SOLN: 100.0000 mL | INTRAVENOUS | Status: DC | PRN

## 2016-11-26 MED ORDER — DARBEPOETIN ALFA 60 MCG/0.3ML IJ SOSY
PREFILLED_SYRINGE | INTRAMUSCULAR | Status: AC
  Administered 2016-11-26: 60 ug via INTRAVENOUS
  Filled 2016-11-26: qty 0.3

## 2016-11-26 MED ORDER — DARBEPOETIN ALFA 60 MCG/0.3ML IJ SOSY
60.0000 ug | PREFILLED_SYRINGE | INTRAMUSCULAR | Status: DC
  Administered 2016-11-27: 60 ug via INTRAVENOUS

## 2016-11-26 MED ORDER — HEPARIN SODIUM (PORCINE) 1000 UNIT/ML DIALYSIS
1000.0000 [IU] | INTRAMUSCULAR | Status: DC | PRN
Start: 2016-11-26 — End: 2016-11-26

## 2016-11-26 MED ORDER — WARFARIN SODIUM 5 MG PO TABS
5.0000 mg | ORAL_TABLET | Freq: Once | ORAL | Status: AC
Start: 2016-11-26 — End: 2016-11-26
  Administered 2016-11-26: 5 mg via ORAL
  Filled 2016-11-26: qty 1

## 2016-11-26 MED ORDER — FENTANYL CITRATE (PF) 100 MCG/2ML IJ SOLN
INTRAMUSCULAR | Status: AC
  Administered 2016-11-26: 12.5 ug via INTRAVENOUS
  Filled 2016-11-26: qty 2

## 2016-11-26 MED ORDER — LIDOCAINE-PRILOCAINE 2.5-2.5 % EX CREA: 1.0000 "application " | TOPICAL_CREAM | CUTANEOUS | Status: DC | PRN

## 2016-11-26 MED ORDER — SODIUM CHLORIDE 0.9 % IV SOLN
100.0000 mL | INTRAVENOUS | Status: DC | PRN
Start: 2016-11-26 — End: 2016-11-26

## 2016-11-26 NOTE — Progress Notes (Signed)
Patient arrived to unit by bed.  Reviewed treatment plan and this RN agrees with plan.  Report received from bedside RN, Reginold Agent.  Consent obtained.  Patient A & O X 4.   Lung sounds diminished to ausculation in all fields. Moderate generalized with facial edema. Cardiac:  NSr.  Removed caps and cleansed LIJ catheter with chlorhedxidine.  Aspirated ports of heparin and flushed them with saline per protocol.  Connected and secured lines, initiated treatment at [place].  UF Goal of 2200 mL and net fluid removal 1.7 L.  Will continue to monitor.

## 2016-11-26 NOTE — Progress Notes (Signed)
ANTICOAGULATION & ANTIBIOTIC CONSULT NOTE - FOLLOW UP  Pharmacy Consult:  Coumadin + Levaquin Indication: History of PE/DVT + PNA  Allergies  Allergen Reactions  . Cefazolin Anaphylaxis  . Ferrlecit [Na Ferric Gluc Cplx In Sucrose] Anaphylaxis    Anaphylactic shock/severe angioedema on 08/09/15 related to IV iron Ferrlecit IV)  required intubation for respiratory failure  . Cyclosporine Other (See Comments)    Loss of motor skills  . Lactose Intolerance (Gi) Nausea Only    Stomach pains also  . Latex Rash  . Vancomycin Rash    Patient Measurements: Height: 5\' 2"  (157.5 cm) Weight: 108 lb 0.4 oz (49 kg) IBW/kg (Calculated) : 50.1   Vital Signs: Temp: 98.2 F (36.8 C) (05/31 0627) Temp Source: Oral (05/31 0627) BP: 118/88 (05/31 0627) Pulse Rate: 80 (05/31 0627)  Labs:  Recent Labs  11/23/16 1146 11/23/16 2054 11/24/16 0502 11/24/16 0742 11/24/16 1047 11/25/16 1147 11/26/16 0316 11/26/16 0335  HGB 10.0*  --   --   --  8.9*  --  8.8*  --   HCT 31.3*  --   --   --  28.9*  --  27.8*  --   PLT REPEATED TO VERIFY  --   --   --  111*  --  107*  --   LABPROT 32.7*  --   --  32.4*  --  31.5*  --  30.1*  INR 3.11  --   --  3.08  --  2.96  --  2.80  CREATININE  --   --   --   --  10.61*  --  15.22*  --   TROPONINI  --  <0.03 <0.03  --  <0.03  --   --   --     Estimated Creatinine Clearance: 4.3 mL/min (A) (by C-G formula based on SCr of 15.22 mg/dL (H)).    Assessment: 20 YOF presented on 11/23/16 with fever, cough, congestion, and sore throat. The patient was on warfarin PTA for history of PE/DVT.    Coumadin dose was reduced given slightly supra-therapeutic INR on admit and initiation of Levaquin that could increase the effectiveness of Coumadin.  INR trending down further but remaining therapeutic; no bleeding reported.  Home Coumadin dose:  8.5mg  PO daily   Pharmacy also managing Levaquin for PNA.  Patient is now afebrile, WBC remains now and she is table on room  air.  She continues on dialysis.   Goal of Therapy:  INR 2-3    Plan:  - Coumadin 5mg  PO today - Daily PT / INR  - Continue LVQ 500mg  Q48H, change to PO - Pharmacy will sign off as dosage adjustment is unnecessary.  Thank you for the consult!  Recommend stopping after 8 days of therapy.    Katelyn Broadnax D. Mina Marble, PharmD, BCPS Pager:  [phone] 11/26/2016, 11:17 AM

## 2016-11-26 NOTE — Progress Notes (Signed)
Dialysis treatment completed.  2200 mL ultrafiltrated.  1700 mL net fluid removal.  Patient status unchanged. Lung sounds diminished to ausculation in all fields. Moderate facial edema. Cardiac: NSR.  Cleansed LIJ catheter with chlorhexidine.  Disconnected lines and flushed ports with saline per protocol.  Ports locked with heparin and capped per protocol.    Report given to bedside, RN Reginold Agent.

## 2016-11-26 NOTE — Progress Notes (Signed)
PROGRESS NOTE    [name]  HYW:[id number] DOB: 04/26/1991 DOA: 11/23/2016 PCP: Pediatrics, [facility]   Brief Narrative: Crystal Dunlap is a 26 y.o. female with history of ESRD on hemodialysis, history of DVT, hypertension presented to the ER with complaints of fever and chills.   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 Code Status: Full code.  Family Communication: Discussed with patient.  Disposition Plan: Home.   Consultants:   Nephrology.    Procedures: none.    Antimicrobials: levaquin    Subjective: Still having pleuritic chest pain, upset tearful  Objective: Vitals:   11/26/16 0500 11/26/16 0525 11/26/16 0529 11/26/16 0627  BP: 107/65 116/64 (!) 143/100 118/88  Pulse: 72 84 78 80  Resp:  14  14  Temp:  98.4 F (36.9 C)  98.2 F (36.8 C)  TempSrc:    Oral  SpO2:    100%  Weight:  49 kg (108 lb 0.4 oz)    Height:        Intake/Output Summary (Last 24 hours) at 11/26/16 1125 Last data filed at 11/26/16 4854  Gross per 24 hour  Intake              360 ml  Output             1700 ml  Net            -1340 ml   Filed Weights   11/23/16 1009 11/26/16 0218 11/26/16 0525  Weight: 49 kg (108 lb) 50.7 kg (111 lb 12.4 oz) 49 kg (108 lb 0.4 oz)    Examination:  General exam:young, chronically ill female, uncomfortable Respiratory system: scattered rales at bases, poor air movement Cardiovascular system: S1 & S2 heard, RRR.  No JVD, murmurs, Gastrointestinal system: Abdomen is nondistended, soft and nontender.Normal bowel sounds heard. Central nervous system: Alert and oriented. No focal neurological deficits. Extremities: Symmetric 5 x 5 power. Skin: No rashes, lesions or ulcers Psychiatry: Judgement and insight appear normal. Mood & affect appropriate.     Data Reviewed: I have personally reviewed following labs and imaging studies  CBC:  Recent Labs Lab 11/23/16 1146 11/24/16 1047 11/26/16 0316  WBC 3.0* 3.1* 2.5*  NEUTROABS 2.3 2.3  --   HGB 10.0* 8.9* 8.8*  HCT 31.3* 28.9* 27.8*  MCV 97.8 99.7 95.5  PLT REPEATED TO VERIFY 111* 627*   Basic Metabolic Panel:  Recent Labs Lab 11/23/16 1052 11/24/16 1047 11/26/16 0316  NA 135 134* 134*  K 4.2 4.7 4.9  CL 96* 96* 95*  CO2 24 23 22   GLUCOSE 72 78 71  BUN 24* 40* 73*  CREATININE 6.97* 10.61* 15.22*  CALCIUM 9.6 8.5* 8.2*  PHOS  --   --  5.6*   GFR: Estimated Creatinine Clearance: 4.3 mL/min (A) (by C-G formula based on SCr of 15.22 mg/dL (H)). Liver Function Tests:  Recent Labs Lab 11/23/16 1052 11/24/16 1047 11/26/16 0316  AST 52* 28  --   ALT 28 22  --  ALKPHOS 65 60  --   BILITOT 0.8 0.6  --   PROT 8.3* 7.1  --   ALBUMIN 4.6 3.8 3.6   No results for input(s): LIPASE, AMYLASE in the last 168 hours. No results for input(s): AMMONIA in the last 168 hours. Coagulation Profile:  Recent Labs Lab 11/23/16 1146 11/24/16 0742 11/25/16 1147 11/26/16 0335  INR 3.11 3.08 2.96 2.80   Cardiac Enzymes:  Recent Labs Lab 11/23/16 2054 11/24/16 0502 11/24/16 1047  TROPONINI <0.03 <0.03 <0.03   BNP (last 3 results) No results for input(s): PROBNP in the last 8760 hours. HbA1C: No results for input(s): HGBA1C in the last 72 hours. CBG: No results for input(s): GLUCAP in the last 168 hours. Lipid Profile: No results for input(s): CHOL, HDL, LDLCALC, TRIG, CHOLHDL, LDLDIRECT in the last 72 hours. Thyroid Function  Tests: No results for input(s): TSH, T4TOTAL, FREET4, T3FREE, THYROIDAB in the last 72 hours. Anemia Panel: No results for input(s): VITAMINB12, FOLATE, FERRITIN, TIBC, IRON, RETICCTPCT in the last 72 hours. Sepsis Labs:  Recent Labs Lab 11/23/16 1106 11/23/16 1628 11/23/16 2054 11/25/16 1147  PROCALCITON  --   --  1.72 2.98  LATICACIDVEN 2.05* 0.81 0.7  --     Recent Results (from the past 240 hour(s))  Rapid strep screen     Status: None   Collection Time: 11/23/16 10:11 AM  Result Value Ref Range Status   Streptococcus, Group A Screen (Direct) NEGATIVE NEGATIVE Final    Comment: (NOTE) A Rapid Antigen test may result negative if the antigen level in the sample is below the detection level of this test. The FDA has not cleared this test as a stand-alone test therefore the rapid antigen negative result has reflexed to a Group A Strep culture.   Culture, group A strep     Status: None   Collection Time: 11/23/16 10:11 AM  Result Value Ref Range Status   Specimen Description THROAT  Final   Special Requests NONE Reflexed from [id number]  Final   Culture   Final    NO GROUP A STREP (S.PYOGENES) ISOLATED Performed at [facility], [address] [place], [place] [address]    Report Status 11/25/2016 FINAL  Final  Respiratory Panel by PCR     Status: Abnormal   Collection Time: 11/23/16  8:02 PM  Result Value Ref Range Status   Adenovirus NOT DETECTED NOT DETECTED Final   Coronavirus 229E NOT DETECTED NOT DETECTED Final   Coronavirus HKU1 NOT DETECTED NOT DETECTED Final   Coronavirus NL63 NOT DETECTED NOT DETECTED Final   Coronavirus OC43 NOT DETECTED NOT DETECTED Final   Metapneumovirus NOT DETECTED NOT DETECTED Final   Rhinovirus / Enterovirus DETECTED (A) NOT DETECTED Final   Influenza A NOT DETECTED NOT DETECTED Final   Influenza B NOT DETECTED NOT DETECTED Final   Parainfluenza Virus 1 NOT DETECTED NOT DETECTED Final   Parainfluenza Virus 2 NOT DETECTED NOT  DETECTED Final   Parainfluenza Virus 3 NOT DETECTED NOT DETECTED Final   Parainfluenza Virus 4 NOT DETECTED NOT DETECTED Final   Respiratory Syncytial Virus NOT DETECTED NOT DETECTED Final   Bordetella pertussis NOT DETECTED NOT DETECTED Final   Chlamydophila pneumoniae NOT DETECTED NOT DETECTED Final   Mycoplasma pneumoniae NOT DETECTED NOT DETECTED Final  Culture, blood (routine x 2)     Status: None (Preliminary result)   Collection Time: 11/23/16  8:54 PM  Result Value Ref Range Status   Specimen Description BLOOD LEFT  ANTECUBITAL  Final   Special Requests IN PEDIATRIC BOTTLE Blood Culture adequate volume  Final   Culture NO GROWTH 2 DAYS  Final   Report Status PENDING  Incomplete  Culture, blood (routine x 2)     Status: None (Preliminary result)   Collection Time: 11/23/16  8:54 PM  Result Value Ref Range Status   Specimen Description BLOOD LEFT ANTECUBITAL  Final   Special Requests IN PEDIATRIC BOTTLE Blood Culture adequate volume  Final   Culture NO GROWTH 2 DAYS  Final   Report Status PENDING  Incomplete  MRSA PCR Screening     Status: None   Collection Time: 11/23/16 10:20 PM  Result Value Ref Range Status   MRSA by PCR NEGATIVE NEGATIVE Final    Comment:        The GeneXpert MRSA Assay (FDA approved for NASAL specimens only), is one component of a comprehensive MRSA colonization surveillance program. It is not intended to diagnose MRSA infection nor to guide or monitor treatment for MRSA infections.          Radiology Studies: No results found.      Scheduled Meds: . amLODipine  10 mg Oral Daily  . calcium acetate  1,334 mg Oral TID WC  . [START ON 12/04/2016] darbepoetin (ARANESP) injection - DIALYSIS  60 mcg Intravenous Q Fri-HD  . famotidine  20 mg Oral Daily  . hydrALAZINE  25 mg Oral Q8H  . [START ON 11/27/2016] levofloxacin  500 mg Oral Q48H  . multivitamin  1 tablet Oral QHS  . rosuvastatin  10 mg Oral QHS  . warfarin  5 mg Oral ONCE-1800  .  Warfarin - Pharmacist Dosing Inpatient   Does not apply q1800   Continuous Infusions:    LOS: 2 days    Time spent: 25 MINUTES.    Domenic Polite, MD [facility] Pager [phone]   If 7PM-7AM, please contact night-coverage [email or web address] Password [email or web address] 11/26/2016, 11:25 AM

## 2016-11-26 NOTE — Progress Notes (Signed)
Crystal Dunlap Progress Note  Background: 26 y.o. female with ESRD secondary to FSGS, HTN, h/o PE on Coumadin, h/o two failed renal transplants. 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 Recent transfer to [facility] in May 2018.  Presented to ED on Monday 5/28  after dialysis with fever and chills and chest pain. Temp 101.63F noted at dialysis center. Febrile and leukopenic WBC 3.0 on admission. CT chest angio neg for PE but showed multifocal pneumonia. Admitted for further management. Now on IV Levaquin. History of multiple failed access. Dialyzes via L IJ TDC  Dialysis Orders:  HP MWF 3h 180F BFR 350/800 2K/3Ca EDW 49 kg Heparin 3000 U  No VDRA NO ESA   Assessment/Plan: 1.  Multifocal PNA - seen on chest CT on IV Levaquin/Influenza neg blood cultures ngtd - per primary 2.  ESRD -  MWF -Cont on schedule  3.  Hypertension/volume  - BP controlled cont home meds/volume stable  4.  Anemia  - hgb 8.9 (Ferrlecit allergy noted)- started Aranesp 60 with HD 5/30 5.  Metabolic bone disease -  Per outpt notes s/p PTectomy Ca 8.5 / Cont Ca acetate binder 6.  Nutrition - Renal diet/vitamins  7. H/o PE on Coumadin - dosing per pharmacy  8. 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  Some nausea today.  No change in chest pain. Feels achy pain, worse with eating, coughing.   Objective Vitals:   11/26/16 0500 11/26/16 0525 11/26/16 0529 11/26/16 0627  BP: 107/65 116/64 (!) 143/100 118/88  Pulse: 72 84 78 80  Resp:  14  14  Temp:  98.4 F (36.9 C)  98.2 F (36.8 C)  TempSrc:    Oral  SpO2:    100%  Weight:  49 kg (108 lb 0.4 oz)    Height:       Physical Exam General: WDWN young female some facial edema NAD  Lungs: .Breathing is unlabored. Lungs diminished at bases bilat Heart: RRR with S1 S2 Abdomen: soft NT + BS Lower extremities:without edema or ischemic changes, no open wounds  Dialysis Access: L IJ  TDC dsg intact   Additional Objective Labs: Basic Metabolic Panel:  Recent Labs Lab 11/23/16 1052 11/24/16 1047 11/26/16 0316  NA 135 134* 134*  K 4.2 4.7 4.9  CL 96* 96* 95*  CO2 24 23 22   GLUCOSE 72 78 71  BUN 24* 40* 73*  CREATININE 6.97* 10.61* 15.22*  CALCIUM 9.6 8.5* 8.2*  PHOS  --   --  5.6*   Liver Function Tests:  Recent Labs Lab 11/23/16 1052 11/24/16 1047 11/26/16 0316  AST 52* 28  --   ALT 28 22  --   ALKPHOS 65 60  --   BILITOT 0.8 0.6  --   PROT 8.3* 7.1  --   ALBUMIN 4.6 3.8 3.6   No results for input(s): LIPASE, AMYLASE in the last 168 hours. CBC:  Recent Labs Lab 11/23/16 1146 11/24/16 1047 11/26/16 0316  WBC 3.0* 3.1* 2.5*  NEUTROABS 2.3 2.3  --   HGB 10.0* 8.9* 8.8*  HCT 31.3* 28.9* 27.8*  MCV 97.8 99.7 95.5  PLT REPEATED TO VERIFY 111* 107*   Blood Culture    Component Value Date/Time   SDES BLOOD LEFT ANTECUBITAL 11/23/2016 2054   SDES BLOOD LEFT ANTECUBITAL 11/23/2016 2054   SPECREQUEST IN PEDIATRIC BOTTLE Blood Culture adequate volume 11/23/2016 2054   SPECREQUEST IN PEDIATRIC BOTTLE  Blood Culture adequate volume 11/23/2016 2054   CULT NO GROWTH 2 DAYS 11/23/2016 2054   CULT NO GROWTH 2 DAYS 11/23/2016 2054   REPTSTATUS PENDING 11/23/2016 2054   REPTSTATUS PENDING 11/23/2016 2054    Cardiac Enzymes:  Recent Labs Lab 11/23/16 2054 11/24/16 0502 11/24/16 1047  TROPONINI <0.03 <0.03 <0.03   CBG: No results for input(s): GLUCAP in the last 168 hours. Iron Studies: No results for input(s): IRON, TIBC, TRANSFERRIN, FERRITIN in the last 72 hours. Lab Results  Component Value Date   INR 2.80 11/26/2016   INR 2.96 11/25/2016   INR 3.08 11/24/2016   Medications:  . amLODipine  10 mg Oral Daily  . calcium acetate  1,334 mg Oral TID WC  . [START ON 12/04/2016] darbepoetin (ARANESP) injection - DIALYSIS  60 mcg Intravenous Q Fri-HD  . famotidine  20 mg Oral Daily  . hydrALAZINE  25 mg Oral Q8H  . [START ON 11/27/2016]  levofloxacin  500 mg Oral Q48H  . multivitamin  1 tablet Oral QHS  . rosuvastatin  10 mg Oral QHS  . warfarin  5 mg Oral ONCE-1800  . Warfarin - Pharmacist Dosing Inpatient   Does not apply [place] PA-C [facility] Pager [phone] 11/26/2016,11:59 AM  LOS: 2 days   Renal Attending; I agree with note as written above. Dariush Mcnellis C

## 2016-11-27 DIAGNOSIS — J069 Acute upper respiratory infection, unspecified: Secondary | ICD-10-CM

## 2016-11-27 LAB — HEPATITIS B SURFACE ANTIBODY,QUALITATIVE: HEP B S AB: REACTIVE

## 2016-11-27 LAB — RENAL FUNCTION PANEL
Albumin: 3.5 g/dL (ref 3.5–5.0)
Anion gap: 13 (ref 5–15)
BUN: 42 mg/dL — ABNORMAL HIGH (ref 6–20)
CALCIUM: 8 mg/dL — AB (ref 8.9–10.3)
CHLORIDE: 99 mmol/L — AB (ref 101–111)
CO2: 26 mmol/L (ref 22–32)
Creatinine, Ser: 10.23 mg/dL — ABNORMAL HIGH (ref 0.44–1.00)
GFR calc Af Amer: 5 mL/min — ABNORMAL LOW (ref >60)
GFR calc non Af Amer: 5 mL/min — ABNORMAL LOW (ref >60)
GLUCOSE: 96 mg/dL (ref 65–99)
Phosphorus: 3.9 mg/dL (ref 2.5–4.6)
Potassium: 4.1 mmol/L (ref 3.5–5.1)
Sodium: 138 mmol/L (ref 135–145)

## 2016-11-27 LAB — CBC
HCT: 29.4 % — ABNORMAL LOW (ref 36.0–46.0)
HEMOGLOBIN: 9.3 g/dL — AB (ref 12.0–15.0)
MCH: 30.1 pg (ref 26.0–34.0)
MCHC: 31.6 g/dL (ref 30.0–36.0)
MCV: 95.1 fL (ref 78.0–100.0)
Platelets: 122 10*3/uL — ABNORMAL LOW (ref 150–400)
RBC: 3.09 MIL/uL — ABNORMAL LOW (ref 3.87–5.11)
RDW: 16.1 % — ABNORMAL HIGH (ref 11.5–15.5)
WBC: 2.1 10*3/uL — ABNORMAL LOW (ref 4.0–10.5)

## 2016-11-27 LAB — PROCALCITONIN: PROCALCITONIN: 2.77 ng/mL

## 2016-11-27 LAB — PROTIME-INR
INR: 2.9
PROTHROMBIN TIME: 30.9 s — AB (ref 11.4–15.2)

## 2016-11-27 LAB — HEPATITIS B CORE ANTIBODY, TOTAL: Hep B Core Total Ab: NEGATIVE

## 2016-11-27 MED ORDER — DARBEPOETIN ALFA 60 MCG/0.3ML IJ SOSY
PREFILLED_SYRINGE | INTRAMUSCULAR | Status: AC
  Administered 2016-11-27: 60 ug via INTRAVENOUS
  Filled 2016-11-27: qty 0.3

## 2016-11-27 MED ORDER — AMLODIPINE BESYLATE 10 MG PO TABS: 10.0000 mg | ORAL_TABLET | Freq: Every day | ORAL | Status: DC

## 2016-11-27 MED ORDER — SODIUM CHLORIDE 0.9 % IV SOLN
100.0000 mL | INTRAVENOUS | Status: DC | PRN
Start: 2016-11-27 — End: 2016-11-27

## 2016-11-27 MED ORDER — AMLODIPINE BESYLATE 5 MG PO TABS: 5.0000 mg | ORAL_TABLET | Freq: Every day | ORAL | Status: DC

## 2016-11-27 MED ORDER — SODIUM CHLORIDE 0.9 % IV SOLN: 100.0000 mL | INTRAVENOUS | Status: DC | PRN

## 2016-11-27 MED ORDER — LEVOFLOXACIN 500 MG PO TABS: 500.0000 mg | ORAL_TABLET | ORAL | 0 refills | Status: DC

## 2016-11-27 MED ORDER — GUAIFENESIN ER 600 MG PO TB12
600.0000 mg | ORAL_TABLET | Freq: Two times a day (BID) | ORAL | Status: DC
  Administered 2016-11-27 – 2016-11-28 (×3): 600 mg via ORAL
  Filled 2016-11-27 (×3): qty 1

## 2016-11-27 MED ORDER — FENTANYL CITRATE (PF) 100 MCG/2ML IJ SOLN
INTRAMUSCULAR | Status: AC
  Administered 2016-11-27: 12.5 ug via INTRAVENOUS
  Filled 2016-11-27: qty 2

## 2016-11-27 MED ORDER — HEPARIN SODIUM (PORCINE) 1000 UNIT/ML DIALYSIS: 20.0000 [IU]/kg | INTRAMUSCULAR | Status: DC | PRN

## 2016-11-27 MED ORDER — WARFARIN SODIUM 5 MG PO TABS
5.0000 mg | ORAL_TABLET | Freq: Once | ORAL | Status: AC
  Administered 2016-11-27: 5 mg via ORAL
  Filled 2016-11-27: qty 1

## 2016-11-27 MED ORDER — HEPARIN SODIUM (PORCINE) 1000 UNIT/ML DIALYSIS: 1000.0000 [IU] | INTRAMUSCULAR | Status: DC | PRN

## 2016-11-27 MED ORDER — ALTEPLASE 2 MG IJ SOLR: 2.0000 mg | Freq: Once | INTRAMUSCULAR | Status: DC | PRN

## 2016-11-27 NOTE — Care Management Important Message (Signed)
Important Message  Patient Details  Name: Beth Goodlin MRN: [id number] Date of Birth: [birth year]   Medicare Important Message Given:  Yes    Reynard Christoffersen 11/27/2016, 11:24 AM

## 2016-11-27 NOTE — Procedures (Signed)
I was present at this session.  I have reviewed the session itself and made appropriate changes.  HD via cath.  Needs longer Hd.    Crystal Dunlap 6/1/201812:19 PM

## 2016-11-27 NOTE — Progress Notes (Signed)
Patient transported to dialysis unit.

## 2016-11-27 NOTE — Progress Notes (Signed)
ANTICOAGULATION CONSULT NOTE - FOLLOW UP  Pharmacy Consult:  Coumadin Indication: History of PE/DVT   Allergies  Allergen Reactions  . Cefazolin Anaphylaxis  . Ferrlecit [Na Ferric Gluc Cplx In Sucrose] Anaphylaxis    Anaphylactic shock/severe angioedema on 08/09/15 related to IV iron Ferrlecit IV)  required intubation for respiratory failure  . Cyclosporine Other (See Comments)    Loss of motor skills  . Lactose Intolerance (Gi) Nausea Only    Stomach pains also  . Latex Rash  . Vancomycin Rash    Patient Measurements: Height: 5\' 2"  (157.5 cm) Weight: 108 lb 0.4 oz (49 kg) IBW/kg (Calculated) : 50.1   Vital Signs: BP: 113/79 (06/01 0500) Pulse Rate: 87 (06/01 0500)  Labs:  Recent Labs  11/24/16 1047 11/25/16 1147 11/26/16 0316 11/26/16 0335  HGB 8.9*  --  8.8*  --   HCT 28.9*  --  27.8*  --   PLT 111*  --  107*  --   LABPROT  --  31.5*  --  30.1*  INR  --  2.96  --  2.80  CREATININE 10.61*  --  15.22*  --   TROPONINI <0.03  --   --   --     Estimated Creatinine Clearance: 4.3 mL/min (A) (by C-G formula based on SCr of 15.22 mg/dL (H)).  Assessment: 42 YOF presented on 11/23/16 with fever, cough, congestion, and sore throat. The patient was on warfarin PTA for history of PE/DVT.  Recent CT neg. for PE.   Coumadin dose was reduced given slightly supra-therapeutic INR on admit and initiation of Levaquin that could increase the effectiveness of Coumadin.  INR trending down but remaining therapeutic; no bleeding reported.  Home Coumadin dose:  8.5mg  PO daily  Goal of Therapy:  INR 2-3    Plan:  - Coumadin 5mg  PO again today - Daily PT / INR   Rober Minion, PharmD., MS Clinical Pharmacist Pager:  [phone] Thank you for allowing pharmacy to be part of this patients care team. 11/27/2016, 10:06 AM

## 2016-11-27 NOTE — Progress Notes (Addendum)
[facility] Progress Note   Subjective:  Coughing, has productive cough with yellowish sputum. Says she doesn't feel well. NAD.   Objective Vitals:   11/26/16 0529 11/26/16 0627 11/26/16 2049 11/27/16 0500  BP: (!) 143/100 118/88 109/84 113/79  Pulse: 78 80 81 87  Resp:  14    Temp:  98.2 F (36.8 C) 99 F (37.2 C)   TempSrc:  Oral Oral   SpO2:  100% 95%   Weight:      Height:       Physical Exam General: Pleasant, chronically ill appearing female in NAD HEENT: Facial swelling with periorbital edema present.  Heart: S1,S2, RRR Gr2/6 holosys M Lungs: Bilateral breath sounds, decreased in bases bilaterally Abdomen: Active BS non-tender Extremities: No LE edema Dialysis Access: LIJ [facility] CDI   Additional Objective Labs: Basic Metabolic Panel:  Recent Labs Lab 11/23/16 1052 11/24/16 1047 11/26/16 0316  NA 135 134* 134*  K 4.2 4.7 4.9  CL 96* 96* 95*  CO2 24 23 22   GLUCOSE 72 78 71  BUN 24* 40* 73*  CREATININE 6.97* 10.61* 15.22*  CALCIUM 9.6 8.5* 8.2*  PHOS  --   --  5.6*   Liver Function Tests:  Recent Labs Lab 11/23/16 1052 11/24/16 1047 11/26/16 0316  AST 52* 28  --   ALT 28 22  --   ALKPHOS 65 60  --   BILITOT 0.8 0.6  --   PROT 8.3* 7.1  --   ALBUMIN 4.6 3.8 3.6   No results for input(s): LIPASE, AMYLASE in the last 168 hours. CBC:  Recent Labs Lab 11/23/16 1146 11/24/16 1047 11/26/16 0316  WBC 3.0* 3.1* 2.5*  NEUTROABS 2.3 2.3  --   HGB 10.0* 8.9* 8.8*  HCT 31.3* 28.9* 27.8*  MCV 97.8 99.7 95.5  PLT REPEATED TO VERIFY 111* 107*   Blood Culture    Component Value Date/Time   SDES BLOOD LEFT ANTECUBITAL 11/23/2016 2054   SDES BLOOD LEFT ANTECUBITAL 11/23/2016 2054   SPECREQUEST IN PEDIATRIC BOTTLE Blood Culture adequate volume 11/23/2016 2054   SPECREQUEST IN PEDIATRIC BOTTLE Blood Culture adequate volume 11/23/2016 2054   CULT NO GROWTH 3 DAYS 11/23/2016 2054   CULT NO GROWTH 3 DAYS 11/23/2016 2054   REPTSTATUS  PENDING 11/23/2016 2054   REPTSTATUS PENDING 11/23/2016 2054    Cardiac Enzymes:  Recent Labs Lab 11/23/16 2054 11/24/16 0502 11/24/16 1047  TROPONINI <0.03 <0.03 <0.03   CBG: No results for input(s): GLUCAP in the last 168 hours. Iron Studies: No results for input(s): IRON, TIBC, TRANSFERRIN, FERRITIN in the last 72 hours. @lablastinr3 @ Studies/Results: No results found. Medications:  . amLODipine  10 mg Oral Daily  . calcium acetate  1,334 mg Oral TID WC  . [START ON 12/04/2016] darbepoetin (ARANESP) injection - DIALYSIS  60 mcg Intravenous Q Fri-HD  . famotidine  20 mg Oral Daily  . hydrALAZINE  25 mg Oral Q8H  . levofloxacin  500 mg Oral Q48H  . multivitamin  1 tablet Oral QHS  . rosuvastatin  10 mg Oral QHS  . warfarin  5 mg Oral ONCE-1800  . Warfarin - Pharmacist Dosing Inpatient   Does not apply q1800    HD Orders: [facility] MWF 3h 180F BFR 350/800 2K/3Ca EDW 49 kg Heparin 3000 U  No VDRA NO ESA   Assessment/Plan: 1. Multifocal PNA. Hazy airspace opacities upper and lower lobes on CT angio-no evidence of PE, Respiratory panel positive for Rhinovirus / Enterovirus. T max  99.0. WBC 2.5. On Levaquin  per primary. Add incentive spirometry, mucinex to liquify secretions.  2. ESRD -HD today on schedule. K+ 4.9. Increase time to 3.5 hours. 2.0 K bath.  Not well dialyzed 3. Anemia - HGB 8.8 Rec'd Aranesp 60 mcg IV 11/26/16 Follow HGB.  4. Secondary hyperparathyroidism - Ca 8.2 C Ca 8.5 Phos 5.6. Uses 3.0 Ca bath. Continue binders.  5. HTN/volume -Last HD early morning 11/26/16 Net UF 1700 Post wt 49 kg. Appear euvolemic-HD today to goal of 49 kg. BP controlled. Continue hydralazine and amlodipine.  6. Nutrition - Albumin 3.6 appetite improving. Renal Diet with fld restrictions, add renal vit/nepro. 7. 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  9. Leukopenia: WBC 2.5. No longer on  immunosuppressant meds. Per primary.   Olegario Emberson H. Lunden Stieber NP-C 11/27/2016, 10:15 AM  [employer] [phone] I have seen and examined this patient and agree with the plan of care seen, counseled, examined, eval.   .  DETERDING,JAMES L 11/27/2016, 12:38 PM

## 2016-11-27 NOTE — Progress Notes (Signed)
PROGRESS NOTE    Crystal Dunlap  PZW:[id number] DOB: 06-05-1991 DOA: 11/23/2016 PCP: Pediatrics, [facility]   Brief Narrative: Crystal Dunlap is a 26 y.o. female with history of ESRD on hemodialysis, history of DVT, hypertension presented to the ER with complaints of fever and chills.   Assessment & Plan:   Sepsis due to multifocal pneumonia:  -CT chest shows multifocal pneumonia, no PE -improving very slowly, still with moderate symptom burden and pleuritic chest pain -supportive care, Abx-levaquin -resp virus panel with rhinovirus -FU Blood Cx-NGTD -ambulate, OOB  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  Renal Transplants -HD per Renal, last yesterday  H/o DVT -continue coumadin per pharmacy -stable  HTN -stable, continue home regimen  Anemia of chronic disease -on Iron/EPo with HD  Leukopenia -etiology unclear, not on immunosuppressants  -needs heme referral  DVT prophylaxis: Coumadin. Code Status: Full code.  Family Communication: Discussed with patient.  Disposition Plan: Home.   Consultants:   Nephrology.    Procedures: none.    Antimicrobials: levaquin    Subjective: Still feels poorly, coughing, some pain  Objective: Vitals:   11/27/16 1300 11/27/16 1330 11/27/16 1400 11/27/16 1430  BP: (!) 112/92 117/75 107/77 116/64  Pulse: 68 74 60 (!) 51  Resp: 16 17    Temp:      TempSrc:      SpO2: 98%     Weight:      Height:        Intake/Output Summary (Last 24 hours) at 11/27/16 1504 Last data filed at 11/27/16 0900  Gross per 24 hour  Intake              975 ml  Output                0 ml  Net              975 ml   Filed Weights   11/26/16 0218 11/26/16 0525 11/27/16 1200  Weight: 50.7 kg (111 lb 12.4 oz) 49 kg (108 lb 0.4 oz) 49 kg (108 lb 0.4 oz)    Examination:  General exam:young female, ill appearing, uncomfortable Respiratory system: scattered rales at bases, poor air movement Cardiovascular system:  S1 & S2 heard, RRR. No JVD, murmurs, Gastrointestinal system: Abdomen is nondistended, soft and nontender.Normal bowel sounds heard. Central nervous system: Alert and oriented. No focal neurological deficits. Extremities: Symmetric 5 x 5 power. Skin: No rashes, lesions or ulcers Psychiatry: Judgement and insight appear normal. Mood & affect appropriate.     Data Reviewed: I have personally reviewed following labs and imaging studies  CBC:  Recent Labs Lab 11/23/16 1146 11/24/16 1047 11/26/16 0316 11/27/16 1330  WBC 3.0* 3.1* 2.5* 2.1*  NEUTROABS 2.3 2.3  --   --   HGB 10.0* 8.9* 8.8* 9.3*  HCT 31.3* 28.9* 27.8* 29.4*  MCV 97.8 99.7 95.5 95.1  PLT REPEATED TO VERIFY 111* 107* 423*   Basic Metabolic Panel:  Recent Labs Lab 11/23/16 1052 11/24/16 1047 11/26/16 0316 11/27/16 1330  NA 135 134* 134* 138  K 4.2 4.7 4.9 4.1  CL 96* 96* 95* 99*  CO2 24 23 22 26   GLUCOSE 72 78 71 96  BUN 24* 40* 73* 42*  CREATININE 6.97* 10.61* 15.22* 10.23*  CALCIUM 9.6 8.5* 8.2* 8.0*  PHOS  --   --  5.6* 3.9   GFR: Estimated Creatinine Clearance: 6.4 mL/min (A) (by C-G formula based on SCr of 10.23 mg/dL (  H)). Liver Function Tests:  Recent Labs Lab 11/23/16 1052 11/24/16 1047 11/26/16 0316 11/27/16 1330  AST 52* 28  --   --   ALT 28 22  --   --   ALKPHOS 65 60  --   --   BILITOT 0.8 0.6  --   --   PROT 8.3* 7.1  --   --   ALBUMIN 4.6 3.8 3.6 3.5   No results for input(s): LIPASE, AMYLASE in the last 168 hours. No results for input(s): AMMONIA in the last 168 hours. Coagulation Profile:  Recent Labs Lab 11/23/16 1146 11/24/16 0742 11/25/16 1147 11/26/16 0335 11/27/16 1214  INR 3.11 3.08 2.96 2.80 2.90   Cardiac Enzymes:  Recent Labs Lab 11/23/16 2054 11/24/16 0502 11/24/16 1047  TROPONINI <0.03 <0.03 <0.03   BNP (last 3 results) No results for input(s): PROBNP in the last 8760 hours. HbA1C: No results for input(s): HGBA1C in the last 72 hours. CBG: No  results for input(s): GLUCAP in the last 168 hours. Lipid Profile: No results for input(s): CHOL, HDL, LDLCALC, TRIG, CHOLHDL, LDLDIRECT in the last 72 hours. Thyroid Function Tests: No results for input(s): TSH, T4TOTAL, FREET4, T3FREE, THYROIDAB in the last 72 hours. Anemia Panel: No results for input(s): VITAMINB12, FOLATE, FERRITIN, TIBC, IRON, RETICCTPCT in the last 72 hours. Sepsis Labs:  Recent Labs Lab 11/23/16 1106 11/23/16 1628 11/23/16 2054 11/25/16 1147 11/27/16 1214  PROCALCITON  --   --  1.72 2.98 2.77  LATICACIDVEN 2.05* 0.81 0.7  --   --     Recent Results (from the past 240 hour(s))  Rapid strep screen     Status: None   Collection Time: 11/23/16 10:11 AM  Result Value Ref Range Status   Streptococcus, Group A Screen (Direct) NEGATIVE NEGATIVE Final    Comment: (NOTE) A Rapid Antigen test may result negative if the antigen level in the sample is below the detection level of this test. The FDA has not cleared this test as a stand-alone test therefore the rapid antigen negative result has reflexed to a Group A Strep culture.   Culture, group A strep     Status: None   Collection Time: 11/23/16 10:11 AM  Result Value Ref Range Status   Specimen Description THROAT  Final   Special Requests NONE Reflexed from [id number]  Final   Culture   Final    NO GROUP A STREP (S.PYOGENES) ISOLATED Performed at [facility], [address], [place], [place] [address]    Report Status 11/25/2016 FINAL  Final  Respiratory Panel by PCR     Status: Abnormal   Collection Time: 11/23/16  8:02 PM  Result Value Ref Range Status   Adenovirus NOT DETECTED NOT DETECTED Final   Coronavirus 229E NOT DETECTED NOT DETECTED Final   Coronavirus HKU1 NOT DETECTED NOT DETECTED Final   Coronavirus NL63 NOT DETECTED NOT DETECTED Final   Coronavirus OC43 NOT DETECTED NOT DETECTED Final   Metapneumovirus NOT DETECTED NOT DETECTED Final   Rhinovirus / Enterovirus DETECTED (A) NOT DETECTED  Final   Influenza A NOT DETECTED NOT DETECTED Final   Influenza B NOT DETECTED NOT DETECTED Final   Parainfluenza Virus 1 NOT DETECTED NOT DETECTED Final   Parainfluenza Virus 2 NOT DETECTED NOT DETECTED Final   Parainfluenza Virus 3 NOT DETECTED NOT DETECTED Final   Parainfluenza Virus 4 NOT DETECTED NOT DETECTED Final   Respiratory Syncytial Virus NOT DETECTED NOT DETECTED Final   Bordetella pertussis NOT  DETECTED NOT DETECTED Final   Chlamydophila pneumoniae NOT DETECTED NOT DETECTED Final   Mycoplasma pneumoniae NOT DETECTED NOT DETECTED Final  Culture, blood (routine x 2)     Status: None (Preliminary result)   Collection Time: 11/23/16  8:54 PM  Result Value Ref Range Status   Specimen Description BLOOD LEFT ANTECUBITAL  Final   Special Requests IN PEDIATRIC BOTTLE Blood Culture adequate volume  Final   Culture NO GROWTH 4 DAYS  Final   Report Status PENDING  Incomplete  Culture, blood (routine x 2)     Status: None (Preliminary result)   Collection Time: 11/23/16  8:54 PM  Result Value Ref Range Status   Specimen Description BLOOD LEFT ANTECUBITAL  Final   Special Requests IN PEDIATRIC BOTTLE Blood Culture adequate volume  Final   Culture NO GROWTH 4 DAYS  Final   Report Status PENDING  Incomplete  MRSA PCR Screening     Status: None   Collection Time: 11/23/16 10:20 PM  Result Value Ref Range Status   MRSA by PCR NEGATIVE NEGATIVE Final    Comment:        The GeneXpert MRSA Assay (FDA approved for NASAL specimens only), is one component of a comprehensive MRSA colonization surveillance program. It is not intended to diagnose MRSA infection nor to guide or monitor treatment for MRSA infections.          Radiology Studies: No results found.      Scheduled Meds: . [START ON 11/28/2016] amLODipine  5 mg Oral QHS  . calcium acetate  1,334 mg Oral TID WC  . [START ON 12/04/2016] darbepoetin (ARANESP) injection - DIALYSIS  60 mcg Intravenous Q Fri-HD  .  famotidine  20 mg Oral Daily  . guaiFENesin  600 mg Oral BID  . levofloxacin  500 mg Oral Q48H  . multivitamin  1 tablet Oral QHS  . rosuvastatin  10 mg Oral QHS  . warfarin  5 mg Oral ONCE-1800  . Warfarin - Pharmacist Dosing Inpatient   Does not apply q1800   Continuous Infusions: . sodium chloride    . sodium chloride       LOS: 3 days    Time spent: 25 MINUTES.    Domenic Polite, MD [facility] Pager [phone]   If 7PM-7AM, please contact night-coverage [email or web address] Password [email or web address] 11/27/2016, 3:04 PM

## 2016-11-28 DIAGNOSIS — J069 Acute upper respiratory infection, unspecified: Secondary | ICD-10-CM

## 2016-11-28 LAB — CULTURE, BLOOD (ROUTINE X 2)
CULTURE: NO GROWTH
Culture: NO GROWTH
SPECIAL REQUESTS: ADEQUATE
SPECIAL REQUESTS: ADEQUATE

## 2016-11-28 LAB — PROTIME-INR
INR: 2.32
Prothrombin Time: 25.9 seconds — ABNORMAL HIGH (ref 11.4–15.2)

## 2016-11-28 MED ORDER — WARFARIN SODIUM 5 MG PO TABS: 7.5000 mg | ORAL_TABLET | Freq: Once | ORAL | Status: DC

## 2016-11-28 MED ORDER — LEVOFLOXACIN 500 MG PO TABS: 500.0000 mg | ORAL_TABLET | ORAL | 0 refills | Status: DC

## 2016-11-28 NOTE — Progress Notes (Signed)
ANTICOAGULATION CONSULT NOTE - FOLLOW UP  Pharmacy Consult:  Coumadin Indication: History of PE/DVT   Allergies  Allergen Reactions  . Cefazolin Anaphylaxis  . Ferrlecit [Na Ferric Gluc Cplx In Sucrose] Anaphylaxis    Anaphylactic shock/severe angioedema on 08/09/15 related to IV iron Ferrlecit IV)  required intubation for respiratory failure  . Cyclosporine Other (See Comments)    Loss of motor skills  . Lactose Intolerance (Gi) Nausea Only    Stomach pains also  . Latex Rash  . Vancomycin Rash    Patient Measurements: Height: 5\' 2"  (157.5 cm) Weight: 106 lb 6.4 oz (48.3 kg) IBW/kg (Calculated) : 50.1   Vital Signs: Temp: 98.4 F (36.9 C) (06/02 0536) Temp Source: Oral (06/02 0536) BP: 111/83 (06/02 0536) Pulse Rate: 68 (06/02 0536)  Labs:  Recent Labs  11/26/16 0316 11/26/16 0335 11/27/16 1214 11/27/16 1330 11/28/16 0507  HGB 8.8*  --   --  9.3*  --   HCT 27.8*  --   --  29.4*  --   PLT 107*  --   --  122*  --   LABPROT  --  30.1* 30.9*  --  25.9*  INR  --  2.80 2.90  --  2.32  CREATININE 15.22*  --   --  10.23*  --     Estimated Creatinine Clearance: 6.3 mL/min (A) (by C-G formula based on SCr of 10.23 mg/dL (H)).  Assessment: 1 YOF presented on 11/23/16 with fever, cough, congestion, and sore throat. The patient was on warfarin PTA for history of PE/DVT.  Recent CT neg. for PE.   Coumadin dose was reduced given slightly supra-therapeutic INR on admit and initiation of Levaquin that could increase the effectiveness of Coumadin.  INR trending down but remaining therapeutic; no bleeding reported.  Home Coumadin dose:  8.5mg  PO daily  Goal of Therapy:  INR 2-3   Plan:  - Coumadin 7.5mg  PO today - Daily PT / INR   Rober Minion, PharmD., MS Clinical Pharmacist Pager:  [phone] Thank you for allowing pharmacy to be part of this patients care team. 11/28/2016, 8:41 AM

## 2016-11-28 NOTE — Discharge Summary (Signed)
Physician Discharge Summary  Crystal Dunlap QIH:[id number] DOB: October 24, 1990 DOA: 11/23/2016  PCP: Pediatrics, [facility]  Admit date: 11/23/2016 Discharge date: 11/28/2016  Time spent: 35 minutes  Recommendations for Outpatient Follow-up:  1. PCP in 1 week 2. Non urgent Heme referral for leukopenia/thrombocytopenia   Discharge Diagnoses:  Principal Problem:   Sepsis due to undetermined organism [facility]) Active Problems:   End stage renal disease on dialysis [facility])   FUO (fever of unknown origin)   History of DVT (deep vein thrombosis)   Anemia in ESRD (end-stage renal disease) (HCC)   Thrombocytopenia (HCC)   Essential hypertension   Multifocal pneumonia   Acute upper respiratory infection   Leukopenia  Discharge Condition: stable  Diet recommendation: renal  Filed Weights   11/27/16 1200 11/27/16 1530 11/28/16 0646  Weight: 49 kg (108 lb 0.4 oz) 48 kg (105 lb 13.1 oz) 48.3 kg (106 lb 6.4 oz)    History of present illness:  Crystal Thompsonis a 26 y.o.femalewith history of ESRD on hemodialysis, history of DVT, hypertension presented to the ER with complaints of fever and chills  Hospital Course:  Sepsis due to multifocal pneumonia:  -CT chest shows multifocal pneumonia, no PE -improved very slowly with supportive care, Abx-levaquin -resp virus panel with rhinovirus -FU Blood Cx-NGTD -discharged home on levaquin to complete course  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  Renal Transplants -HD per Renal, last yesterday  H/o DVT -continue coumadin, INR therapeutic  HTN -stable, continue home regimen  Anemia of chronic disease -on Iron/EPo with HD -stable  Leukopenia -etiology unclear, not on immunosuppressants  -needs heme referral, chronic since 2017   Consultations:  Renal  Discharge Exam: Vitals:   11/27/16 2122 11/28/16 0536  BP: 98/63 111/83  Pulse: 71 68  Resp: 18 17  Temp: 98.7 F (37.1 C) 98.4 F (36.9 C)     General: AAOx3 Cardiovascular: S1S2/RRR Respiratory: CTAB  Discharge Instructions   Discharge Instructions    Ambulatory referral to Hematology    Complete by:  As directed    In 1 month for Chronic Leukopenia and Thrombocytopenia   Discharge instructions    Complete by:  As directed    Renal diet   Increase activity slowly    Complete by:  As directed      Discharge Medication List as of 11/28/2016 10:34 AM    CONTINUE these medications which have CHANGED   Details  levofloxacin (LEVAQUIN) 500 MG tablet Take 1 tablet (500 mg total) by mouth every other day. For 1 days, Starting Sat 11/28/2016, Print      CONTINUE these medications which have NOT CHANGED   Details  amLODipine (NORVASC) 10 MG tablet Take 10 mg by mouth daily., Starting Mon 07/06/2016, Historical Med    calcium acetate (PHOSLO) 667 MG capsule Take 1,334 mg by mouth 3 (three) times daily with meals. , Historical Med    Cholecalciferol (VITAMIN D3) 2000 units capsule Take 2,000 Units by mouth daily., Starting Fri 05/08/2016, Historical Med    diphenhydrAMINE (BENADRYL) 25 mg capsule Take 1 capsule (25 mg total) by mouth every 6 (six) hours as needed for allergies., Starting Wed 08/14/2015, Normal    EPINEPHrine 0.3 mg/0.3 mL IJ SOAJ injection Inject 0.3 mLs (0.3 mg total) into the muscle once., Starting Fri 8/75/6433, Print    folic acid (FOLVITE) 1 MG tablet Take 1 mg by mouth daily., Historical Med    hydrALAZINE (APRESOLINE) 25 MG tablet Take 25 mg by  mouth 3 (three) times daily., Starting Sun 11/22/2016, Historical Med    multivitamin (RENA-VIT) TABS tablet Take 1 tablet by mouth daily., Starting Tue 02/12/2015, Historical Med    ranitidine (ZANTAC) 300 MG capsule Take 300 mg by mouth every evening., Historical Med    rosuvastatin (CRESTOR) 10 MG tablet Take 10 mg by mouth daily., Historical Med    !! warfarin (COUMADIN) 1 MG tablet Take 3.5 mg by mouth at bedtime. IN CONJUNCTION WITH ONE 5 MG TABLET TO  EQUAL A TOTAL DOSAGE OF 8.5 MILLIGRAMS, Historical Med    !! warfarin (COUMADIN) 5 MG tablet Take 5 mg by mouth at bedtime. IN CONJUNCTION WITH THREE AND ONE-HALF 1 MG TABLETS TO EQUAL A TOTAL DOSAGE OF 8.5 MILLIGRAMS, Historical Med    benzonatate (TESSALON) 100 MG capsule Take 1 capsule (100 mg total) by mouth every 8 (eight) hours., Starting Fri 08/16/2015, Print     !! - Potential duplicate medications found. Please discuss with provider.    STOP taking these medications     calcium carbonate (TUMS - DOSED IN MG ELEMENTAL CALCIUM) 500 MG chewable tablet      metroNIDAZOLE (METROGEL VAGINAL) 0.75 % vaginal gel      pantoprazole (PROTONIX) 40 MG tablet      predniSONE (DELTASONE) 10 MG tablet        Allergies  Allergen Reactions  . Cefazolin Anaphylaxis  . Ferrlecit [Na Ferric Gluc Cplx In Sucrose] Anaphylaxis    Anaphylactic shock/severe angioedema on 08/09/15 related to IV iron Ferrlecit IV)  required intubation for respiratory failure  . Cyclosporine Other (See Comments)    Loss of motor skills  . Lactose Intolerance (Gi) Nausea Only    Stomach pains also  . Latex Rash  . Vancomycin Rash   Follow-up Information    PCP. Schedule an appointment as soon as possible for a visit in 1 week(s).            The results of significant diagnostics from this hospitalization (including imaging, microbiology, ancillary and laboratory) are listed below for reference.    Significant Diagnostic Studies: Dg Chest 2 View  Result Date: 11/23/2016 CLINICAL DATA:  Cough and headache since last night, on dialysis EXAM: CHEST  2 VIEW COMPARISON:  08/17/2015 FINDINGS: LEFT jugular central venous catheter with tip projecting over high RIGHT atrium. Wall stents identified within the distal LEFT brachiocephalic vein and SVC. Upper normal heart size. Mediastinal contours and pulmonary vascularity normal. BILATERAL nipple shadows. Lungs clear. No pleural effusion or pneumothorax. Bones  unremarkable. IMPRESSION: No acute abnormalities. Electronically Signed   By: Lavonia Dana M.D.   On: 11/23/2016 10:27   Ct Angio Chest Pe W Or Wo Contrast  Result Date: 11/24/2016 CLINICAL DATA:  Acute onset of generalized chest pain. Initial encounter. EXAM: CT ANGIOGRAPHY CHEST WITH CONTRAST TECHNIQUE: Multidetector CT imaging of the chest was performed using the standard protocol during bolus administration of intravenous contrast. Multiplanar CT image reconstructions and MIPs were obtained to evaluate the vascular anatomy. CONTRAST:  68 mL of Isovue 370 IV contrast COMPARISON:  Chest radiograph performed 11/23/2016 FINDINGS: Cardiovascular:  There is no evidence of pulmonary embolus. The heart is normal in size. 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 There is resultant enhancement of diffuse collaterals about the mediastinum and along the diaphragm. There is enhancement of scattered collateral vasculature about the left side of the neck. The thoracic aorta is grossly unremarkable  in appearance. The great vessels are unremarkable, though difficult to fully assess due to the phase of contrast enhancement. Mediastinum/Nodes: Trace pericardial fluid remains within normal limits. No mediastinal lymphadenopathy is seen, though the mediastinal soft tissues are difficult to fully characterize due to mild underlying soft tissue edema. The visualized portions of the thyroid gland are unremarkable. No axillary lymphadenopathy is appreciated. Lungs/Pleura: A trace right-sided pleural effusion appears chronic in nature. Minimal hazy airspace opacities within the upper and lower lobes bilaterally raises concern for mild multifocal pneumonia. No pneumothorax is seen. No dominant mass is identified. Upper Abdomen: The visualized portions of the liver and spleen are unremarkable in appearance. Musculoskeletal: No acute  osseous abnormalities are identified. The visualized musculature is unremarkable in appearance. Review of the MIP images confirms the above findings. IMPRESSION: 1. No evidence of pulmonary embolus. 2. Trace right-sided pleural effusion appears chronic in nature. Minimal hazy bilateral airspace opacities at the upper and lower lung lobes raises concern for mild multifocal pneumonia. Would correlate with the patient's symptoms. 3. 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 Resultant enhancement of diffuse collaterals about the mediastinum and along the diaphragm. Enhancement of scattered collateral vasculature about the left side of the neck. 4. Mediastinum difficult to fully assess due to mild underlying diffuse soft tissue edema. Electronically Signed   By: Garald Balding M.D.   On: 11/24/2016 01:05    Microbiology: Recent Results (from the past 240 hour(s))  Rapid strep screen     Status: None   Collection Time: 11/23/16 10:11 AM  Result Value Ref Range Status   Streptococcus, Group A Screen (Direct) NEGATIVE NEGATIVE Final    Comment: (NOTE) A Rapid Antigen test may result negative if the antigen level in the sample is below the detection level of this test. The FDA has not cleared this test as a stand-alone test therefore the rapid antigen negative result has reflexed to a Group A Strep culture.   Culture, group A strep     Status: None   Collection Time: 11/23/16 10:11 AM  Result Value Ref Range Status   Specimen Description THROAT  Final   Special Requests NONE Reflexed from [id number]  Final   Culture   Final    NO GROUP A STREP (S.PYOGENES) ISOLATED Performed at [facility], [address], [place], [place] [address]    Report Status 11/25/2016 FINAL  Final  Respiratory Panel by PCR     Status: Abnormal   Collection Time: 11/23/16  8:02 PM  Result Value Ref Range Status   Adenovirus NOT  DETECTED NOT DETECTED Final   Coronavirus 229E NOT DETECTED NOT DETECTED Final   Coronavirus HKU1 NOT DETECTED NOT DETECTED Final   Coronavirus NL63 NOT DETECTED NOT DETECTED Final   Coronavirus OC43 NOT DETECTED NOT DETECTED Final   Metapneumovirus NOT DETECTED NOT DETECTED Final   Rhinovirus / Enterovirus DETECTED (A) NOT DETECTED Final   Influenza A NOT DETECTED NOT DETECTED Final   Influenza B NOT DETECTED NOT DETECTED Final   Parainfluenza Virus 1 NOT DETECTED NOT DETECTED Final   Parainfluenza Virus 2 NOT DETECTED NOT DETECTED Final   Parainfluenza Virus 3 NOT DETECTED NOT DETECTED Final   Parainfluenza Virus 4 NOT DETECTED NOT DETECTED Final   Respiratory Syncytial Virus NOT DETECTED NOT DETECTED Final   Bordetella pertussis NOT DETECTED NOT DETECTED Final   Chlamydophila pneumoniae NOT DETECTED NOT DETECTED Final   Mycoplasma  pneumoniae NOT DETECTED NOT DETECTED Final  Culture, blood (routine x 2)     Status: None (Preliminary result)   Collection Time: 11/23/16  8:54 PM  Result Value Ref Range Status   Specimen Description BLOOD LEFT ANTECUBITAL  Final   Special Requests IN PEDIATRIC BOTTLE Blood Culture adequate volume  Final   Culture NO GROWTH 4 DAYS  Final   Report Status PENDING  Incomplete  Culture, blood (routine x 2)     Status: None (Preliminary result)   Collection Time: 11/23/16  8:54 PM  Result Value Ref Range Status   Specimen Description BLOOD LEFT ANTECUBITAL  Final   Special Requests IN PEDIATRIC BOTTLE Blood Culture adequate volume  Final   Culture NO GROWTH 4 DAYS  Final   Report Status PENDING  Incomplete  MRSA PCR Screening     Status: None   Collection Time: 11/23/16 10:20 PM  Result Value Ref Range Status   MRSA by PCR NEGATIVE NEGATIVE Final    Comment:        The GeneXpert MRSA Assay (FDA approved for NASAL specimens only), is one component of a comprehensive MRSA colonization surveillance program. It is not intended to diagnose  MRSA infection nor to guide or monitor treatment for MRSA infections.      Labs: Basic Metabolic Panel:  Recent Labs Lab 11/23/16 1052 11/24/16 1047 11/26/16 0316 11/27/16 1330  NA 135 134* 134* 138  K 4.2 4.7 4.9 4.1  CL 96* 96* 95* 99*  CO2 24 23 22 26   GLUCOSE 72 78 71 96  BUN 24* 40* 73* 42*  CREATININE 6.97* 10.61* 15.22* 10.23*  CALCIUM 9.6 8.5* 8.2* 8.0*  PHOS  --   --  5.6* 3.9   Liver Function Tests:  Recent Labs Lab 11/23/16 1052 11/24/16 1047 11/26/16 0316 11/27/16 1330  AST 52* 28  --   --   ALT 28 22  --   --   ALKPHOS 65 60  --   --   BILITOT 0.8 0.6  --   --   PROT 8.3* 7.1  --   --   ALBUMIN 4.6 3.8 3.6 3.5   No results for input(s): LIPASE, AMYLASE in the last 168 hours. No results for input(s): AMMONIA in the last 168 hours. CBC:  Recent Labs Lab 11/23/16 1146 11/24/16 1047 11/26/16 0316 11/27/16 1330  WBC 3.0* 3.1* 2.5* 2.1*  NEUTROABS 2.3 2.3  --   --   HGB 10.0* 8.9* 8.8* 9.3*  HCT 31.3* 28.9* 27.8* 29.4*  MCV 97.8 99.7 95.5 95.1  PLT REPEATED TO VERIFY 111* 107* 122*   Cardiac Enzymes:  Recent Labs Lab 11/23/16 2054 11/24/16 0502 11/24/16 1047  TROPONINI <0.03 <0.03 <0.03   BNP: BNP (last 3 results) No results for input(s): BNP in the last 8760 hours.  ProBNP (last 3 results) No results for input(s): PROBNP in the last 8760 hours.  CBG: No results for input(s): GLUCAP in the last 168 hours.     SignedDomenic Polite MD.  [facility] 11/28/2016, 1:46 PM

## 2016-11-28 NOTE — Progress Notes (Signed)
Subjective: Interval History: has no complaint, coughing less.  Objective: Vital signs in last 24 hours: Temp:  [98.1 F (36.7 C)-99 F (37.2 C)] 98.4 F (36.9 C) (06/02 0536) Pulse Rate:  [51-87] 68 (06/02 0536) Resp:  [15-18] 17 (06/02 0536) BP: (98-133)/(56-92) 111/83 (06/02 0536) SpO2:  [96 %-100 %] 100 % (06/02 0536) Weight:  [48 kg (105 lb 13.1 oz)-49 kg (108 lb 0.4 oz)] 48.3 kg (106 lb 6.4 oz) (06/02 0646) Weight change:   Intake/Output from previous day: 06/01 0701 - 06/02 0700 In: 620 [P.O.:620] Out: 1000  Intake/Output this shift: No intake/output data recorded.  General appearance: cooperative and no distress Resp: clear to auscultation bilaterally Chest wall: IJ cath Cardio: S1, S2 normal and systolic murmur: systolic ejection 2/6, decrescendo at 2nd left intercostal space GI: soft, non-tender; bowel sounds normal; no masses,  no organomegaly Extremities: extremities normal, atraumatic, no cyanosis or edema  Lab Results:  Recent Labs  11/26/16 0316 11/27/16 1330  WBC 2.5* 2.1*  HGB 8.8* 9.3*  HCT 27.8* 29.4*  PLT 107* 122*   BMET:  Recent Labs  11/26/16 0316 11/27/16 1330  NA 134* 138  K 4.9 4.1  CL 95* 99*  CO2 22 26  GLUCOSE 71 96  BUN 73* 42*  CREATININE 15.22* 10.23*  CALCIUM 8.2* 8.0*   No results for input(s): PTH in the last 72 hours. Iron Studies: No results for input(s): IRON, TIBC, TRANSFERRIN, FERRITIN in the last 72 hours.  Studies/Results: No results found.  I have reviewed the patient's current medications.  Assessment/Plan: 1 ESRD bp better with better, longer HD 2 HTN stop amlod 3 Pneu resolving 4 anemia stable to better 5 HPTH vit D 6 low WBC  ??viral pneu P HD MWF, longer, stop amlod, follow WBC     LOS: 4 days   Tiffani Kadow L 11/28/2016,9:44 AM

## 2016-12-24 ENCOUNTER — Ambulatory Visit: Payer: Medicare Other

## 2016-12-24 ENCOUNTER — Other Ambulatory Visit (HOSPITAL_BASED_OUTPATIENT_CLINIC_OR_DEPARTMENT_OTHER): Payer: Medicare Other

## 2016-12-24 ENCOUNTER — Ambulatory Visit (HOSPITAL_BASED_OUTPATIENT_CLINIC_OR_DEPARTMENT_OTHER): Payer: Medicare Other | Admitting: Family

## 2016-12-24 VITALS — BP 141/101 | HR 67 | Temp 98.5°F | Resp 20 | Ht 62.0 in | Wt 109.0 lb

## 2016-12-24 DIAGNOSIS — D696 Thrombocytopenia, unspecified: Secondary | ICD-10-CM

## 2016-12-24 LAB — CBC WITH DIFFERENTIAL (CANCER CENTER ONLY)
BASO#: 0 10*3/uL (ref 0.0–0.2)
BASO%: 1.2 % (ref 0.0–2.0)
EOS%: 9.8 % — AB (ref 0.0–7.0)
Eosinophils Absolute: 0.2 10*3/uL (ref 0.0–0.5)
HCT: 32.7 % — ABNORMAL LOW (ref 34.8–46.6)
HEMOGLOBIN: 10.5 g/dL — AB (ref 11.6–15.9)
LYMPH#: 0.4 10*3/uL — ABNORMAL LOW (ref 0.9–3.3)
LYMPH%: 26.8 % (ref 14.0–48.0)
MCH: 31.5 pg (ref 26.0–34.0)
MCHC: 32.1 g/dL (ref 32.0–36.0)
MCV: 98 fL (ref 81–101)
MONO#: 0.2 10*3/uL (ref 0.1–0.9)
MONO%: 12.8 % (ref 0.0–13.0)
NEUT%: 49.4 % (ref 39.6–80.0)
NEUTROS ABS: 0.8 10*3/uL — AB (ref 1.5–6.5)
Platelets: 110 10*3/uL — ABNORMAL LOW (ref 145–400)
RBC: 3.33 10*6/uL — AB (ref 3.70–5.32)
RDW: 17.4 % — ABNORMAL HIGH (ref 11.1–15.7)
WBC: 1.6 10*3/uL — AB (ref 3.9–10.0)

## 2016-12-24 LAB — CHCC SATELLITE - SMEAR

## 2016-12-24 NOTE — Progress Notes (Signed)
Hematology/Oncology Consultation   Name: Crystal Dunlap      MRN: [id number]    Location: Room/bed info not found  Date: 12/24/2016 Time:8:48 AM   REFERRING PHYSICIAN: Preetha Joseph, MD  REASON FOR CONSULT: Leukopenia and Thrombocytopenia    DIAGNOSIS:  Medication induced leukopenia and thrombocytopenia  HISTORY OF PRESENT ILLNESS: Crystal Dunlap is a very pleasant 26 yo African American female with recent history of leukopenia and thrombocytopenia. This was noted in May. She had restarted hydralazine in late April which can sometimes cause leukopenia and in rare cases thrombocytopenia.  She has seen hem/onc Dr. D. Berenzon with [facility] and had a recent bone marrow biopsy. This showed no evidence of malignancy.  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 She is currently on dialysis M, W, F with [facility]. She has a left [place] vas cath. She states that she does not make urine. She is currently on the wait list for another transplant.  She also has histroy of DVT of the left arm after trying to have an AV fistula placed. She was put on Coumadin and is still taking. This is managed by her PCP.  No episodes of bleeding, no bruising or petechiae.  Her cycles are irregular and not heavy. She has never been pregnant.  She denies having had any issue with frequent infections. She had a recent pneumonia that responded nicely to antibiotics and she has had no other issues.  No fever, chills, n/v, cough, rash, dizziness, SOB, chest pain, palpitations, abdominal pain or changes in bowel or bladder habits.  No swelling, tenderness, numbness or tingling in her extremities. No c/o pain at this time.  She has maintained a good appetite and is staying well hydrated. Her weight is stable.  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  She is not a smoker and does not drink alcoholic beverages.  She has a maternal first cousin, 15 yo, recently diagnosed with sarcoma of the right leg. No other  familial cancer history.  Her mother has the sickle cell trait but the patient has been tested and states that she is negative.   ROS: All other 10 point review of systems is negative.   PAST MEDICAL HISTORY:   Past Medical History:  Diagnosis Date  . Anemia   . ESRD (end stage renal disease) (HCC) Feb. 12, 2010   First date of Dialysis   . ESRD (end stage renal disease) on dialysis (HCC)    "MWF; [facility]; [place]" (11/23/2016)  . Focal segmental glomerulosclerosis   . GERD (gastroesophageal reflux disease)   . Hyperlipidemia   . Hypertension   . Pulmonary emboli (HCC) ~ 2013  . Secondary hyperparathyroidism (HCC)   . SIRS (systemic inflammatory response syndrome) (HCC) 11/23/2016    ALLERGIES: Allergies  Allergen Reactions  . Cefazolin Anaphylaxis  . Ferrlecit [Na Ferric Gluc Cplx In Sucrose] Anaphylaxis    Anaphylactic shock/severe angioedema on 08/09/15 related to IV iron Ferrlecit IV)  required intubation for respiratory failure  . Cyclosporine Other (See Comments)    Loss of motor skills  . Lactose Intolerance (Gi) Nausea Only    Stomach pains also  . Latex Rash  . Vancomycin Rash      MEDICATIONS:  Current Outpatient Prescriptions on File Prior to Visit  Medication Sig Dispense Refill  . amLODipine (NORVASC) 10 MG tablet Take 10 mg by mouth daily.    . calcium acetate (PHOSLO) 667 MG capsule Take 1,334 mg   by mouth 3 (three) times daily with meals.     . Cholecalciferol (VITAMIN D3) 2000 units capsule Take 2,000 Units by mouth daily.    . diphenhydrAMINE (BENADRYL) 25 mg capsule Take 1 capsule (25 mg total) by mouth every 6 (six) hours as needed for allergies. 15 capsule 0  . EPINEPHrine 0.3 mg/0.3 mL IJ SOAJ injection Inject 0.3 mLs (0.3 mg total) into the muscle once. (Patient taking differently: Inject 0.3 mg into the muscle once as needed (FOR ANAPHYLAXIS). ) 1 Device 1  . folic acid (FOLVITE) 1 MG tablet Take 1 mg by mouth daily.    . hydrALAZINE (APRESOLINE)  25 MG tablet Take 25 mg by mouth 3 (three) times daily.    . multivitamin (RENA-VIT) TABS tablet Take 1 tablet by mouth daily.  11  . ranitidine (ZANTAC) 300 MG capsule Take 300 mg by mouth every evening.    . rosuvastatin (CRESTOR) 10 MG tablet Take 10 mg by mouth daily.    . warfarin (COUMADIN) 1 MG tablet Take 3.5 mg by mouth at bedtime. IN CONJUNCTION WITH ONE 5 MG TABLET TO EQUAL A TOTAL DOSAGE OF 8.5 MILLIGRAMS    . warfarin (COUMADIN) 5 MG tablet Take 5 mg by mouth at bedtime. IN CONJUNCTION WITH THREE AND ONE-HALF 1 MG TABLETS TO EQUAL A TOTAL DOSAGE OF 8.5 MILLIGRAMS     No current facility-administered medications on file prior to visit.      PAST SURGICAL HISTORY Past Surgical History:  Procedure Laterality Date  . COLON SURGERY  ~ 2011/2012   "for bowel obstruction"  . INSERTION OF DIALYSIS CATHETER Left ~ 2016   chest  . KIDNEY TRANSPLANT  2007    FAMILY HISTORY: No family history on file.  SOCIAL HISTORY:  reports that she has never smoked. She has never used smokeless tobacco. She reports that she does not drink alcohol or use drugs.  PERFORMANCE STATUS: The patient's performance status is 0 - Asymptomatic  PHYSICAL EXAM: Most Recent Vital Signs: Blood pressure (!) 141/101, pulse 67, temperature 98.5 F (36.9 C), temperature source Oral, resp. rate 20, height 5' 2" (1.575 m), weight 109 lb (49.4 kg), SpO2 100 %. BP (!) 141/101 (BP Location: Right Arm, Patient Position: Sitting)   Pulse 67   Temp 98.5 F (36.9 C) (Oral)   Resp 20   Ht 5' 2" (1.575 m)   Wt 109 lb (49.4 kg)   SpO2 100%   BMI 19.94 kg/m   General Appearance:    Alert, cooperative, no distress, appears stated age  Head:    Normocephalic, without obvious abnormality, atraumatic  Eyes:    PERRL, conjunctiva/corneas clear, EOM's intact, fundi    benign, both eyes        Throat:   Lips, mucosa, and tongue normal; teeth and gums normal  Neck:   Supple, symmetrical, trachea midline, no  adenopathy;    thyroid:  no enlargement/tenderness/nodules; no carotid   bruit or JVD  Back:     Symmetric, no curvature, ROM normal, no CVA tenderness  Lungs:     Clear to auscultation bilaterally, respirations unlabored  Chest Wall:    No tenderness or deformity   Heart:    Regular rate and rhythm, S1 and S2 normal, no murmur, rub   or gallop     Abdomen:     Soft, non-tender, bowel sounds active all four quadrants,    no masses, no organomegaly        Extremities:     Extremities normal, atraumatic, no cyanosis or edema  Pulses:   2+ and symmetric all extremities  Skin:   Skin color, texture, turgor normal, no rashes or lesions  Lymph nodes:   Cervical, supraclavicular, and axillary nodes normal  Neurologic:   CNII-XII intact, normal strength, sensation and reflexes    throughout    LABORATORY DATA:  Results for orders placed or performed in visit on 12/24/16 (from the past 48 hour(s))  CBC with Differential ([facility])     Status: Abnormal   Collection Time: 12/24/16  8:20 AM  Result Value Ref Range   WBC 1.6 (L) 3.9 - 10.0 10e3/uL   RBC 3.33 (L) 3.70 - 5.32 10e6/uL   HGB 10.5 (L) 11.6 - 15.9 g/dL   HCT 32.7 (L) 34.8 - 46.6 %   MCV 98 81 - 101 fL   MCH 31.5 26.0 - 34.0 pg   MCHC 32.1 32.0 - 36.0 g/dL   RDW 17.4 (H) 11.1 - 15.7 %   Platelets 110 Platelet count consistent in citrate (L) 145 - 400 10e3/uL    Comment: Result amended from () (12/24/2016 8:36 AM) to (Platelet count consistent in citrate).   NEUT# 0.8 (L) 1.5 - 6.5 10e3/uL   LYMPH# 0.4 (L) 0.9 - 3.3 10e3/uL   MONO# 0.2 0.1 - 0.9 10e3/uL   Eosinophils Absolute 0.2 0.0 - 0.5 10e3/uL   BASO# 0.0 0.0 - 0.2 10e3/uL   NEUT% 49.4 39.6 - 80.0 %   LYMPH% 26.8 14.0 - 48.0 %   MONO% 12.8 0.0 - 13.0 %   EOS% 9.8 (H) 0.0 - 7.0 %   BASO% 1.2 0.0 - 2.0 %      RADIOGRAPHY: No results found.     PATHOLOGY: None   ASSESSMENT/PLAN: Crystal Dunlap is a very pleasant 26 yo African American female with recent history of  leukopenia and thrombocytopenia. She had restarted hydralazine around that same time which can also be a cause of these side effects. She had a recent bone marrow biopsy which showed no evidence of malignancy. She has no complaints at this time.  She is followed by [facility] for FSGS and has had 2 failed kidney transplants. She is currently on the waiting list for another transplant and having dialysis M,W,F.  All questions were answered and at this time we can let her go from our office. She will follow-up with her other hematologist in [place] as needed.  She is welcome to contact our office with any other hem/onc questions or concerns in the future if needed.   She was discussed with and also seen by Dr. Ennever and he is in agreement with the aforementioned.   [place],SARAH M    Addendum:   I saw and examined the patient with Sarah. I reviewed the above assessment. I looked at her blood under the microscope. I do not see nothing that looked suspicious.  She has already had the ultimate diagnostic test. She has a bone marrow biopsy done.  I really don't think that there is any hematologic issue here. 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 She is on hydralazine. This is well known to cause leukopenia and some degree thrombocytopenia.  She is very nice. 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  As nice that she is, I just don't think we have to see her back. I just don't think that we have to see her back. Again I just   don't find any obvious hematologic issue that we have to address.  We spent about a half hour with she and her mom.  Pete Ennever, MD   

## 2016-12-26 LAB — CARDIOLIPIN ANTIBODIES, IGG, IGM, IGA
Anticardiolipin Ab,IgA,Qn: <9 APL U/mL (ref 0–11)
Anticardiolipin Ab,IgG,Qn: <9 GPL U/mL (ref 0–14)
Anticardiolipin Ab,IgM,Qn: <9 MPL U/mL (ref 0–12)

## 2016-12-26 LAB — LUPUS ANTICOAGULANT PANEL
DRVVT MIX: 38.7 s (ref 0.0–47.0)
DRVVT: 52.9 s — AB (ref 0.0–47.0)
PTT-LA: 42.4 s (ref 0.0–51.9)

## 2016-12-27 ENCOUNTER — Encounter (HOSPITAL_BASED_OUTPATIENT_CLINIC_OR_DEPARTMENT_OTHER): Payer: Self-pay | Admitting: *Deleted

## 2016-12-27 ENCOUNTER — Emergency Department (HOSPITAL_BASED_OUTPATIENT_CLINIC_OR_DEPARTMENT_OTHER)
Admission: EM | Admit: 2016-12-27 | Discharge: 2016-12-28 | Disposition: A | Discharge status: Home / Self Care | Payer: Medicare Other | Attending: Emergency Medicine | Admitting: Emergency Medicine

## 2016-12-27 ENCOUNTER — Emergency Department (HOSPITAL_BASED_OUTPATIENT_CLINIC_OR_DEPARTMENT_OTHER): Payer: Medicare Other

## 2016-12-27 DIAGNOSIS — Z79899 Other long term (current) drug therapy: Secondary | ICD-10-CM | POA: Insufficient documentation

## 2016-12-27 DIAGNOSIS — M25572 Pain in left ankle and joints of left foot: Secondary | ICD-10-CM | POA: Diagnosis present

## 2016-12-27 DIAGNOSIS — Y939 Activity, unspecified: Secondary | ICD-10-CM | POA: Diagnosis not present

## 2016-12-27 DIAGNOSIS — X58XXXA Exposure to other specified factors, initial encounter: Secondary | ICD-10-CM | POA: Diagnosis not present

## 2016-12-27 DIAGNOSIS — Y929 Unspecified place or not applicable: Secondary | ICD-10-CM | POA: Diagnosis not present

## 2016-12-27 DIAGNOSIS — Z91040 Latex allergy status: Secondary | ICD-10-CM | POA: Insufficient documentation

## 2016-12-27 DIAGNOSIS — S93402A Sprain of unspecified ligament of left ankle, initial encounter: Secondary | ICD-10-CM | POA: Insufficient documentation

## 2016-12-27 DIAGNOSIS — Z7901 Long term (current) use of anticoagulants: Secondary | ICD-10-CM | POA: Diagnosis not present

## 2016-12-27 DIAGNOSIS — I10 Essential (primary) hypertension: Secondary | ICD-10-CM | POA: Insufficient documentation

## 2016-12-27 DIAGNOSIS — Y999 Unspecified external cause status: Secondary | ICD-10-CM | POA: Diagnosis not present

## 2016-12-27 MED ORDER — ACETAMINOPHEN 500 MG PO TABS
1000.0000 mg | ORAL_TABLET | Freq: Once | ORAL | Status: AC
  Administered 2016-12-28: 1000 mg via ORAL
  Filled 2016-12-27: qty 2

## 2016-12-27 NOTE — ED Triage Notes (Signed)
Pt c/o left ankle pain for about 2 days. Reports swelling in the ankle and foot. Took tylenol without relief today.

## 2016-12-27 NOTE — ED Provider Notes (Signed)
[facility] DEPT [facility] Provider Note   CSN: [id number] Arrival date & time: 12/27/16  2247  By signing my name below, I, Margit Banda, attest that this documentation has been prepared under the direction and in the presence of [place], Shawntelle Ungar, MD. Electronically Signed: Margit Banda, ED Scribe. 12/27/16. 11:39 PM.  History   Chief Complaint Chief Complaint  Patient presents with  . Ankle Pain    HPI Garrie Elenes is a 26 y.o. female who presents to the Emergency Department complaining of severe left ankle pain that started yesterday. Pt reports swelling to her ankle and foot. She is unable to ambulate or bare weight. Took tylenol without relief ~ 6 pm. Pt is unsure of what she did to cause pain. Pt denies fever and chills.   The history is provided by the patient. No language interpreter was used.  Ankle Pain   The incident occurred yesterday. The incident occurred at home. The injury mechanism is unknown. The pain is present in the left ankle. The pain is severe. The pain has been constant since onset. Pertinent negatives include no numbness, no inability to bear weight, no loss of motion, no muscle weakness, no loss of sensation and no tingling. She reports no foreign bodies present. The symptoms are aggravated by bearing weight and palpation. She has tried acetaminophen for the symptoms. The treatment provided no relief.    Past Medical History:  Diagnosis Date  . Anemia   . ESRD (end stage renal disease) ([place]) Feb. 12, 2010   First date of Dialysis   . ESRD (end stage renal disease) on dialysis ([place])    "MWF; [facility]; [place]" (11/23/2016)  . Focal segmental glomerulosclerosis   . GERD (gastroesophageal reflux disease)   . Hyperlipidemia   . Hypertension   . Pulmonary emboli ([place]) ~ 2013  . Secondary hyperparathyroidism ([place])   . SIRS (systemic inflammatory response syndrome) ([place]) 11/23/2016    Patient Active Problem List   Diagnosis Date Noted  . Acute upper  respiratory infection   . Multifocal pneumonia 11/25/2016  . FUO (fever of unknown origin) 11/23/2016  . Sepsis due to undetermined organism ([place]) 11/23/2016  . History of DVT (deep vein thrombosis) 11/23/2016  . Anemia in ESRD (end-stage renal disease) ([place]) 11/23/2016  . Thrombocytopenia ([place]) 11/23/2016  . Essential hypertension 11/23/2016  . Respiratory failure ([place])   . Allergic reaction 08/09/2015  . Anaphylaxis 08/09/2015  . 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 End stage renal disease on dialysis ([place]) 08/09/2015  . Acute respiratory failure with hypoxia ([place]) 08/09/2015    Past Surgical History:  Procedure Laterality Date  . COLON SURGERY  ~ 2011/2012   "for bowel obstruction"  . INSERTION OF DIALYSIS CATHETER Left ~ 2016   chest  . KIDNEY TRANSPLANT  2007    OB History    No data available       Home Medications    Prior to Admission medications   Medication Sig Start Date End Date Taking? Authorizing Provider  amLODipine (NORVASC) 10 MG tablet Take 10 mg by mouth daily. 07/06/16   [provider]  calcium acetate (PHOSLO) 667 MG capsule Take 1,334 mg by mouth 3 (three) times daily with meals.     [provider]  Cholecalciferol (VITAMIN D3) 2000 units capsule Take 2,000 Units by mouth daily. 05/08/16   [provider]  diphenhydrAMINE (BENADRYL) 25 mg capsule Take 1 capsule (25 mg total) by mouth every 6 (six) hours as needed for allergies. 08/14/15  Ghimire, Henreitta Leber, MD  EPINEPHrine 0.3 mg/0.3 mL IJ SOAJ injection Inject 0.3 mLs (0.3 mg total) into the muscle once. Patient taking differently: Inject 0.3 mg into the muscle once as needed (FOR ANAPHYLAXIS).  08/16/15   Vira Blanco, MD  folic acid (FOLVITE) 1 MG tablet Take 1 mg by mouth daily.    [provider]  hydrALAZINE (APRESOLINE) 25 MG tablet Take 25 mg by mouth 3 (three) times daily. 11/22/16   [provider]  multivitamin (RENA-VIT) TABS tablet Take 1 tablet by  mouth daily. 02/12/15   [provider]  ranitidine (ZANTAC) 300 MG capsule Take 300 mg by mouth every evening.    [provider]  rosuvastatin (CRESTOR) 10 MG tablet Take 10 mg by mouth daily.    [provider]  warfarin (COUMADIN) 1 MG tablet Take 3.5 mg by mouth at bedtime. IN CONJUNCTION WITH ONE 5 MG TABLET TO EQUAL A TOTAL DOSAGE OF 8.5 MILLIGRAMS    [provider]  warfarin (COUMADIN) 5 MG tablet Take 5 mg by mouth at bedtime. IN CONJUNCTION WITH THREE AND ONE-HALF 1 MG TABLETS TO EQUAL A TOTAL DOSAGE OF 8.5 MILLIGRAMS    [provider]    Family History No family history on file.  Social History Social History  Substance Use Topics  . Smoking status: Never Smoker  . Smokeless tobacco: Never Used  . Alcohol use No     Allergies   Cefazolin; Ferrlecit [na ferric gluc cplx in sucrose]; Cyclosporine; Lactose intolerance (gi); Latex; and Vancomycin   Review of Systems Review of Systems  Constitutional: Negative for chills and fever.  Musculoskeletal: Positive for arthralgias and joint swelling. Negative for back pain and gait problem.  Neurological: Negative for tingling and numbness.  All other systems reviewed and are negative.    Physical Exam Updated Vital Signs BP (!) 170/128 (BP Location: Left Arm)   Pulse 88   Temp 98.2 F (36.8 C) (Oral)   Resp 20   Ht 5\' 2"  (1.575 m)   Wt 109 lb (49.4 kg)   SpO2 100%   BMI 19.94 kg/m   Physical Exam  Constitutional: She is oriented to person, place, and time. She appears well-developed and well-nourished.  HENT:  Head: Normocephalic.  Mouth/Throat: Oropharynx is clear and moist. No oropharyngeal exudate.  Eyes: Conjunctivae and EOM are normal. Pupils are equal, round, and reactive to light. Right eye exhibits no discharge. Left eye exhibits no discharge. No scleral icterus.  Neck: Normal range of motion. Neck supple. No JVD present. No tracheal deviation present.  Trachea  is midline. No stridor or carotid bruits.   Cardiovascular: Normal rate, regular rhythm, normal heart sounds and intact distal pulses.   No murmur heard. Pulmonary/Chest: Effort normal and breath sounds normal. No stridor. No respiratory distress. She has no wheezes. She has no rales.  Lungs CTA bilaterally.  Abdominal: Soft. Bowel sounds are normal. She exhibits no distension. There is no tenderness. There is no rebound and no guarding.  Musculoskeletal: Normal range of motion. She exhibits no edema.       Left knee: Normal. She exhibits normal patellar mobility.       Left ankle: She exhibits normal range of motion, no ecchymosis, no deformity, no laceration and normal pulse. Tenderness. AITFL tenderness found. No lateral malleolus, no medial malleolus, no CF ligament, no posterior TFL, no head of 5th metatarsal and no proximal fibula tenderness found. Achilles tendon normal.  Left lower leg: Normal. She exhibits no tenderness, no bony tenderness, no swelling, no edema, no deformity and no laceration.       Left foot: There is normal range of motion, no tenderness, no bony tenderness, normal capillary refill and no crepitus.  All compartments are soft in the foot and LLE. No palpable cords in the left LE. Achilles intact. 1+ pulses bilaterally. No warmth or fluctuance. Left foot was without warmth, erythema nor fluctuance.  It was equal in temperature to the right and had an equal pulse.  Capillary refill < 2 sec to all the toes of the left foot.  Knee normal no tenderness over  The proximal fibula.  No petechia no breaks in the skin no skin lesions.    Lymphadenopathy:    She has no cervical adenopathy.  Neurological: She is alert and oriented to person, place, and time. She has normal reflexes. She displays normal reflexes. She exhibits normal muscle tone.  Skin: Skin is warm and dry. Capillary refill takes less than 2 seconds. No rash noted.  No rash no petechiae. No breaks in the skin.    Psychiatric: She has a normal mood and affect. Her behavior is normal.  Nursing note and vitals reviewed.    ED Treatments / Results  DIAGNOSTIC STUDIES: Oxygen Saturation is 100% on RA, normal by my interpretation.   COORDINATION OF CARE: 11:39 PM-Discussed next steps with pt which includes an XR of her foot. Pt verbalized understanding and is agreeable with the plan.   Radiology  Results for orders placed or performed in visit on 12/24/16  CBC with Differential [facility])  Result Value Ref Range   WBC 1.6 (L) 3.9 - 10.0 10e3/uL   RBC 3.33 (L) 3.70 - 5.32 10e6/uL   HGB 10.5 (L) 11.6 - 15.9 g/dL   HCT 32.7 (L) 34.8 - 46.6 %   MCV 98 81 - 101 fL   MCH 31.5 26.0 - 34.0 pg   MCHC 32.1 32.0 - 36.0 g/dL   RDW 17.4 (H) 11.1 - 15.7 %   Platelets 110 Platelet count consistent in citrate (L) 145 - 400 10e3/uL   NEUT# 0.8 (L) 1.5 - 6.5 10e3/uL   LYMPH# 0.4 (L) 0.9 - 3.3 10e3/uL   MONO# 0.2 0.1 - 0.9 10e3/uL   Eosinophils Absolute 0.2 0.0 - 0.5 10e3/uL   BASO# 0.0 0.0 - 0.2 10e3/uL   NEUT% 49.4 39.6 - 80.0 %   LYMPH% 26.8 14.0 - 48.0 %   MONO% 12.8 0.0 - 13.0 %   EOS% 9.8 (H) 0.0 - 7.0 %   BASO% 1.2 0.0 - 2.0 %  [facility] - Smear  Result Value Ref Range   Smear Result Smear Available   Cardiolipin antibodies, IgG, IgM, IgA  Result Value Ref Range   Anticardiolipin Ab,IgG,Qn <9 0 - 14 GPL U/mL   Anticardiolipin Ab,IgM,Qn <9 0 - 12 MPL U/mL   Anticardiolipin Ab,IgA,Qn <9 0 - 11 APL U/mL  Lupus anticoagulant panel  Result Value Ref Range   PTT-LA 42.4 0.0 - 51.9 sec   dRVVT 52.9 (H) 0.0 - 47.0 sec   dRVVT Mix 38.7 0.0 - 47.0 sec   Lupus Reflex Interpretation Comment:    Dg Ankle Complete Left  Result Date: 12/27/2016 CLINICAL DATA:  Acute onset of left ankle pain and swelling. Initial encounter. EXAM: LEFT ANKLE COMPLETE - 3+ VIEW COMPARISON:  None. FINDINGS: There is no evidence of fracture or dislocation. The ankle mortise is intact; the  interosseous space is  within normal limits. No talar tilt or subluxation is seen. The joint spaces are preserved. Lateral soft tissue swelling is noted. IMPRESSION: No evidence of fracture or dislocation. Electronically Signed   By: Garald Balding M.D.   On: 12/27/2016 23:26   Dg Foot Complete Left  Result Date: 12/28/2016 CLINICAL DATA:  Acute onset of left posterior foot pain. Initial encounter. EXAM: LEFT FOOT - COMPLETE 3+ VIEW COMPARISON:  None. FINDINGS: There is no evidence of fracture or dislocation. The joint spaces are preserved. There is no evidence of talar subluxation; the subtalar joint is unremarkable in appearance. No significant soft tissue abnormalities are seen. IMPRESSION: No evidence of fracture or dislocation. Electronically Signed   By: Garald Balding M.D.   On: 12/28/2016 00:11   Procedures Procedures (including critical care time)  Medications Ordered in ED Medications  acetaminophen (TYLENOL) tablet 1,000 mg (1,000 mg Oral Given 12/28/16 0026)  traMADol (ULTRAM) tablet 50 mg (50 mg Oral Given 12/28/16 0026)      Final Clinical Impressions(s) / ED Diagnoses  Exam is consistent with sprain but given patient's history have set patient up for outpatient doppler to excluded early DVT. Patient and mother verbalize understanding of need to ice leg and follow up with PMD and for outpatient doppler.   Follow up with sports medicine and your PMD for ongoing care.   Strict return precautions given. Return immediately for fever >101, worsening swelling, loss of sensation,bleeding or any concerns. Follow up with your own doctor for ongoing concerns.   The patient is nontoxic-appearing on exam and vital signs are within normal limits.   I have reviewed the triage vital signs and the nursing notes. Pertinent labs &imaging results that were available during my care of the patient were reviewed by me and considered in my medical decision making (see chart for details).  After history, exam, and  medical workup I feel the patient has been appropriately medically screened and is safe for discharge home. Pertinent diagnoses were discussed with the patient. Patient was given return precautions.   I personally performed the services described in this documentation, which was scribed in my presence. The recorded information has been reviewed and is accurate.      Eathel Pajak, MD 12/28/16 0100

## 2016-12-27 NOTE — ED Notes (Signed)
Patient transported to X-ray 

## 2016-12-28 ENCOUNTER — Other Ambulatory Visit (HOSPITAL_BASED_OUTPATIENT_CLINIC_OR_DEPARTMENT_OTHER): Payer: Medicare Other

## 2016-12-28 ENCOUNTER — Encounter (HOSPITAL_BASED_OUTPATIENT_CLINIC_OR_DEPARTMENT_OTHER): Payer: Self-pay | Admitting: Emergency Medicine

## 2016-12-28 DIAGNOSIS — S93402A Sprain of unspecified ligament of left ankle, initial encounter: Secondary | ICD-10-CM | POA: Diagnosis not present

## 2016-12-28 MED ORDER — TRAMADOL HCL 50 MG PO TABS: 50.0000 mg | ORAL_TABLET | Freq: Two times a day (BID) | ORAL | 0 refills | Status: DC | PRN

## 2016-12-28 MED ORDER — DICLOFENAC SODIUM 1 % TD GEL: 4.0000 g | Freq: Four times a day (QID) | TRANSDERMAL | 0 refills | Status: DC

## 2016-12-28 MED ORDER — TRAMADOL HCL 50 MG PO TABS
50.0000 mg | ORAL_TABLET | Freq: Once | ORAL | Status: AC
  Administered 2016-12-28: 50 mg via ORAL
  Filled 2016-12-28: qty 1

## 2016-12-28 NOTE — ED Notes (Signed)
Pt and FM given d/c instructions as per chart. Rx x 2. Verbalizes understanding. No questions.

## 2016-12-28 NOTE — ED Notes (Signed)
PMS intact before and after. Pt tolerated well. All questions answered. 

## 2016-12-29 ENCOUNTER — Ambulatory Visit (HOSPITAL_BASED_OUTPATIENT_CLINIC_OR_DEPARTMENT_OTHER)
Admission: RE | Admit: 2016-12-29 | Discharge: 2016-12-29 | Disposition: A | Discharge status: Home / Self Care | Payer: Medicare Other | Source: Ambulatory Visit | Attending: Emergency Medicine | Admitting: Emergency Medicine

## 2016-12-29 ENCOUNTER — Other Ambulatory Visit (HOSPITAL_BASED_OUTPATIENT_CLINIC_OR_DEPARTMENT_OTHER): Payer: Self-pay | Admitting: Emergency Medicine

## 2016-12-29 DIAGNOSIS — M7989 Other specified soft tissue disorders: Secondary | ICD-10-CM

## 2016-12-29 DIAGNOSIS — M79662 Pain in left lower leg: Secondary | ICD-10-CM | POA: Diagnosis not present

## 2016-12-29 NOTE — ED Provider Notes (Signed)
Result of negative doppler given to patient   Crystal Belling, MD 12/29/16 1133

## 2017-04-14 IMAGING — CR DG CHEST 1V PORT
1 series · 1 of 1 positions shown · non-contrast
Comparison: Yesterday

CLINICAL DATA: Respiratory failure and shortness of breath

EXAM:
PORTABLE CHEST 1 VIEW

[AP]
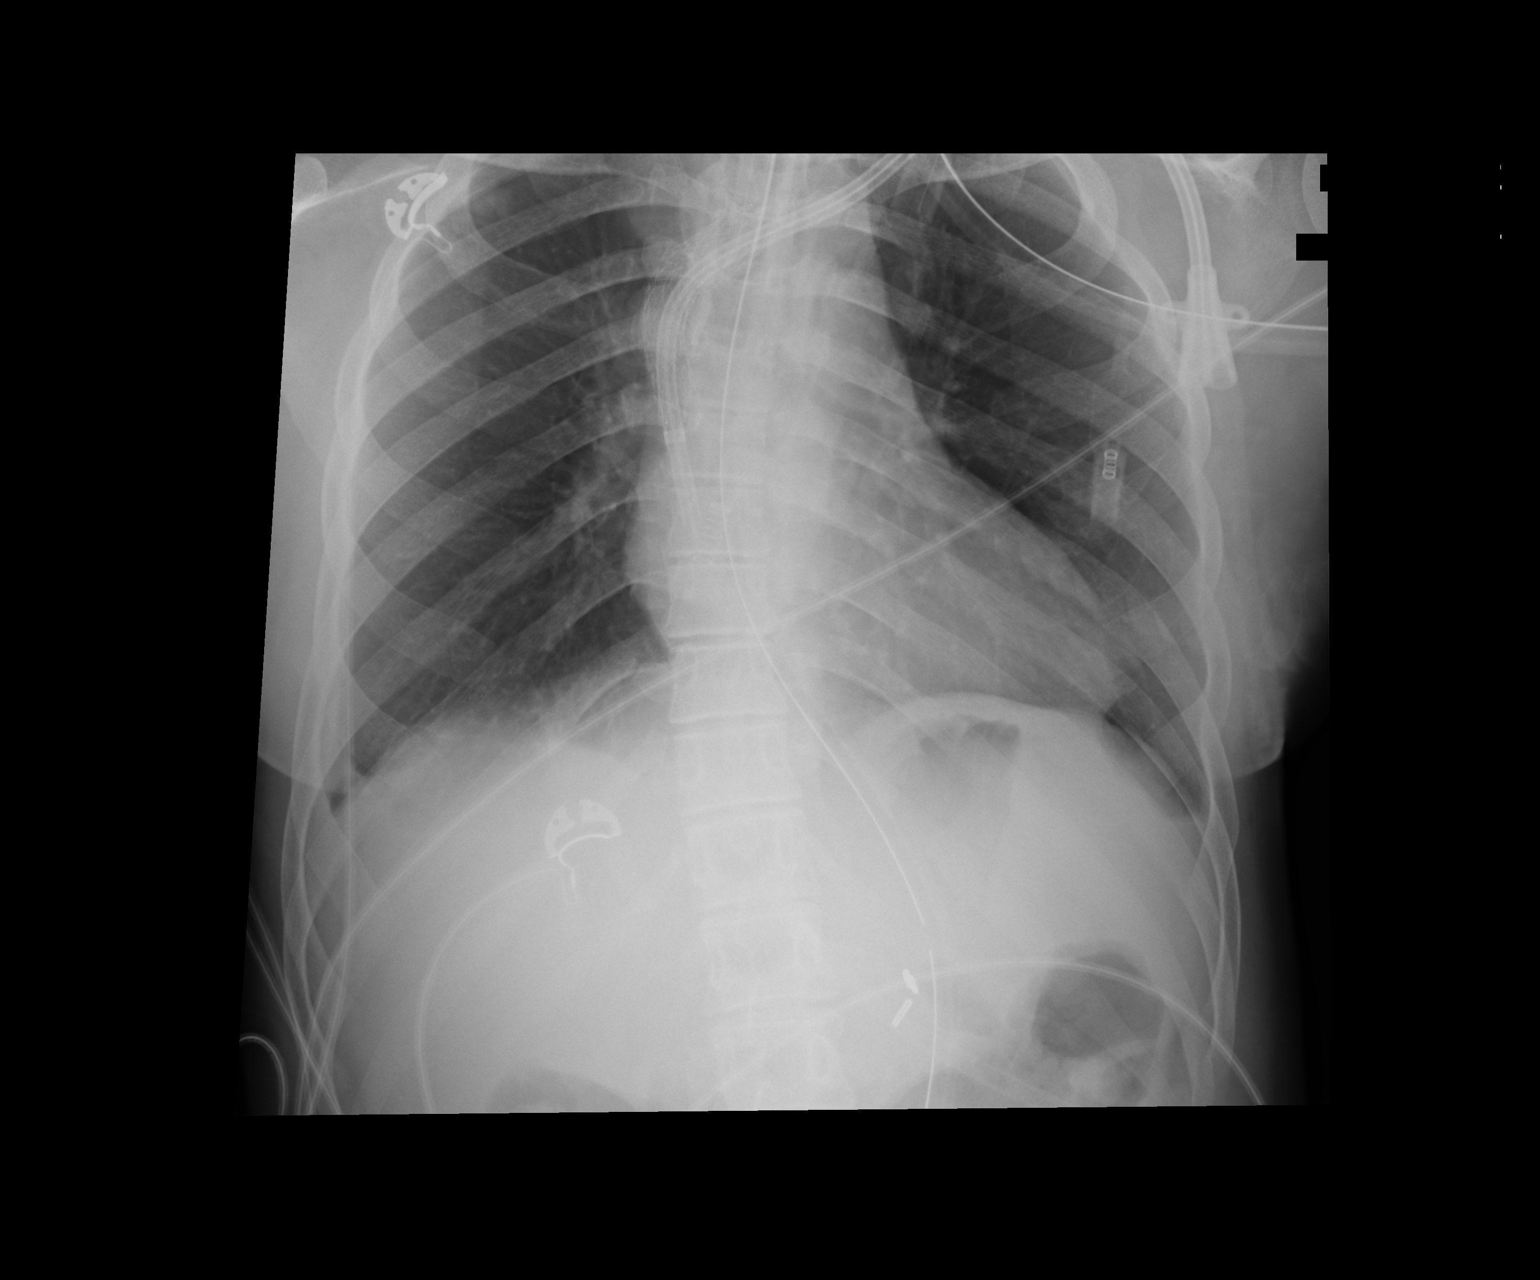

[1 of 1 positions shown; findings below may reference images not displayed]

FINDINGS: Endotracheal tube tip between the clavicular heads and carina. The
orogastric tube reaches the stomach at least. Dialysis catheter from
the left tip at the upper right atrium.

Small pleural effusions. There is no edema, consolidation, or
pneumothorax. Normal heart size.
IMPRESSION: 1. Stable positioning of tubes and dialysis catheter.
2. Small pleural effusions.

## 2017-04-21 IMAGING — CT CT HEAD W/O CM
3 of 6 series · 15 of 47 positions shown, 18 images · non-contrast
Comparison: None.

CLINICAL DATA: Pt's mother reports pt was extubated this past
[REDACTED] and discharge from hospital this past [REDACTED]. Last
dialysis treatment was yesterday but they were unable to pull off
fluid at that time d/t hypotension. Pt was leaving home today around
2355 to go to dialysis when she fell in the driveway. Reports
hitting her head (denies LOC, no injury noted at this time).

EXAM:
CT HEAD WITHOUT CONTRAST
CT MAXILLOFACIAL WITHOUT CONTRAST
TECHNIQUE: Multidetector CT imaging of the head and maxillofacial structures
were performed using the standard protocol without intravenous
contrast. Multiplanar CT image reconstructions of the maxillofacial
structures were also generated.

[Series 4: max soft · axial · 0.35mm/px · z∈[-251,-143]mm · 10 of 66 slices shown, 13 images]
[im 6/66  brain]
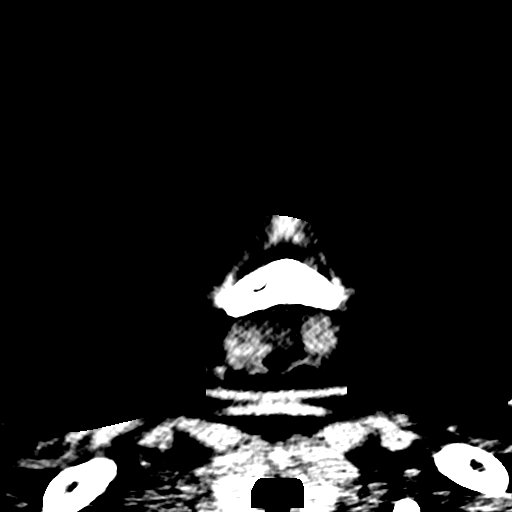
[im 6/66  bone]
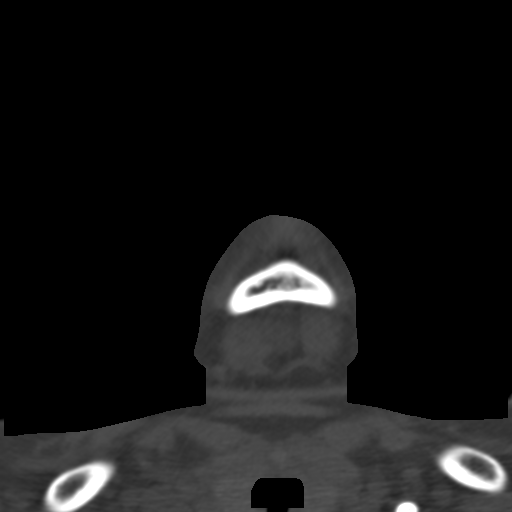
[im 11/66  brain]
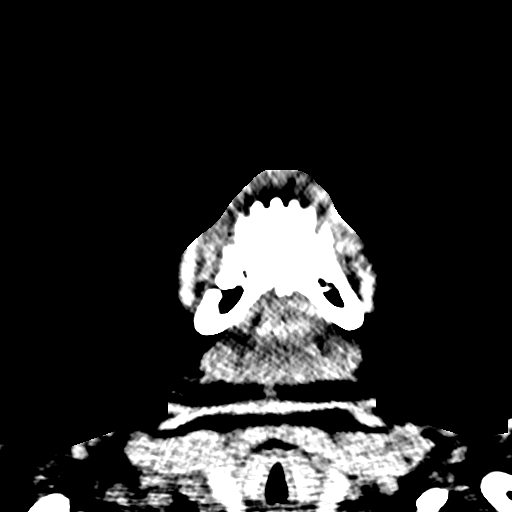
[im 17/66  brain]
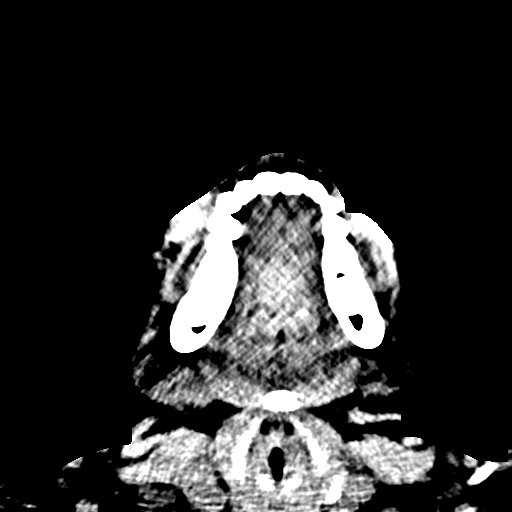
[im 22/66  brain]
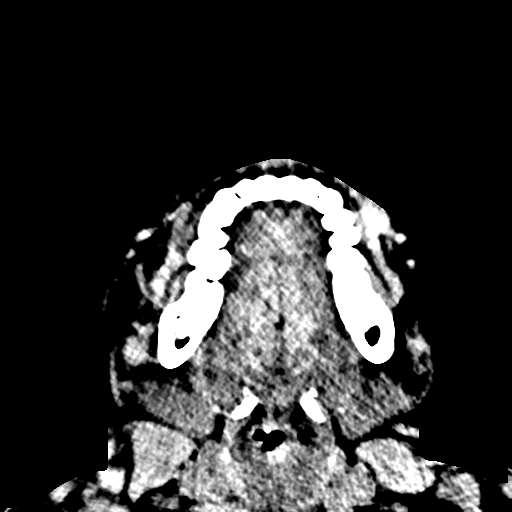
[im 28/66  brain]
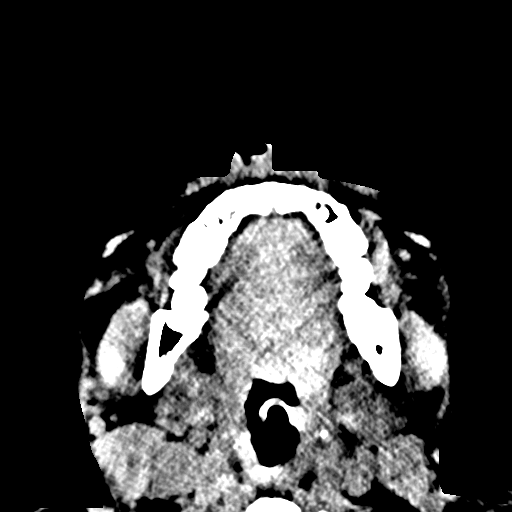
[im 28/66  bone]
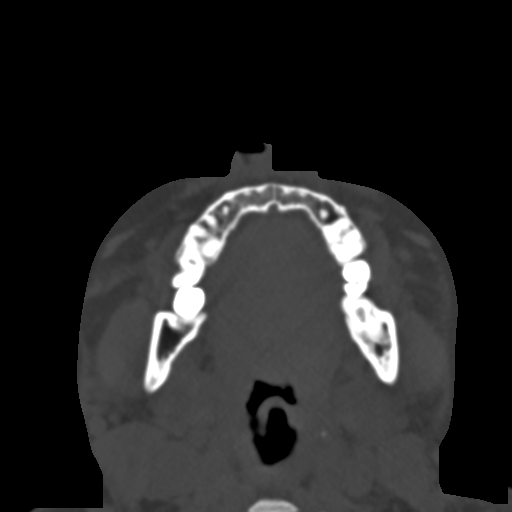
[im 38/66  brain]
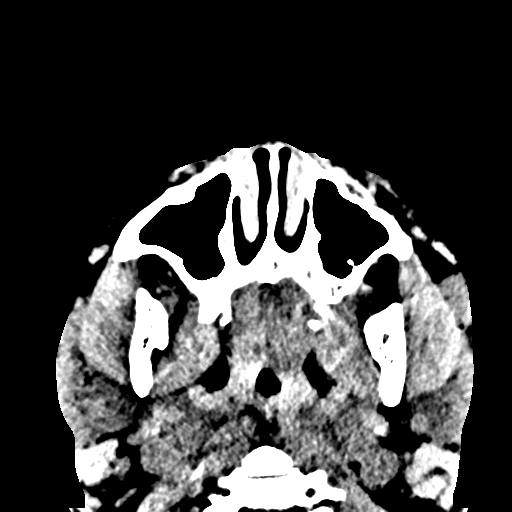
[im 44/66  brain]
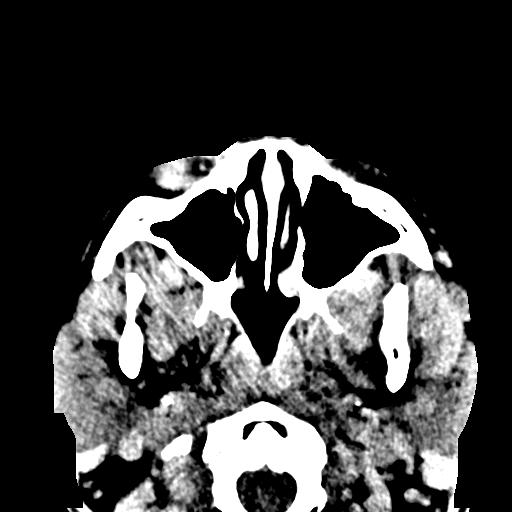
[im 49/66  brain]
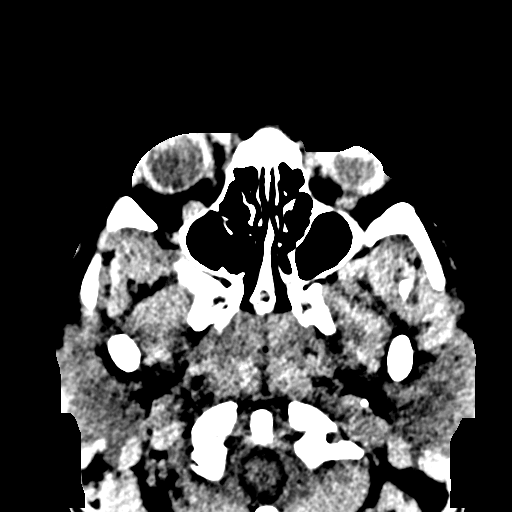
[im 55/66  brain]
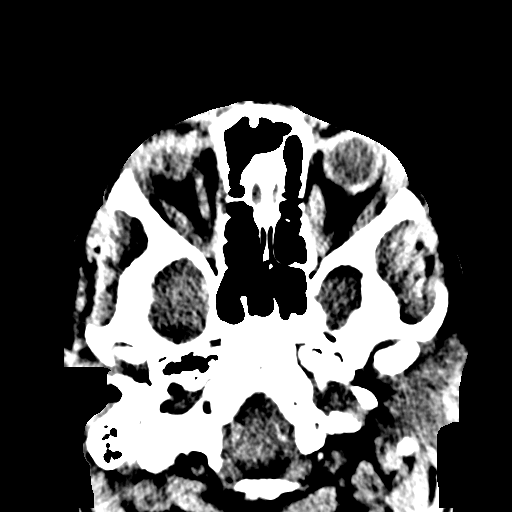
[im 55/66  bone]
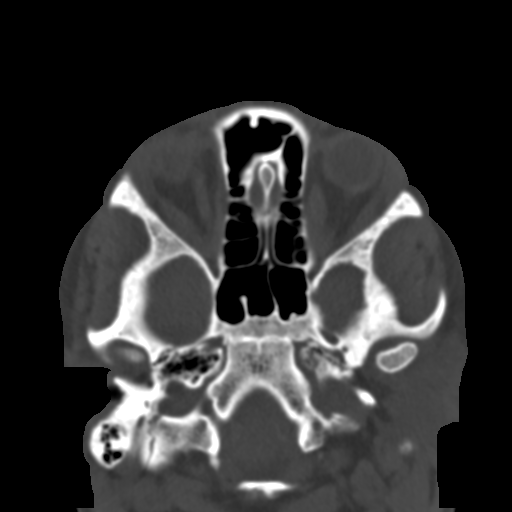
[im 60/66  brain]
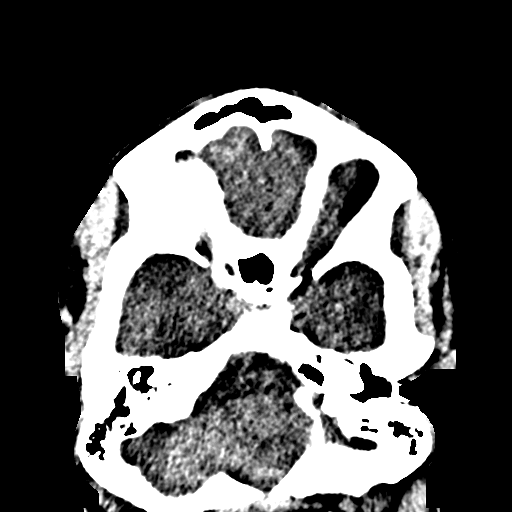

[Series 8: coronal soft · coronal · 0.30mm/px · 3 of 69 slices shown]
[im 23/69  brain]
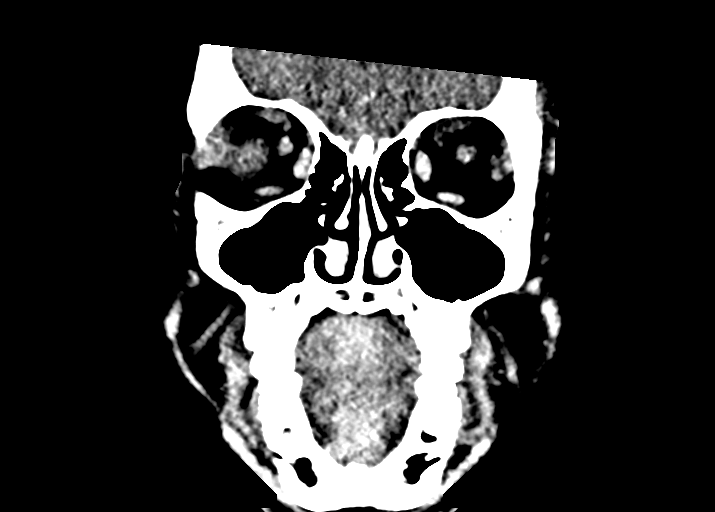
[im 31/69  brain]
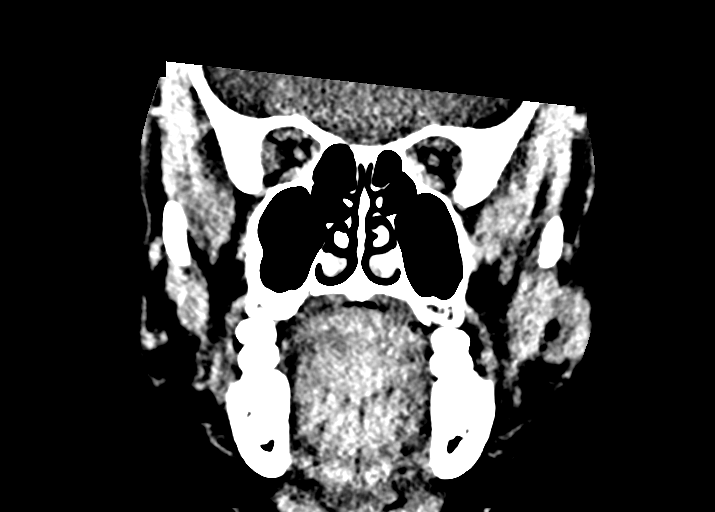
[im 38/69  brain]
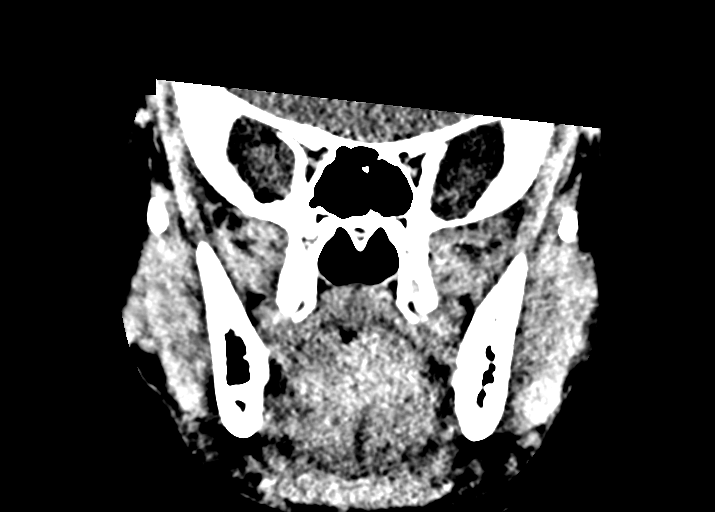

[Series 9: sagittal soft · sagittal · 0.33mm/px · 2 of 81 slices shown]
[im 27/81  brain]
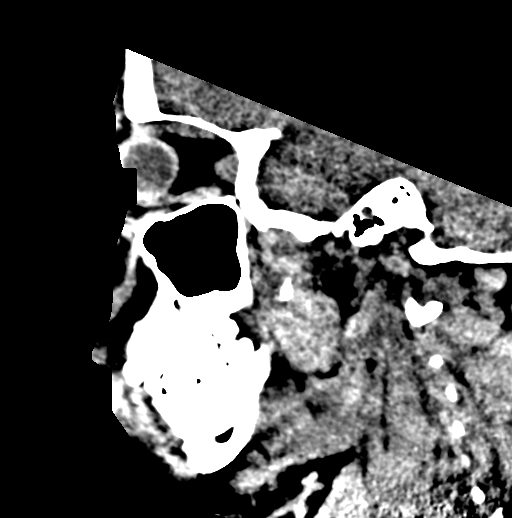
[im 54/81  brain]
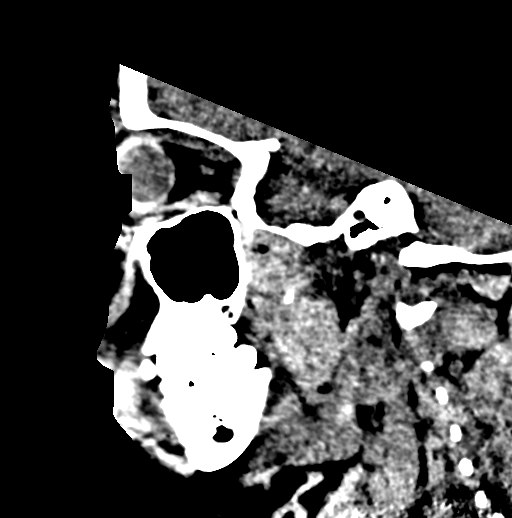

[15 of 47 positions shown; findings below may reference images not displayed]

FINDINGS: CT HEAD FINDINGS

The ventricles are normal in size and configuration.

There are no parenchymal masses or mass effect. There are bilateral
basal ganglia calcifications. Small calcifications in noted in the
right frontal lobe. There is no evidence of an infarct.

There are no extra-axial masses or abnormal fluid collections.

No intracranial hemorrhage.

No skull fracture.

CT MAXILLOFACIAL FINDINGS

No fractures.

Normal globes and orbits. Sinuses, mastoid air cells and middle ear
cavities are clear.

No soft tissue masses or adenopathy.
IMPRESSION: HEAD CT:  No acute intracranial abnormality.  No skull fracture.

MAXILLOFACIAL CT:  No fracture or acute finding.

## 2017-12-24 ENCOUNTER — Emergency Department (HOSPITAL_COMMUNITY)
Admission: EM | Admit: 2017-12-24 | Discharge: 2017-12-24 | Disposition: A | Discharge status: Home / Self Care | Payer: Medicare Other | Attending: Emergency Medicine | Admitting: Emergency Medicine

## 2017-12-24 ENCOUNTER — Encounter (HOSPITAL_COMMUNITY): Payer: Self-pay | Admitting: Emergency Medicine

## 2017-12-24 ENCOUNTER — Emergency Department (HOSPITAL_COMMUNITY): Payer: Medicare Other

## 2017-12-24 DIAGNOSIS — Z7901 Long term (current) use of anticoagulants: Secondary | ICD-10-CM | POA: Insufficient documentation

## 2017-12-24 DIAGNOSIS — Z992 Dependence on renal dialysis: Secondary | ICD-10-CM | POA: Diagnosis not present

## 2017-12-24 DIAGNOSIS — N186 End stage renal disease: Secondary | ICD-10-CM | POA: Diagnosis not present

## 2017-12-24 DIAGNOSIS — R55 Syncope and collapse: Secondary | ICD-10-CM

## 2017-12-24 DIAGNOSIS — Z91040 Latex allergy status: Secondary | ICD-10-CM | POA: Insufficient documentation

## 2017-12-24 DIAGNOSIS — Z79899 Other long term (current) drug therapy: Secondary | ICD-10-CM | POA: Diagnosis not present

## 2017-12-24 DIAGNOSIS — R0789 Other chest pain: Secondary | ICD-10-CM | POA: Diagnosis not present

## 2017-12-24 DIAGNOSIS — I120 Hypertensive chronic kidney disease with stage 5 chronic kidney disease or end stage renal disease: Secondary | ICD-10-CM | POA: Insufficient documentation

## 2017-12-24 LAB — I-STAT BETA HCG BLOOD, ED (MC, WL, AP ONLY)

## 2017-12-24 LAB — BASIC METABOLIC PANEL
ANION GAP: 14 (ref 5–15)
BUN: 13 mg/dL (ref 6–20)
CALCIUM: 9.1 mg/dL (ref 8.9–10.3)
CO2: 25 mmol/L (ref 22–32)
Chloride: 99 mmol/L (ref 98–111)
Creatinine, Ser: 5.46 mg/dL — ABNORMAL HIGH (ref 0.44–1.00)
GFR calc Af Amer: 11 mL/min — ABNORMAL LOW (ref >60)
GFR, EST NON AFRICAN AMERICAN: 10 mL/min — AB (ref >60)
GLUCOSE: 80 mg/dL (ref 70–99)
Potassium: 4.3 mmol/L (ref 3.5–5.1)
SODIUM: 138 mmol/L (ref 135–145)

## 2017-12-24 LAB — PROTIME-INR
INR: 1.65
Prothrombin Time: 19.4 seconds — ABNORMAL HIGH (ref 11.4–15.2)

## 2017-12-24 LAB — CBC
HCT: 34.3 % — ABNORMAL LOW (ref 36.0–46.0)
HEMOGLOBIN: 10.2 g/dL — AB (ref 12.0–15.0)
MCH: 29 pg (ref 26.0–34.0)
MCHC: 29.7 g/dL — AB (ref 30.0–36.0)
MCV: 97.4 fL (ref 78.0–100.0)
Platelets: 185 10*3/uL (ref 150–400)
RBC: 3.52 MIL/uL — ABNORMAL LOW (ref 3.87–5.11)
RDW: 20.7 % — ABNORMAL HIGH (ref 11.5–15.5)
WBC: 1.9 10*3/uL — AB (ref 4.0–10.5)

## 2017-12-24 LAB — I-STAT TROPONIN, ED: Troponin i, poc: 0.01 ng/mL (ref 0.00–0.08)

## 2017-12-24 LAB — CBG MONITORING, ED: GLUCOSE-CAPILLARY: 75 mg/dL (ref 70–99)

## 2017-12-24 MED ORDER — IOPAMIDOL (ISOVUE-370) INJECTION 76%
INTRAVENOUS | Status: AC
  Filled 2017-12-24: qty 100

## 2017-12-24 MED ORDER — SODIUM CHLORIDE 0.9 % IV BOLUS
250.0000 mL | Freq: Once | INTRAVENOUS | Status: AC
  Administered 2017-12-24: 250 mL via INTRAVENOUS

## 2017-12-24 MED ORDER — MORPHINE SULFATE (PF) 4 MG/ML IV SOLN
4.0000 mg | Freq: Once | INTRAVENOUS | Status: AC
  Administered 2017-12-24: 4 mg via INTRAVENOUS
  Filled 2017-12-24: qty 1

## 2017-12-24 MED ORDER — ONDANSETRON HCL 4 MG/2ML IJ SOLN
4.0000 mg | Freq: Once | INTRAMUSCULAR | Status: AC
  Administered 2017-12-24: 4 mg via INTRAVENOUS
  Filled 2017-12-24: qty 2

## 2017-12-24 MED ORDER — IOPAMIDOL (ISOVUE-370) INJECTION 76%
100.0000 mL | Freq: Once | INTRAVENOUS | Status: AC | PRN
  Administered 2017-12-24: 100 mL via INTRAVENOUS

## 2017-12-24 NOTE — ED Notes (Signed)
Notified MD pt request for pain medication

## 2017-12-24 NOTE — ED Provider Notes (Signed)
[facility]ER[facility] DEPARTMENT Provider Note   CSN: [id number] Arrival date & time: 12/24/17  1112     History   Chief Complaint Chief Complaint  Patient presents with  . Loss of Consciousness  . Chest Pain    HPI Crystal Dunlap is a 27 y.o. female.  Pt presents to the ED today with near syncope and cp.  The pt is a dialysis pt (MWF) and had her full dialysis today with the exception of 15 minutes.  She felt bad towards the end, so it was stopped a little early.  When she got home, she felt like she was going to pass out, but did not completely.  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  Pt was no longer a candidate for PD due to the adhesions in her abdomen.  She has been on HD since 2010.  She has had 2 UE DVTs and PE in 2015.  She has been on coumadin since then.  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  Anticoagulation recommended for lifetime.         Past Medical History:  Diagnosis Date  . Anemia   . ESRD (end stage renal disease) ([place]) Feb. 12, 2010   First date of Dialysis   . ESRD (end stage renal disease) on dialysis ([place])    "MWF; [facility]; [place]" (11/23/2016)  . Focal segmental glomerulosclerosis   . GERD (gastroesophageal reflux disease)   . Hyperlipidemia   . Hypertension   . Pulmonary emboli ([place]) ~ 2013  . Secondary hyperparathyroidism ([place])   . SIRS (systemic inflammatory response syndrome) ([place]) 11/23/2016    Patient Active Problem List   Diagnosis Date Noted  . Acute upper respiratory infection   . Multifocal pneumonia 11/25/2016  . FUO (fever of unknown  origin) 11/23/2016  . Sepsis due to undetermined organism ([place]) 11/23/2016  . History of DVT (deep vein thrombosis) 11/23/2016  . Anemia in ESRD (end-stage renal disease) ([place]) 11/23/2016  . Thrombocytopenia ([place]) 11/23/2016  . Essential hypertension 11/23/2016  . Respiratory failure ([place])   . Allergic reaction 08/09/2015  . Anaphylaxis 08/09/2015  . 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 End stage renal disease on dialysis ([place]) 08/09/2015  . Acute respiratory failure with hypoxia ([place]) 08/09/2015    Past Surgical History:  Procedure Laterality Date  . COLON SURGERY  ~ 2011/2012   "for bowel obstruction"  . INSERTION OF DIALYSIS CATHETER Left ~ 2016   chest  . KIDNEY TRANSPLANT  2007     OB History   None      Home Medications    Prior to Admission medications   Medication Sig Start Date End Date Taking? Authorizing Provider  amLODipine (NORVASC) 10 MG tablet Take 10 mg by mouth daily. 07/06/16   [provider]  calcium acetate (PHOSLO) 667 MG capsule Take 1,334 mg by mouth 3 (three) times daily with meals.     [provider]  Cholecalciferol (VITAMIN D3) 2000 units capsule Take 2,000 Units by mouth daily. 05/08/16   [provider]  diclofenac sodium (VOLTAREN) 1 %  GEL Apply 4 g topically 4 (four) times daily. 12/28/16   Palumbo, April, MD  diphenhydrAMINE (BENADRYL) 25 mg capsule Take 1 capsule (25 mg total) by mouth every 6 (six) hours as needed for allergies. 08/14/15   Ghimire, Henreitta Leber, MD  EPINEPHrine 0.3 mg/0.3 mL IJ SOAJ injection Inject 0.3 mLs (0.3 mg total) into the muscle once. Patient taking differently: Inject 0.3 mg into the muscle once as needed (FOR ANAPHYLAXIS).  08/16/15   Vira Blanco, MD  folic acid (FOLVITE) 1 MG tablet Take 1 mg by mouth daily.    [provider]  hydrALAZINE (APRESOLINE) 25 MG tablet Take 25 mg by mouth 3 (three) times daily. 11/22/16   [provider]  multivitamin (RENA-VIT) TABS tablet Take 1  tablet by mouth daily. 02/12/15   [provider]  ranitidine (ZANTAC) 300 MG capsule Take 300 mg by mouth every evening.    [provider]  rosuvastatin (CRESTOR) 10 MG tablet Take 10 mg by mouth daily.    [provider]  traMADol (ULTRAM) 50 MG tablet Take 1 tablet (50 mg total) by mouth every 12 (twelve) hours as needed. 12/28/16   Palumbo, April, MD  warfarin (COUMADIN) 1 MG tablet Take 3.5 mg by mouth at bedtime. IN CONJUNCTION WITH ONE 5 MG TABLET TO EQUAL A TOTAL DOSAGE OF 8.5 MILLIGRAMS    [provider]  warfarin (COUMADIN) 5 MG tablet Take 5 mg by mouth at bedtime. IN CONJUNCTION WITH THREE AND ONE-HALF 1 MG TABLETS TO EQUAL A TOTAL DOSAGE OF 8.5 MILLIGRAMS    [provider]    Family History History reviewed. No pertinent family history.  Social History Social History   Tobacco Use  . Smoking status: Never Smoker  . Smokeless tobacco: Never Used  Substance Use Topics  . Alcohol use: No  . Drug use: No     Allergies   Cefazolin; Ferrlecit [na ferric gluc cplx in sucrose]; Cyclosporine; Lactose intolerance (gi); Latex; and Vancomycin   Review of Systems Review of Systems  Cardiovascular: Positive for chest pain.  Neurological: Positive for dizziness and weakness.  All other systems reviewed and are negative.    Physical Exam Updated Vital Signs BP (!) 92/53 (BP Location: Right Arm)   Pulse 64   Temp 99.5 F (37.5 C) (Oral)   Resp 17   Ht 5\' 2"  (1.575 m)   Wt 46.6 kg (102 lb 11.8 oz)   LMP  (LMP Unknown)   SpO2 97%   BMI 18.79 kg/m   Physical Exam  Constitutional: She is oriented to person, place, and time. She appears well-developed and well-nourished.  HENT:  Head: Normocephalic and atraumatic.  Eyes: Pupils are equal, round, and reactive to light. EOM are normal.  Neck: Normal range of motion. Neck supple.  Cardiovascular: Normal rate, regular rhythm, intact distal pulses and normal pulses.    Pulmonary/Chest: Effort normal and breath sounds normal.  Abdominal: Soft. Bowel sounds are normal.  Musculoskeletal: Normal range of motion.       Right lower leg: Normal.       Left lower leg: Normal.  Neurological: She is alert and oriented to person, place, and time.  Skin: Skin is warm. Capillary refill takes less than 2 seconds.  Psychiatric: She has a normal mood and affect. Her behavior is normal.  Nursing note and vitals reviewed.    ED Treatments / Results  Labs (all labs ordered are listed, but only abnormal results are displayed) Labs Reviewed  BASIC METABOLIC PANEL - Abnormal; Notable for the following components:      Result Value   Creatinine, Ser 5.46 (*)    GFR calc non Af Amer 10 (*)    GFR calc Af Amer 11 (*)    All other components within normal limits  CBC - Abnormal; Notable for the following components:   WBC 1.9 (*)    RBC 3.52 (*)    Hemoglobin 10.2 (*)    HCT 34.3 (*)    MCHC 29.7 (*)    RDW 20.7 (*)    All other components within normal limits  PROTIME-INR - Abnormal; Notable for the following components:   Prothrombin Time 19.4 (*)    All other components within normal limits  URINALYSIS, ROUTINE W REFLEX MICROSCOPIC  CBG MONITORING, ED  I-STAT BETA HCG BLOOD, ED ([facility], [facility], [facility] ONLY)  I-STAT TROPONIN, ED    EKG EKG Interpretation  Date/Time:  Friday December 24 2017 11:13:56 EDT Ventricular Rate:  82 PR Interval:    QRS Duration: 93 QT Interval:  399 QTC Calculation: 466 R Axis:   75 Text Interpretation:  Sinus rhythm Biatrial enlargement No significant change since last tracing Confirmed by Isla Pence [phone]) on 12/24/2017 12:22:49 PM   Radiology Dg Chest 2 View  Result Date: 12/24/2017 CLINICAL DATA:  Chest pain.  Weakness.  Dizziness. EXAM: CHEST - 2 VIEW COMPARISON:  CT 11/24/2016.  Chest x-ray 11/23/2016. FINDINGS: Left IJ line and accompanying vascular stent noted in stable position. Heart size stable. No focal infiltrate. Nipple  shadows again noted. No pleural effusion or pneumothorax. No acute bony abnormality. Surgical clips upper abdomen. IMPRESSION: Left IJ catheter and central vascular stent in stable position. No acute cardiopulmonary disease. Chest is stable from prior exams. Electronically Signed   By: Marcello Moores  Register   On: 12/24/2017 12:18   Ct Angio Chest Pe W And/or Wo Contrast  Result Date: 12/24/2017 CLINICAL DATA:  Weak and dizzy post dialysis, left chest pain, hypertension EXAM: CT ANGIOGRAPHY CHEST WITH CONTRAST TECHNIQUE: Multidetector CT imaging of the chest was performed using the standard protocol during bolus administration of intravenous contrast. Multiplanar CT image reconstructions and MIPs were obtained to evaluate the vascular anatomy. CONTRAST:  85 mL ISOVUE-370 IOPAMIDOL (ISOVUE-370) INJECTION 76% COMPARISON:  11/24/2016 FINDINGS: Cardiovascular: Tunneled left IJ hemodialysis catheter. Patent venous stent extending from the central aspect of the innominate vein into the proximal SVC. There is intraluminal fibrin sheath or thrombus adjacent to the hemodialysis catheter resulting in near complete occlusion of the distal SVC, with retrograde flow into the azygos system opacifying multiple mediastinal, body wall, and upper abdominal collateral pathways. The tip of the catheter is at the cavoatrial junction. Heart size normal. Trace pericardial effusion. There is fair contrast opacification of pulmonary artery branches. 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 No definite acute pulmonary emboli. Adequate contrast opacification of the thoracic aorta with no evidence of dissection, aneurysm, or stenosis. There is classic 3-vessel brachiocephalic arch anatomy without proximal stenosis. Mediastinum/Nodes: No definite hilar or mediastinal adenopathy. Lungs/Pleura: No pleural effusion. No pneumothorax. Lungs are clear. Upper Abdomen: Extensive atypical enhancement due to venous collateral  channels. No acute findings. Musculoskeletal: No chest wall abnormality. Mild thoracic dextroscoliosis without evident underlying vertebral anomaly. No acute or significant osseous findings. Review of the MIP images confirms the above findings. IMPRESSION: 1. Negative for acute PE. 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 2. 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  Electronically Signed   By: Lucrezia Europe M.D.   On: 12/24/2017 16:11    Procedures Procedures (including critical care time)  Medications Ordered in ED Medications  sodium chloride 0.9 % bolus 250 mL (0 mLs Intravenous Stopped 12/24/17 1430)  ondansetron (ZOFRAN) injection 4 mg (4 mg Intravenous Given 12/24/17 1342)  morphine 4 MG/ML injection 4 mg (4 mg Intravenous Given 12/24/17 1343)  morphine 4 MG/ML injection 4 mg (4 mg Intravenous Given 12/24/17 1435)  iopamidol (ISOVUE-370) 76 % injection 100 mL (100 mLs Intravenous Contrast Given 12/24/17 1533)  sodium chloride 0.9 % bolus 250 mL (0 mLs Intravenous Stopped 12/24/17 1800)     Initial Impression / Assessment and Plan / ED Course  I have reviewed the triage vital signs and the nursing notes.  Pertinent labs & imaging results that were available during my care of the patient were reviewed by me and considered in my medical decision making (see chart for details).  CT angio chest done to r/o pe b/c she has a hx of pe and inr was not therapeutic (it had been high, so she had been holding it).  No PE.   Pt feels much better.  Pt given very gentle hydration as she is not due for dialysis again until Monday and has no kidneys.  She was able to ambulate and feels back to normal.  Pt is stable for d/c.  Return if worse.  CRITICAL CARE Performed by: Isla Pence   Total critical care time: 30 minutes  Critical care time was exclusive of separately billable procedures and treating other patients.  Critical care was necessary to treat or prevent imminent or  life-threatening deterioration.  Critical care was time spent personally by me on the following activities: development of treatment plan with patient and/or surrogate as well as nursing, discussions with consultants, evaluation of patient's response to treatment, examination of patient, obtaining history from patient or surrogate, ordering and performing treatments and interventions, ordering and review of laboratory studies, ordering and review of radiographic studies, pulse oximetry and re-evaluation of patient's condition.  Final Clinical Impressions(s) / ED Diagnoses   Final diagnoses:  Near syncope  ESRD on hemodialysis [facility])  Atypical chest pain    ED Discharge Orders    None       Isla Pence, MD 12/24/17 1811

## 2017-12-24 NOTE — ED Notes (Signed)
IV team at bedside 

## 2017-12-24 NOTE — ED Notes (Signed)
Pt alert and oriented in NAD. Pt verbalized understanding of discharge instructions. 

## 2017-12-24 NOTE — ED Notes (Signed)
Pt returned from CT °

## 2017-12-24 NOTE — ED Notes (Signed)
Patient transported to CT 

## 2017-12-24 NOTE — ED Notes (Signed)
Pt does not produce urine

## 2017-12-24 NOTE — ED Notes (Signed)
Pt ambulating in hall without difficulty, steady gait.

## 2017-12-24 NOTE — ED Notes (Signed)
Patient transported to X-ray 

## 2017-12-24 NOTE — ED Triage Notes (Signed)
Pt had dialysis today and removed 3.2 kg. Pt felt weak and dizzy when arriving at home. She then began having left central chest pain  Pt give 324 mg ASA. She had kidney xplants in 1994 and 2007.

## 2018-06-23 ENCOUNTER — Emergency Department (HOSPITAL_BASED_OUTPATIENT_CLINIC_OR_DEPARTMENT_OTHER): Payer: Medicare Other

## 2018-06-23 ENCOUNTER — Encounter (HOSPITAL_BASED_OUTPATIENT_CLINIC_OR_DEPARTMENT_OTHER): Payer: Self-pay | Admitting: Emergency Medicine

## 2018-06-23 ENCOUNTER — Other Ambulatory Visit: Payer: Self-pay

## 2018-06-23 ENCOUNTER — Emergency Department (HOSPITAL_BASED_OUTPATIENT_CLINIC_OR_DEPARTMENT_OTHER)
Admission: EM | Admit: 2018-06-23 | Discharge: 2018-06-23 | Disposition: A | Discharge status: Home / Self Care | Payer: Medicare Other | Attending: Emergency Medicine | Admitting: Emergency Medicine

## 2018-06-23 DIAGNOSIS — J069 Acute upper respiratory infection, unspecified: Secondary | ICD-10-CM | POA: Diagnosis not present

## 2018-06-23 DIAGNOSIS — J189 Pneumonia, unspecified organism: Secondary | ICD-10-CM | POA: Diagnosis not present

## 2018-06-23 DIAGNOSIS — B9789 Other viral agents as the cause of diseases classified elsewhere: Secondary | ICD-10-CM | POA: Diagnosis not present

## 2018-06-23 DIAGNOSIS — I120 Hypertensive chronic kidney disease with stage 5 chronic kidney disease or end stage renal disease: Secondary | ICD-10-CM | POA: Insufficient documentation

## 2018-06-23 DIAGNOSIS — Z91040 Latex allergy status: Secondary | ICD-10-CM | POA: Diagnosis not present

## 2018-06-23 DIAGNOSIS — N186 End stage renal disease: Secondary | ICD-10-CM | POA: Insufficient documentation

## 2018-06-23 DIAGNOSIS — Z79899 Other long term (current) drug therapy: Secondary | ICD-10-CM | POA: Insufficient documentation

## 2018-06-23 DIAGNOSIS — Z7901 Long term (current) use of anticoagulants: Secondary | ICD-10-CM | POA: Diagnosis not present

## 2018-06-23 DIAGNOSIS — R05 Cough: Secondary | ICD-10-CM | POA: Diagnosis present

## 2018-06-23 LAB — CBC
HEMATOCRIT: 35.1 % — AB (ref 36.0–46.0)
HEMOGLOBIN: 10.5 g/dL — AB (ref 12.0–15.0)
MCH: 29.6 pg (ref 26.0–34.0)
MCHC: 29.9 g/dL — ABNORMAL LOW (ref 30.0–36.0)
MCV: 98.9 fL (ref 80.0–100.0)
NRBC: 0.4 % — AB (ref 0.0–0.2)
Platelets: 152 10*3/uL (ref 150–400)
RBC: 3.55 MIL/uL — AB (ref 3.87–5.11)
RDW: 21.1 % — ABNORMAL HIGH (ref 11.5–15.5)
WBC: 5.6 10*3/uL (ref 4.0–10.5)

## 2018-06-23 LAB — I-STAT CHEM 8, ED
BUN: 53 mg/dL — AB (ref 6–20)
CHLORIDE: 100 mmol/L (ref 98–111)
CREATININE: 12.6 mg/dL — AB (ref 0.44–1.00)
Calcium, Ion: 1.03 mmol/L — ABNORMAL LOW (ref 1.15–1.40)
Glucose, Bld: 85 mg/dL (ref 70–99)
HEMATOCRIT: 35 % — AB (ref 36.0–46.0)
Hemoglobin: 11.9 g/dL — ABNORMAL LOW (ref 12.0–15.0)
POTASSIUM: 4.5 mmol/L (ref 3.5–5.1)
Sodium: 137 mmol/L (ref 135–145)
TCO2: 26 mmol/L (ref 22–32)

## 2018-06-23 LAB — I-STAT CG4 LACTIC ACID, ED: LACTIC ACID, VENOUS: 2.57 mmol/L — AB (ref 0.5–1.9)

## 2018-06-23 LAB — TROPONIN I: Troponin I: <0.03 ng/mL (ref <0.03)

## 2018-06-23 LAB — PROTIME-INR
INR: 1.94
Prothrombin Time: 21.9 seconds — ABNORMAL HIGH (ref 11.4–15.2)

## 2018-06-23 MED ORDER — SODIUM CHLORIDE 0.9 % IV SOLN
INTRAVENOUS | Status: DC | PRN
  Administered 2018-06-23: 12:00:00 via INTRAVENOUS

## 2018-06-23 MED ORDER — HYDROCODONE-ACETAMINOPHEN 5-325 MG PO TABS
1.0000 | ORAL_TABLET | Freq: Once | ORAL | Status: AC
  Administered 2018-06-23: 1 via ORAL
  Filled 2018-06-23: qty 1

## 2018-06-23 MED ORDER — DOXYCYCLINE HYCLATE 100 MG IV SOLR
INTRAVENOUS | Status: AC
  Filled 2018-06-23: qty 100

## 2018-06-23 MED ORDER — SODIUM CHLORIDE 0.9 % IV SOLN
100.0000 mg | Freq: Once | INTRAVENOUS | Status: AC
  Administered 2018-06-23: 100 mg via INTRAVENOUS
  Filled 2018-06-23: qty 100

## 2018-06-23 MED ORDER — LIDOCAINE 5 % EX PTCH: 1.0000 | MEDICATED_PATCH | CUTANEOUS | 0 refills | Status: AC

## 2018-06-23 MED ORDER — DOXYCYCLINE HYCLATE 100 MG PO CAPS: 100.0000 mg | ORAL_CAPSULE | Freq: Two times a day (BID) | ORAL | 0 refills | Status: AC

## 2018-06-23 MED ORDER — SODIUM CHLORIDE 0.9 % IV BOLUS
250.0000 mL | Freq: Once | INTRAVENOUS | Status: AC
  Administered 2018-06-23: 250 mL via INTRAVENOUS

## 2018-06-23 NOTE — ED Notes (Signed)
Pt went back to sleep.

## 2018-06-23 NOTE — ED Triage Notes (Addendum)
Cough x 2 weeks, worsening last night. Chest pain started last night. Hx of renal failure with last dialysis on Tuesday.

## 2018-06-23 NOTE — ED Notes (Signed)
One BC obtained per verbal order.

## 2018-06-23 NOTE — ED Notes (Signed)
Pt will not be able to produce urine for urine preg as she is on dialysis. States her chest hurts and she needs pain medication. States she can only take Dilaudid or Tylenol.

## 2018-06-23 NOTE — ED Provider Notes (Signed)
[facility] EMERGENCY DEPARTMENT Provider Note   CSN: [id number] Arrival date & time: 06/23/18  1035     History   Chief Complaint Chief Complaint  Patient presents with  . Cough  . Chest Pain    HPI Crystal Dunlap is a 27 y.o. female.  The history is provided by the patient.  Cough  This is a new problem. The current episode started 2 days ago. The problem occurs constantly. The problem has not changed since onset.The cough is productive of sputum. There has been no fever. The fever has been present for less than 1 day. Associated symptoms include chest pain, chills and myalgias. Pertinent negatives include no sweats, no weight loss, no ear congestion, no ear pain, no sore throat, no shortness of breath, no wheezing and no eye redness. Treatments tried: tylenol. The treatment provided mild relief. She is not a smoker. Past medical history comments: ESRD on HD.    Past Medical History:  Diagnosis Date  . Anemia   . ESRD (end stage renal disease) ([place]) Feb. 12, 2010   First date of Dialysis   . ESRD (end stage renal disease) on dialysis ([place])    "MWF; [facility]; [place]" (11/23/2016)  . Focal segmental glomerulosclerosis   . GERD (gastroesophageal reflux disease)   . Hyperlipidemia   . Hypertension   . Pulmonary emboli ([place]) ~ 2013  . Secondary hyperparathyroidism ([place])   . SIRS (systemic inflammatory response syndrome) ([place]) 11/23/2016    Patient Active Problem List   Diagnosis Date Noted  . Acute upper respiratory infection   . Multifocal pneumonia 11/25/2016  . FUO (fever of unknown origin) 11/23/2016  . Sepsis due to undetermined organism ([place]) 11/23/2016  . History of DVT (deep vein thrombosis) 11/23/2016  . Anemia in ESRD (end-stage renal disease) ([place]) 11/23/2016  . Thrombocytopenia ([place]) 11/23/2016  . Essential hypertension 11/23/2016  . Respiratory failure ([place])   . Allergic reaction 08/09/2015  . Anaphylaxis 08/09/2015  . Allergic angioedema  08/09/2015  . End stage renal disease on dialysis ([place]) 08/09/2015  . Acute respiratory failure with hypoxia ([place]) 08/09/2015    Past Surgical History:  Procedure Laterality Date  . COLON SURGERY  ~ 2011/2012   "for bowel obstruction"  . INSERTION OF DIALYSIS CATHETER Left ~ 2016   chest  . KIDNEY TRANSPLANT  2007     OB History   No obstetric history on file.      Home Medications    Prior to Admission medications   Medication Sig Start Date End Date Taking? Authorizing Provider  losartan (COZAAR) 100 MG tablet Take 100 mg by mouth daily.   Yes [provider]  sevelamer (RENAGEL) 800 MG tablet Take 800 mg by mouth 3 (three) times daily with meals.   Yes [provider]  amLODipine (NORVASC) 10 MG tablet Take 10 mg by mouth daily. 07/06/16   [provider]  calcium acetate (PHOSLO) 667 MG capsule Take 1,334 mg by mouth 3 (three) times daily with meals.     [provider]  Cholecalciferol (VITAMIN D3) 2000 units capsule Take 2,000 Units by mouth daily. 05/08/16   [provider]  diclofenac sodium (VOLTAREN) 1 % GEL Apply 4 g topically 4 (four) times daily. 12/28/16   Palumbo, April, MD  diphenhydrAMINE (BENADRYL) 25 mg capsule Take 1 capsule (25 mg total) by mouth every 6 (six) hours as needed for allergies. 08/14/15   Ghimire, Henreitta Leber, MD  doxycycline (VIBRAMYCIN) 100 MG capsule Take  1 capsule (100 mg total) by mouth 2 (two) times daily for 10 days. 06/23/18 07/03/18  Kashara Blocher, DO  EPINEPHrine 0.3 mg/0.3 mL IJ SOAJ injection Inject 0.3 mLs (0.3 mg total) into the muscle once. Patient taking differently: Inject 0.3 mg into the muscle once as needed (FOR ANAPHYLAXIS).  08/16/15   Vira Blanco, MD  folic acid (FOLVITE) 1 MG tablet Take 1 mg by mouth daily.    [provider]  hydrALAZINE (APRESOLINE) 25 MG tablet Take 25 mg by mouth 3 (three) times daily. 11/22/16   [provider]  lidocaine (LIDODERM) 5 % Place 1  patch onto the skin daily for 10 doses. Remove & Discard patch within 12 hours or as directed by MD 06/23/18 07/03/18  Lennice Sites, DO  multivitamin (RENA-VIT) TABS tablet Take 1 tablet by mouth daily. 02/12/15   [provider]  ranitidine (ZANTAC) 300 MG capsule Take 300 mg by mouth every evening.    [provider]  rosuvastatin (CRESTOR) 10 MG tablet Take 10 mg by mouth daily.    [provider]  traMADol (ULTRAM) 50 MG tablet Take 1 tablet (50 mg total) by mouth every 12 (twelve) hours as needed. 12/28/16   Palumbo, April, MD  warfarin (COUMADIN) 1 MG tablet Take 3.5 mg by mouth at bedtime. IN CONJUNCTION WITH ONE 5 MG TABLET TO EQUAL A TOTAL DOSAGE OF 8.5 MILLIGRAMS    [provider]  warfarin (COUMADIN) 5 MG tablet Take 5 mg by mouth at bedtime. IN CONJUNCTION WITH THREE AND ONE-HALF 1 MG TABLETS TO EQUAL A TOTAL DOSAGE OF 8.5 MILLIGRAMS    [provider]    Family History No family history on file.  Social History Social History   Tobacco Use  . Smoking status: Never Smoker  . Smokeless tobacco: Never Used  Substance Use Topics  . Alcohol use: No  . Drug use: No     Allergies   Cefazolin; Ferrlecit [na ferric gluc cplx in sucrose]; Cyclosporine; Lactose intolerance (gi); Latex; and Vancomycin   Review of Systems Review of Systems  Constitutional: Positive for chills. Negative for fever and weight loss.  HENT: Negative for ear pain and sore throat.   Eyes: Negative for pain, redness and visual disturbance.  Respiratory: Positive for cough. Negative for shortness of breath and wheezing.   Cardiovascular: Positive for chest pain. Negative for palpitations.  Gastrointestinal: Negative for abdominal pain and vomiting.  Genitourinary: Negative for dysuria and hematuria.  Musculoskeletal: Positive for myalgias. Negative for arthralgias and back pain.  Skin: Negative for color change and rash.  Neurological: Negative for seizures and  syncope.  All other systems reviewed and are negative.    Physical Exam Updated Vital Signs  ED Triage Vitals  Enc Vitals Group     BP 06/23/18 1040 (!) 83/62     Pulse Rate 06/23/18 1040 82     Resp 06/23/18 1040 18     Temp 06/23/18 1040 98.9 F (37.2 C)     Temp Source 06/23/18 1040 Oral     SpO2 06/23/18 1040 99 %     Weight 06/23/18 1039 101 lb 6.6 oz (46 kg)     Height 06/23/18 1039 5\' 2"  (1.575 m)     Head Circumference --      Peak Flow --      Pain Score 06/23/18 1039 10     Pain Loc --      Pain Edu? --  Excl. in [place]? --     Physical Exam Vitals signs and nursing note reviewed.  Constitutional:      General: She is not in acute distress.    Appearance: She is well-developed.  HENT:     Head: Normocephalic and atraumatic.  Eyes:     Extraocular Movements: Extraocular movements intact.     Conjunctiva/sclera: Conjunctivae normal.     Pupils: Pupils are equal, round, and reactive to light.  Neck:     Musculoskeletal: Normal range of motion and neck supple.  Cardiovascular:     Rate and Rhythm: Normal rate and regular rhythm.     Pulses:          Radial pulses are 2+ on the right side and 2+ on the left side.     Heart sounds: Normal heart sounds. No murmur.  Pulmonary:     Effort: Pulmonary effort is normal. No respiratory distress.     Breath sounds: Decreased breath sounds present. No wheezing, rhonchi or rales.     Comments: Coarse breath sounds throughout Chest:     Chest wall: No edema.  Abdominal:     Palpations: Abdomen is soft.     Tenderness: There is no abdominal tenderness.  Musculoskeletal: Normal range of motion.     Right lower leg: No edema.     Left lower leg: No edema.  Skin:    General: Skin is warm and dry.     Capillary Refill: Capillary refill takes less than 2 seconds.  Neurological:     General: No focal deficit present.     Mental Status: She is alert.      ED Treatments / Results  Labs (all labs ordered are  listed, but only abnormal results are displayed) Labs Reviewed  CBC - Abnormal; Notable for the following components:      Result Value   RBC 3.55 (*)    Hemoglobin 10.5 (*)    HCT 35.1 (*)    MCHC 29.9 (*)    RDW 21.1 (*)    nRBC 0.4 (*)    All other components within normal limits  PROTIME-INR - Abnormal; Notable for the following components:   Prothrombin Time 21.9 (*)    All other components within normal limits  I-STAT CHEM 8, ED - Abnormal; Notable for the following components:   BUN 53 (*)    Creatinine, Ser 12.60 (*)    Calcium, Ion 1.03 (*)    Hemoglobin 11.9 (*)    HCT 35.0 (*)    All other components within normal limits  I-STAT CG4 LACTIC ACID, ED - Abnormal; Notable for the following components:   Lactic Acid, Venous 2.57 (*)    All other components within normal limits  CULTURE, BLOOD (ROUTINE X 2)  CULTURE, BLOOD (ROUTINE X 2)  TROPONIN I  PREGNANCY, URINE    EKG EKG Interpretation  Date/Time:  Thursday June 23 2018 10:43:36 EST Ventricular Rate:  86 PR Interval:  142 QRS Duration: 82 QT Interval:  378 QTC Calculation: 452 R Axis:   89 Text Interpretation:  Normal sinus rhythm Normal ECG Confirmed by Lennice Sites [phone]) on 06/23/2018 11:09:48 AM   Radiology Dg Chest 2 View  Result Date: 06/23/2018 CLINICAL DATA:  Left-sided chest pain since yesterday with cough. 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 EXAM: CHEST - 2 VIEW COMPARISON:  Radiographs and CT 12/24/2017. FINDINGS: Left IJ hemodialysis catheter appears unchanged, tip at the level of  the superior cavoatrial junction. There is a stent within the left brachiocephalic vein and upper SVC. The heart size is normal. There is patchy airspace opacity along the right heart border on the frontal examination, not well seen on the lateral view, but probably in the middle lobe. The left lung is clear. There is no pleural effusion, pneumothorax  or edema. The bones appear unremarkable. IMPRESSION: Patchy right middle lobe atelectasis or early infiltrate.  No edema. Electronically Signed   By: Richardean Sale M.D.   On: 06/23/2018 11:27    Procedures Procedures (including critical care time)  Medications Ordered in ED Medications  doxycycline (VIBRAMYCIN) 100 MG injection (has no administration in time range)  0.9 %  sodium chloride infusion ( Intravenous Stopped 06/23/18 1226)  HYDROcodone-acetaminophen (NORCO/VICODIN) 5-325 MG per tablet 1 tablet (1 tablet Oral Given 06/23/18 1144)  doxycycline (VIBRAMYCIN) 100 mg in sodium chloride 0.9 % 250 mL IVPB ( Intravenous Stopped 06/23/18 1429)  sodium chloride 0.9 % bolus 250 mL ( Intravenous Stopped 06/23/18 1331)     Initial Impression / Assessment and Plan / ED Course  I have reviewed the triage vital signs and the nursing notes.  Pertinent labs & imaging results that were available during my care of the patient were reviewed by me and considered in my medical decision making (see chart for details).     Kiana Hollar is a 27 year old female with history of end-stage renal disease on hemodialysis, PE on Coumadin who presents to the ED with cough, sputum production.  Patient with blood pressure in the high 38S systolic but map greater than 65 upon arrival.  Otherwise no tachycardia, no hypoxia.  Mother states that patient currently no longer on any blood pressure medications because she has had low blood pressures for several months.  She states that she checks her blood pressure at home multiple times and her normal blood pressure is in the 50N systolic.  Patient with normal mentation on exam.  She has cough with sputum production for the last 2 days.  She states that the cough and is causing chest wall pain.  She is tender over the anterior chest wall on exam.  She has some coarse breath sounds on exam.  No signs of volume overload on exam.  Patient likely mildly dry.  Did have  dialysis this past Tuesday.  EKG shows sinus rhythm.  Troponin within normal limits.  No concern for cardiac process.  Patient with chest x-ray that shows likely developing pneumonia.  Patient given doxycycline while in the ED.  Patient was given Norco for pain.  Patient did not have any significant leukocytosis.  Lactic acid mildly elevated likely from mild dehydration.  Patient was given small normal saline bolus of 250 cc.  Vital signs are stable throughout my care.  Had discussion with patient and family about possible admission however at this time low concern for systemic infection.  Blood cultures were drawn.  They would like to try outpatient treatment.  I believe that this is reasonable given stable vitals.  Reassuring labs.  Suspect patient partly with viral process as well.  Patient given prescription for doxycycline, lidocaine patch for chest wall pain.  Told patient and family that she would need her INR rechecked fairly soon as doxycycline could increase her INR.  Patient was discharged from ED in good condition and given return precautions.  This chart was dictated using voice recognition software.  Despite best efforts to proofread,  errors can occur which can  change the documentation meaning.   Final Clinical Impressions(s) / ED Diagnoses   Final diagnoses:  Community acquired pneumonia, unspecified laterality  Viral URI with cough    ED Discharge Orders         Ordered    lidocaine (LIDODERM) 5 %  Every 24 hours     06/23/18 1436    doxycycline (VIBRAMYCIN) 100 MG capsule  2 times daily     06/23/18 1436           Christion Leonhard, Quita Skye, DO 06/23/18 1513

## 2018-06-28 LAB — CULTURE, BLOOD (ROUTINE X 2)
CULTURE: NO GROWTH
SPECIAL REQUESTS: ADEQUATE

## 2018-07-04 ENCOUNTER — Emergency Department (HOSPITAL_BASED_OUTPATIENT_CLINIC_OR_DEPARTMENT_OTHER): Payer: Medicare Other

## 2018-07-04 ENCOUNTER — Other Ambulatory Visit: Payer: Self-pay

## 2018-07-04 ENCOUNTER — Encounter (HOSPITAL_BASED_OUTPATIENT_CLINIC_OR_DEPARTMENT_OTHER): Payer: Self-pay | Admitting: Emergency Medicine

## 2018-07-04 ENCOUNTER — Observation Stay (HOSPITAL_BASED_OUTPATIENT_CLINIC_OR_DEPARTMENT_OTHER)
Admission: EM | Admit: 2018-07-04 | Discharge: 2018-07-05 | Disposition: A | Discharge status: Home / Self Care | Payer: Medicare Other | Attending: Internal Medicine | Admitting: Internal Medicine

## 2018-07-04 DIAGNOSIS — E875 Hyperkalemia: Secondary | ICD-10-CM | POA: Diagnosis present

## 2018-07-04 DIAGNOSIS — Z7901 Long term (current) use of anticoagulants: Secondary | ICD-10-CM | POA: Insufficient documentation

## 2018-07-04 DIAGNOSIS — Z86718 Personal history of other venous thrombosis and embolism: Secondary | ICD-10-CM

## 2018-07-04 DIAGNOSIS — D631 Anemia in chronic kidney disease: Secondary | ICD-10-CM | POA: Diagnosis not present

## 2018-07-04 DIAGNOSIS — N2581 Secondary hyperparathyroidism of renal origin: Secondary | ICD-10-CM | POA: Diagnosis not present

## 2018-07-04 DIAGNOSIS — R918 Other nonspecific abnormal finding of lung field: Secondary | ICD-10-CM | POA: Diagnosis not present

## 2018-07-04 DIAGNOSIS — I10 Essential (primary) hypertension: Secondary | ICD-10-CM | POA: Diagnosis present

## 2018-07-04 DIAGNOSIS — R9431 Abnormal electrocardiogram [ECG] [EKG]: Secondary | ICD-10-CM | POA: Insufficient documentation

## 2018-07-04 DIAGNOSIS — J90 Pleural effusion, not elsewhere classified: Secondary | ICD-10-CM | POA: Insufficient documentation

## 2018-07-04 DIAGNOSIS — Z940 Kidney transplant status: Secondary | ICD-10-CM | POA: Diagnosis not present

## 2018-07-04 DIAGNOSIS — E785 Hyperlipidemia, unspecified: Secondary | ICD-10-CM | POA: Insufficient documentation

## 2018-07-04 DIAGNOSIS — Z992 Dependence on renal dialysis: Secondary | ICD-10-CM | POA: Diagnosis not present

## 2018-07-04 DIAGNOSIS — Z881 Allergy status to other antibiotic agents status: Secondary | ICD-10-CM | POA: Insufficient documentation

## 2018-07-04 DIAGNOSIS — I120 Hypertensive chronic kidney disease with stage 5 chronic kidney disease or end stage renal disease: Secondary | ICD-10-CM | POA: Insufficient documentation

## 2018-07-04 DIAGNOSIS — K219 Gastro-esophageal reflux disease without esophagitis: Secondary | ICD-10-CM | POA: Diagnosis not present

## 2018-07-04 DIAGNOSIS — Z86711 Personal history of pulmonary embolism: Secondary | ICD-10-CM | POA: Diagnosis not present

## 2018-07-04 DIAGNOSIS — N186 End stage renal disease: Secondary | ICD-10-CM | POA: Diagnosis not present

## 2018-07-04 DIAGNOSIS — Z79899 Other long term (current) drug therapy: Secondary | ICD-10-CM | POA: Diagnosis not present

## 2018-07-04 DIAGNOSIS — R531 Weakness: Secondary | ICD-10-CM | POA: Diagnosis present

## 2018-07-04 LAB — CBC WITH DIFFERENTIAL/PLATELET
Abs Immature Granulocytes: 0 10*3/uL (ref 0.00–0.07)
Basophils Absolute: 0 10*3/uL (ref 0.0–0.1)
Basophils Relative: 1 %
Eosinophils Absolute: 0.1 10*3/uL (ref 0.0–0.5)
Eosinophils Relative: 5 %
HCT: 34.8 % — ABNORMAL LOW (ref 36.0–46.0)
Hemoglobin: 10.7 g/dL — ABNORMAL LOW (ref 12.0–15.0)
Immature Granulocytes: 0 %
Lymphocytes Relative: 29 %
Lymphs Abs: 0.7 10*3/uL (ref 0.7–4.0)
MCH: 29.7 pg (ref 26.0–34.0)
MCHC: 30.7 g/dL (ref 30.0–36.0)
MCV: 96.7 fL (ref 80.0–100.0)
Monocytes Absolute: 0.2 10*3/uL (ref 0.1–1.0)
Monocytes Relative: 9 %
Neutro Abs: 1.4 10*3/uL — ABNORMAL LOW (ref 1.7–7.7)
Neutrophils Relative %: 56 %
Platelets: 257 10*3/uL (ref 150–400)
RBC: 3.6 MIL/uL — ABNORMAL LOW (ref 3.87–5.11)
RDW: 21.2 % — ABNORMAL HIGH (ref 11.5–15.5)
WBC: 2.5 10*3/uL — ABNORMAL LOW (ref 4.0–10.5)
nRBC: 0 % (ref 0.0–0.2)

## 2018-07-04 LAB — COMPREHENSIVE METABOLIC PANEL
ALT: 33 U/L (ref 0–44)
ANION GAP: 20 — AB (ref 5–15)
AST: 46 U/L — ABNORMAL HIGH (ref 15–41)
Albumin: 4.1 g/dL (ref 3.5–5.0)
Alkaline Phosphatase: 133 U/L — ABNORMAL HIGH (ref 38–126)
BUN: 104 mg/dL — ABNORMAL HIGH (ref 6–20)
CO2: 16 mmol/L — ABNORMAL LOW (ref 22–32)
Calcium: 5.7 mg/dL — CL (ref 8.9–10.3)
Chloride: 101 mmol/L (ref 98–111)
Creatinine, Ser: 12.65 mg/dL — ABNORMAL HIGH (ref 0.44–1.00)
GFR calc Af Amer: 4 mL/min — ABNORMAL LOW (ref >60)
GFR calc non Af Amer: 4 mL/min — ABNORMAL LOW (ref >60)
Glucose, Bld: 78 mg/dL (ref 70–99)
Potassium: 6.2 mmol/L — ABNORMAL HIGH (ref 3.5–5.1)
Sodium: 137 mmol/L (ref 135–145)
Total Bilirubin: 0.6 mg/dL (ref 0.3–1.2)
Total Protein: 7.5 g/dL (ref 6.5–8.1)

## 2018-07-04 LAB — LIPASE, BLOOD: Lipase: 45 U/L (ref 11–51)

## 2018-07-04 LAB — PROTIME-INR
INR: 1.35
Prothrombin Time: 16.6 seconds — ABNORMAL HIGH (ref 11.4–15.2)

## 2018-07-04 LAB — I-STAT CG4 LACTIC ACID, ED: Lactic Acid, Venous: 0.71 mmol/L (ref 0.5–1.9)

## 2018-07-04 LAB — TROPONIN I: Troponin I: <0.03 ng/mL (ref <0.03)

## 2018-07-04 LAB — CBG MONITORING, ED: Glucose-Capillary: 71 mg/dL (ref 70–99)

## 2018-07-04 MED ORDER — SODIUM CHLORIDE 0.9 % IV BOLUS
250.0000 mL | Freq: Once | INTRAVENOUS | Status: AC
  Administered 2018-07-04: 250 mL via INTRAVENOUS

## 2018-07-04 MED ORDER — NEPRO/CARBSTEADY PO LIQD
237.0000 mL | Freq: Three times a day (TID) | ORAL | Status: DC | PRN
  Filled 2018-07-04: qty 237

## 2018-07-04 MED ORDER — SEVELAMER CARBONATE 800 MG PO TABS
800.0000 mg | ORAL_TABLET | Freq: Three times a day (TID) | ORAL | Status: DC
  Administered 2018-07-04 – 2018-07-05 (×4): 800 mg via ORAL
  Filled 2018-07-04 (×5): qty 1

## 2018-07-04 MED ORDER — ZOLPIDEM TARTRATE 5 MG PO TABS: 5.0000 mg | ORAL_TABLET | Freq: Every evening | ORAL | Status: DC | PRN

## 2018-07-04 MED ORDER — WARFARIN - PHARMACIST DOSING INPATIENT: Freq: Every day | Status: DC

## 2018-07-04 MED ORDER — HEPARIN SODIUM (PORCINE) 1000 UNIT/ML IJ SOLN
4000.0000 [IU] | INTRAMUSCULAR | Status: DC
  Administered 2018-07-04: 4000 [IU]
  Filled 2018-07-04: qty 4

## 2018-07-04 MED ORDER — FAMOTIDINE 20 MG PO TABS
20.0000 mg | ORAL_TABLET | Freq: Every day | ORAL | Status: DC
  Administered 2018-07-04: 20 mg via ORAL
  Filled 2018-07-04: qty 1

## 2018-07-04 MED ORDER — HYDROMORPHONE HCL 1 MG/ML IJ SOLN
1.0000 mg | Freq: Once | INTRAMUSCULAR | Status: AC
  Administered 2018-07-04: 1 mg via INTRAVENOUS
  Filled 2018-07-04: qty 1

## 2018-07-04 MED ORDER — FOLIC ACID 1 MG PO TABS
1.0000 mg | ORAL_TABLET | Freq: Every day | ORAL | Status: DC
  Administered 2018-07-05: 1 mg via ORAL
  Filled 2018-07-04: qty 1

## 2018-07-04 MED ORDER — METOCLOPRAMIDE HCL 5 MG/ML IJ SOLN
10.0000 mg | Freq: Once | INTRAMUSCULAR | Status: AC
  Administered 2018-07-04: 10 mg via INTRAVENOUS
  Filled 2018-07-04: qty 2

## 2018-07-04 MED ORDER — ONDANSETRON HCL 4 MG/2ML IJ SOLN: 4.0000 mg | Freq: Four times a day (QID) | INTRAMUSCULAR | Status: DC | PRN

## 2018-07-04 MED ORDER — RENA-VITE PO TABS
1.0000 | ORAL_TABLET | Freq: Every day | ORAL | Status: DC
  Administered 2018-07-04: 1 via ORAL
  Filled 2018-07-04 (×2): qty 1

## 2018-07-04 MED ORDER — ONDANSETRON HCL 4 MG PO TABS: 4.0000 mg | ORAL_TABLET | Freq: Four times a day (QID) | ORAL | Status: DC | PRN

## 2018-07-04 MED ORDER — LOSARTAN POTASSIUM 50 MG PO TABS
100.0000 mg | ORAL_TABLET | Freq: Every day | ORAL | Status: DC
  Administered 2018-07-05: 100 mg via ORAL
  Filled 2018-07-04: qty 2

## 2018-07-04 MED ORDER — WARFARIN SODIUM 10 MG PO TABS
10.0000 mg | ORAL_TABLET | Freq: Once | ORAL | Status: AC
  Administered 2018-07-04: 10 mg via ORAL
  Filled 2018-07-04 (×2): qty 1

## 2018-07-04 MED ORDER — ACETAMINOPHEN 650 MG RE SUPP: 650.0000 mg | Freq: Four times a day (QID) | RECTAL | Status: DC | PRN

## 2018-07-04 MED ORDER — SODIUM CHLORIDE 0.9 % IV SOLN
1.0000 g | Freq: Once | INTRAVENOUS | Status: DC
  Filled 2018-07-04: qty 10

## 2018-07-04 MED ORDER — CAMPHOR-MENTHOL 0.5-0.5 % EX LOTN
1.0000 "application " | TOPICAL_LOTION | Freq: Three times a day (TID) | CUTANEOUS | Status: DC | PRN
  Filled 2018-07-04: qty 222

## 2018-07-04 MED ORDER — ROSUVASTATIN CALCIUM 5 MG PO TABS
10.0000 mg | ORAL_TABLET | Freq: Every day | ORAL | Status: DC
  Administered 2018-07-04: 10 mg via ORAL
  Filled 2018-07-04: qty 2

## 2018-07-04 MED ORDER — CALCIUM GLUCONATE-NACL 1-0.675 GM/50ML-% IV SOLN
INTRAVENOUS | Status: AC
  Administered 2018-07-04: 1000 mg
  Filled 2018-07-04: qty 50

## 2018-07-04 MED ORDER — CALCIUM CARBONATE ANTACID 1250 MG/5ML PO SUSP
500.0000 mg | Freq: Four times a day (QID) | ORAL | Status: DC | PRN
  Filled 2018-07-04: qty 5

## 2018-07-04 MED ORDER — KETOROLAC TROMETHAMINE 15 MG/ML IJ SOLN
15.0000 mg | Freq: Four times a day (QID) | INTRAMUSCULAR | Status: DC | PRN
  Administered 2018-07-04 – 2018-07-05 (×3): 15 mg via INTRAVENOUS
  Filled 2018-07-04 (×3): qty 1

## 2018-07-04 MED ORDER — HEPARIN SODIUM (PORCINE) 1000 UNIT/ML DIALYSIS
3000.0000 [IU] | Freq: Once | INTRAMUSCULAR | Status: DC
  Filled 2018-07-04: qty 3

## 2018-07-04 MED ORDER — HYDROXYZINE HCL 25 MG PO TABS: 25.0000 mg | ORAL_TABLET | Freq: Three times a day (TID) | ORAL | Status: DC | PRN

## 2018-07-04 MED ORDER — CALCIUM GLUCONATE-NACL 1-0.675 GM/50ML-% IV SOLN
1.0000 g | Freq: Once | INTRAVENOUS | Status: DC
  Filled 2018-07-04 (×2): qty 50

## 2018-07-04 MED ORDER — SORBITOL 70 % SOLN
30.0000 mL | Status: DC | PRN
  Filled 2018-07-04: qty 30

## 2018-07-04 MED ORDER — DOCUSATE SODIUM 100 MG PO CAPS
100.0000 mg | ORAL_CAPSULE | Freq: Two times a day (BID) | ORAL | Status: DC
  Administered 2018-07-04 – 2018-07-05 (×2): 100 mg via ORAL
  Filled 2018-07-04 (×2): qty 1

## 2018-07-04 MED ORDER — ACETAMINOPHEN 325 MG PO TABS
650.0000 mg | ORAL_TABLET | Freq: Four times a day (QID) | ORAL | Status: DC | PRN
  Administered 2018-07-04 – 2018-07-05 (×2): 650 mg via ORAL
  Filled 2018-07-04 (×2): qty 2

## 2018-07-04 MED ORDER — ALUM & MAG HYDROXIDE-SIMETH 200-200-20 MG/5ML PO SUSP
30.0000 mL | Freq: Once | ORAL | Status: AC
  Administered 2018-07-04: 30 mL via ORAL
  Filled 2018-07-04: qty 30

## 2018-07-04 MED ORDER — HEPARIN SODIUM (PORCINE) 1000 UNIT/ML IJ SOLN
INTRAMUSCULAR | Status: AC
  Administered 2018-07-04: 4000 [IU]
  Filled 2018-07-04: qty 4

## 2018-07-04 MED ORDER — CHLORHEXIDINE GLUCONATE CLOTH 2 % EX PADS
6.0000 | MEDICATED_PAD | Freq: Every day | CUTANEOUS | Status: DC
  Administered 2018-07-05: 6 via TOPICAL

## 2018-07-04 MED ORDER — LIDOCAINE VISCOUS HCL 2 % MT SOLN
15.0000 mL | Freq: Once | OROMUCOSAL | Status: AC
  Administered 2018-07-04: 15 mL via ORAL
  Filled 2018-07-04: qty 15

## 2018-07-04 MED ORDER — DOCUSATE SODIUM 283 MG RE ENEM
1.0000 | ENEMA | RECTAL | Status: DC | PRN
  Filled 2018-07-04: qty 1

## 2018-07-04 NOTE — H&P (Signed)
History and Physical    Crystal Dunlap VPX:[id number] DOB: [birth year] DOA: 07/04/2018  PCP: Henderson Baltimore, FNP Consultants:  Augustin Coupe - nephrology  Patient coming from:  Home - lives with mother; [facility]: Mother, [phone]  Chief Complaint: weakness  HPI: Crystal Dunlap is a 28 y.o. 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  This AM, she was too weak to stand or walk and was hurting in the middle of her chest.  She has HD MWF and was dialyzed Friday without issue.  No similar issues.  Yesterday, she felt weak and tired all day.  She is uncertain why this happened.  She was given an antibiotic last week for PNA, but those symptoms have resolved.  She still feels somewhat weak but does seem to feel better now.  She is on the transplant list, having failed transplantation from 2007.   ED Course:  [facility] transfer for HD.  Hypocalcemia, needs HD for electrolyte abnormality.  Calcium is 5.7.    Review of Systems: As per HPI; otherwise review of systems reviewed and negative.   Ambulatory Status:  Ambulates without assistance  Past Medical History:  Diagnosis Date  . Anemia   . ESRD (end stage renal disease) on dialysis ([place]) 08/10/2008   MWF; [facility]; [place]. Started dialysis age 48 (FSGS), has had 2 failed transplants first in 1994 lasted 6 mos, the 2nd in 2007 lasted 3 yrs removed 2010. on HD since 2010. TDC dependent for several yrs.    . Focal segmental glomerulosclerosis   . GERD (gastroesophageal reflux disease)   . Hyperlipidemia   . Hypertension   . 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 Secondary hyperparathyroidism ([place])   . SIRS (systemic inflammatory response syndrome) ([place]) 11/23/2016    Past Surgical History:  Procedure Laterality Date  . COLON SURGERY  ~ 2011/2012   "for bowel obstruction"  . INSERTION OF DIALYSIS CATHETER Left ~ 2016   chest  . KIDNEY  TRANSPLANT  2007    Social History   Socioeconomic History  . Marital status: Single    Spouse name: Not on file  . Number of children: Not on file  . Years of education: Not on file  . Highest education level: Not on file  Occupational History  . Occupation: Pharmacist, hospital  Social Needs  . Financial resource strain: Not on file  . Food insecurity:    Worry: Not on file    Inability: Not on file  . Transportation needs:    Medical: Not on file    Non-medical: Not on file  Tobacco Use  . Smoking status: Never Smoker  . Smokeless tobacco: Never Used  Substance and Sexual Activity  . Alcohol use: No  . Drug use: No  . Sexual activity: Not Currently  Lifestyle  . Physical activity:    Days per week: Not on file    Minutes per session: Not on file  . Stress: Not on file  Relationships  . Social connections:    Talks on phone: Not on file    Gets together: Not on file    Attends religious service: Not on file    Active member of club or organization: Not on file    Attends meetings of clubs or organizations: Not on file    Relationship status: Not on file  . Intimate partner violence:    Fear  of current or ex partner: Not on file    Emotionally abused: Not on file    Physically abused: Not on file    Forced sexual activity: Not on file  Other Topics Concern  . Not on file  Social History Narrative  . Not on file    Allergies  Allergen Reactions  . Cefazolin Anaphylaxis  . Ferrlecit [Na Ferric Gluc Cplx In Sucrose] Anaphylaxis    Anaphylactic shock/severe angioedema on 08/09/15 related to IV iron Ferrlecit IV)  required intubation for respiratory failure  . Cyclosporine Other (See Comments)    Loss of motor skills  . Lactose Intolerance (Gi) Nausea Only    Stomach pains also  . Latex Rash  . Vancomycin Rash    History reviewed. No pertinent family history.  Prior to Admission medications   Medication Sig Start Date End Date Taking? Authorizing Provider    Cholecalciferol (VITAMIN D3) 2000 units capsule Take 2,000 Units by mouth daily. 05/08/16  Yes [provider]  diphenhydrAMINE (BENADRYL) 25 mg capsule Take 1 capsule (25 mg total) by mouth every 6 (six) hours as needed for allergies. 08/14/15  Yes Ghimire, Henreitta Leber, MD  docusate sodium (COLACE) 100 MG capsule Take 200 mg by mouth daily.   Yes [provider]  famotidine (PEPCID) 20 MG tablet Take 20 mg by mouth 2 (two) times daily.   Yes [provider]  folic acid (FOLVITE) 1 MG tablet Take 1 mg by mouth daily.   Yes [provider]  losartan (COZAAR) 100 MG tablet Take 100 mg by mouth daily.   Yes [provider]  multivitamin (RENA-VIT) TABS tablet Take 1 tablet by mouth daily. 02/12/15  Yes [provider]  rosuvastatin (CRESTOR) 10 MG tablet Take 10 mg by mouth daily.   Yes [provider]  sevelamer (RENAGEL) 800 MG tablet Take 800 mg by mouth 3 (three) times daily with meals.   Yes [provider]  warfarin (COUMADIN) 1 MG tablet Take 3.5 mg by mouth at bedtime. IN CONJUNCTION WITH ONE 5 MG TABLET TO EQUAL A TOTAL DOSAGE OF 8.5 MILLIGRAMS   Yes [provider]  warfarin (COUMADIN) 5 MG tablet Take 5 mg by mouth at bedtime. IN CONJUNCTION WITH THREE AND ONE-HALF 1 MG TABLETS TO EQUAL A TOTAL DOSAGE OF 8.5 MILLIGRAMS   Yes [provider]  diclofenac sodium (VOLTAREN) 1 % GEL Apply 4 g topically 4 (four) times daily. Patient not taking: Reported on 07/04/2018 12/28/16   Palumbo, April, MD  EPINEPHrine 0.3 mg/0.3 mL IJ SOAJ injection Inject 0.3 mLs (0.3 mg total) into the muscle once. Patient taking differently: Inject 0.3 mg into the muscle once as needed (FOR ANAPHYLAXIS).  08/16/15   Vira Blanco, MD  traMADol (ULTRAM) 50 MG tablet Take 1 tablet (50 mg total) by mouth every 12 (twelve) hours as needed. Patient not taking: Reported on 07/04/2018 12/28/16   Veatrice Kells, MD    Physical Exam: Vitals:    07/04/18 1530 07/04/18 1600 07/04/18 1630 07/04/18 1700  BP: (!) 99/59 96/63 107/70 111/73  Pulse: 61 61 62 61  Resp: 11 12 12 16   Temp:      TempSrc:      SpO2:      Weight:      Height:         General:  Appears calm and comfortable and is NAD; sleeping during HD Eyes:  EOMI, normal lids, iris ENT:  grossly normal hearing, lips & tongue,  mmm; appropriate dentition Neck:  no LAD, masses or thyromegaly; HD catheter in left neck Cardiovascular:  RRR, no m/r/g. No LE edema.  Respiratory:   CTA bilaterally with no wheezes/rales/rhonchi.  Normal respiratory effort. Abdomen:  soft, NT, ND, NABS Back:   normal alignment, no CVAT Skin:  no rash or induration seen on limited exam Musculoskeletal:  grossly normal tone BUE/BLE, good ROM, no bony abnormality Psychiatric:  grossly normal mood and affect, speech fluent and appropriate, AOx3 Neurologic:  CN 2-12 grossly intact, moves all extremities in coordinated fashion, sensation intact    Radiological Exams on Admission: Dg Chest Portable 1 View  Result Date: 07/04/2018 CLINICAL DATA:  Chest pain and weakness over the past 3 days. EXAM: PORTABLE CHEST 1 VIEW COMPARISON:  Two-view chest x-ray 06/23/2018 CT chest 12/24/2017. FINDINGS: The heart size is normal. There is no edema. A small right effusion is suggested. Minimal right basilar atelectasis is noted. The lungs are otherwise clear. Previously noted right middle lobe airspace disease has cleared. A left IJ dialysis catheter is in place.  SVC stent is again noted. IMPRESSION: 1. Small right pleural effusion and minimal right basilar atelectasis or scarring. 2. Interval clearing of previous right middle lobe airspace disease. 3. Dialysis catheter is stable. Electronically Signed   By: San Morelle M.D.   On: 07/04/2018 07:29    EKG: Independently reviewed.  NSR with rate 57; prolonged QT (503) with no evidence of acute ischemia   Labs on Admission: I have personally reviewed the  available labs and imaging studies at the time of the admission.  Pertinent labs:   K+ 6.2 CO2 16 BUN 104/Creatinine 12.65/GFR 4 Anion gap 20 Troponin <0.03 Lactate 0.71 WBC 2.5 Hgb 10.7 INR 1.35  Assessment/Plan Principal Problem:   Hypocalcemia Active Problems:   End stage renal disease on dialysis (H[facility])   History of DVT (deep vein thrombosis)   Anemia in ESRD (end-stage renal disease) (HCC)   Essential hypertension   Hypocalcemia -Patient presented with c/o weakness -Suspect that most symptoms were attributed to need for HD -However, she also did have pronounced hypocalcemia -This was repleted in the ER and with HD -Anticipate resolution of this issue, although she may continue to require Ca++ in subsequent HD -Ionized Ca++ was added on to prior labs and is pending -Will observe overnight, anticipate d/c to home in AM  ESRD on HD -Patient on chronic MWF HD -Nephrology prn order set utilized -She was in need of acute HD and was dialyzed immediately after arrival -It is possible that she will need serial HD -Nephrology is following  Anemia -Appears to be stable at this time  H/o DVT, on Coumadin -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 The aforementioned taken together are felt to place the patient at high risk for further clinical deterioration. However it is anticipated that the patient may be medically stable for discharge from the hospital within 24 to 48 hours.    Karmen Bongo MD [facility]  If note is complete, please contact covering daytime or  nighttime physician. [email or web address] Password [email or web address]  07/04/2018, 5:18 PM

## 2018-07-04 NOTE — Procedures (Signed)
   I was present at this dialysis session, have reviewed the session itself and made  appropriate changes Kelly Splinter MD [place] pager [phone]   07/04/2018, 5:10 PM

## 2018-07-04 NOTE — ED Notes (Signed)
Transfer from [place] to be admitted for dialysis and for hypocalcemia- received calcium gluconate at [facility].  Has a saline lock in right AC>

## 2018-07-04 NOTE — ED Provider Notes (Addendum)
[facility] EMERGENCY DEPARTMENT Provider Note   CSN: [id number] Arrival date & time: 07/04/18  0636     History   Chief Complaint Chief Complaint  Patient presents with  . Weakness  . Chest Pain    HPI Crystal Dunlap is a 28 y.o. female.  Patient is a 28 year old female with a history of end-stage renal disease on dialysis Monday Wednesday Friday, prior PE on Coumadin, hypertension who is presenting today with generalized weakness for the last 2 days.  Patient states approximately 2 weeks ago she was diagnosed with pneumonia and started on an antibiotic.  Since starting the antibiotic she has had extreme nausea and vomiting.  She stopped the antibiotic on Friday but continues to have nausea.  Her last episode of vomiting was Friday.  Yesterday she had generalized weakness and was more fatigued but today when she woke up she could not even walk or put weight on her legs due to her fatigue and weakness.  She also started having some discomfort in her chest today which she points is in the substernal area.  She has had an ongoing cough which is been intermittently productive but no fever.  She denies any shortness of breath, leg swelling, abdominal pain.  Her last full session of dialysis was on Friday.  The antibiotic had been the only new medication she had tried.  The history is provided by the patient.  Weakness  Primary symptoms comment: generalized weakness. This is a recurrent problem. The current episode started 2 days ago. The problem has been gradually worsening. There was bilateral focality noted. There has been no fever. Associated symptoms include chest pain and vomiting. Pertinent negatives include no shortness of breath and no altered mental status. Associated medical issues comments: ESRD MWF, PE, HTN, GERD.  Chest Pain   Associated symptoms include vomiting and weakness. Pertinent negatives include no shortness of breath.    Past Medical History:  Diagnosis Date    . Anemia   . ESRD (end stage renal disease) ([place]) Feb. 12, 2010   First date of Dialysis   . ESRD (end stage renal disease) on dialysis ([place])    "MWF; [facility]; [place]" (11/23/2016)  . Focal segmental glomerulosclerosis   . GERD (gastroesophageal reflux disease)   . Hyperlipidemia   . Hypertension   . Pulmonary emboli ([place]) ~ 2013  . Secondary hyperparathyroidism ([place])   . SIRS (systemic inflammatory response syndrome) ([place]) 11/23/2016    Patient Active Problem List   Diagnosis Date Noted  . Acute upper respiratory infection   . Multifocal pneumonia 11/25/2016  . FUO (fever of unknown origin) 11/23/2016  . Sepsis due to undetermined organism ([place]) 11/23/2016  . History of DVT (deep vein thrombosis) 11/23/2016  . Anemia in ESRD (end-stage renal disease) ([place]) 11/23/2016  . Thrombocytopenia ([place]) 11/23/2016  . Essential hypertension 11/23/2016  . Respiratory failure ([place])   . Allergic reaction 08/09/2015  . Anaphylaxis 08/09/2015  . 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 End stage renal disease on dialysis ([place]) 08/09/2015  . Acute respiratory failure with hypoxia ([place]) 08/09/2015    Past Surgical History:  Procedure Laterality Date  . COLON SURGERY  ~ 2011/2012   "for bowel obstruction"  . INSERTION OF DIALYSIS CATHETER Left ~ 2016   chest  . KIDNEY TRANSPLANT  2007     OB History   No obstetric history on file.      Home Medications    Prior to Admission medications   Medication Sig Start  Date End Date Taking? Authorizing Provider  calcium acetate (PHOSLO) 667 MG capsule Take 1,334 mg by mouth 3 (three) times daily with meals.    Yes [provider]  amLODipine (NORVASC) 10 MG tablet Take 10 mg by mouth daily. 07/06/16   [provider]  Cholecalciferol (VITAMIN D3) 2000 units capsule Take 2,000 Units by mouth daily. 05/08/16   [provider]  diclofenac sodium (VOLTAREN) 1 % GEL Apply 4 g topically 4 (four) times daily. 12/28/16    Palumbo, April, MD  diphenhydrAMINE (BENADRYL) 25 mg capsule Take 1 capsule (25 mg total) by mouth every 6 (six) hours as needed for allergies. 08/14/15   Ghimire, Henreitta Leber, MD  EPINEPHrine 0.3 mg/0.3 mL IJ SOAJ injection Inject 0.3 mLs (0.3 mg total) into the muscle once. Patient taking differently: Inject 0.3 mg into the muscle once as needed (FOR ANAPHYLAXIS).  08/16/15   Vira Blanco, MD  folic acid (FOLVITE) 1 MG tablet Take 1 mg by mouth daily.    [provider]  hydrALAZINE (APRESOLINE) 25 MG tablet Take 25 mg by mouth 3 (three) times daily. 11/22/16   [provider]  losartan (COZAAR) 100 MG tablet Take 100 mg by mouth daily.    [provider]  multivitamin (RENA-VIT) TABS tablet Take 1 tablet by mouth daily. 02/12/15   [provider]  ranitidine (ZANTAC) 300 MG capsule Take 300 mg by mouth every evening.    [provider]  rosuvastatin (CRESTOR) 10 MG tablet Take 10 mg by mouth daily.    [provider]  sevelamer (RENAGEL) 800 MG tablet Take 800 mg by mouth 3 (three) times daily with meals.    [provider]  traMADol (ULTRAM) 50 MG tablet Take 1 tablet (50 mg total) by mouth every 12 (twelve) hours as needed. 12/28/16   Palumbo, April, MD  warfarin (COUMADIN) 1 MG tablet Take 3.5 mg by mouth at bedtime. IN CONJUNCTION WITH ONE 5 MG TABLET TO EQUAL A TOTAL DOSAGE OF 8.5 MILLIGRAMS    [provider]  warfarin (COUMADIN) 5 MG tablet Take 5 mg by mouth at bedtime. IN CONJUNCTION WITH THREE AND ONE-HALF 1 MG TABLETS TO EQUAL A TOTAL DOSAGE OF 8.5 MILLIGRAMS    [provider]    Family History No family history on file.  Social History Social History   Tobacco Use  . Smoking status: Never Smoker  . Smokeless tobacco: Never Used  Substance Use Topics  . Alcohol use: No  . Drug use: No     Allergies   Cefazolin; Ferrlecit [na ferric gluc cplx in sucrose]; Cyclosporine; Lactose intolerance (gi);  Latex; and Vancomycin   Review of Systems Review of Systems  Respiratory: Negative for shortness of breath.   Cardiovascular: Positive for chest pain.  Gastrointestinal: Positive for vomiting.  Neurological: Positive for weakness.  All other systems reviewed and are negative.    Physical Exam Updated Vital Signs Pulse (!) 119   Temp 98.2 F (36.8 C)   Resp 16   Ht 5\' 2"  (1.575 m)   Wt 46 kg   SpO2 99%   BMI 18.55 kg/m   Physical Exam Vitals signs and nursing note reviewed.  Constitutional:      General: She is not in acute distress.    Appearance: She is well-developed.     Comments: Chronically ill-appearing  HENT:     Head: Normocephalic and atraumatic.  Eyes:     Pupils: Pupils are equal, round, and  reactive to light.  Cardiovascular:     Rate and Rhythm: Regular rhythm. Tachycardia present.     Heart sounds: Normal heart sounds. No murmur. No friction rub.  Pulmonary:     Effort: Pulmonary effort is normal.     Breath sounds: Normal breath sounds. No wheezing or rales.  Chest:     Chest wall: No tenderness.  Abdominal:     General: Bowel sounds are normal. There is no distension.     Palpations: Abdomen is soft.     Tenderness: There is no abdominal tenderness. There is no guarding or rebound.  Musculoskeletal: Normal range of motion.        General: No tenderness.     Comments: No edema  Skin:    General: Skin is warm and dry.     Findings: No rash.  Neurological:     Mental Status: She is alert and oriented to person, place, and time.     Cranial Nerves: No cranial nerve deficit.     Comments: 4 out of 5 strength in upper and lower extremities.  Sensation is intact.  Patient not ambulated due to stating she is just too weak to walk  Psychiatric:        Behavior: Behavior normal.      ED Treatments / Results  Labs (all labs ordered are listed, but only abnormal results are displayed) Labs Reviewed  CBC WITH DIFFERENTIAL/PLATELET - Abnormal;  Notable for the following components:      Result Value   WBC 2.5 (*)    RBC 3.60 (*)    Hemoglobin 10.7 (*)    HCT 34.8 (*)    RDW 21.2 (*)    Neutro Abs 1.4 (*)    All other components within normal limits  PROTIME-INR - Abnormal; Notable for the following components:   Prothrombin Time 16.6 (*)    All other components within normal limits  COMPREHENSIVE METABOLIC PANEL - Abnormal; Notable for the following components:   Potassium 6.2 (*)    CO2 16 (*)    BUN 104 (*)    Creatinine, Ser 12.65 (*)    Calcium 5.7 (*)    AST 46 (*)    Alkaline Phosphatase 133 (*)    GFR calc non Af Amer 4 (*)    GFR calc Af Amer 4 (*)    Anion gap 20 (*)    All other components within normal limits  CULTURE, BLOOD (ROUTINE X 2)  CULTURE, BLOOD (ROUTINE X 2)  TROPONIN I  LIPASE, BLOOD  I-STAT CG4 LACTIC ACID, ED  CBG MONITORING, ED  I-STAT CG4 LACTIC ACID, ED    EKG EKG Interpretation  Date/Time:  Monday July 04 2018 06:49:40 EST Ventricular Rate:  57 PR Interval:    QRS Duration: 109 QT Interval:  516 QTC Calculation: 503 R Axis:   75 Text Interpretation:  Sinus rhythm Prolonged QT interval QT more prolonged Confirmed by Ezequiel Essex [phone]) on 07/04/2018 6:55:42 AM   Radiology Dg Chest Portable 1 View  Result Date: 07/04/2018 CLINICAL DATA:  Chest pain and weakness over the past 3 days. EXAM: PORTABLE CHEST 1 VIEW COMPARISON:  Two-view chest x-ray 06/23/2018 CT chest 12/24/2017. FINDINGS: The heart size is normal. There is no edema. A small right effusion is suggested. Minimal right basilar atelectasis is noted. The lungs are otherwise clear. Previously noted right middle lobe airspace disease has cleared. A left IJ dialysis catheter is in place.  SVC stent is again noted. IMPRESSION:  1. Small right pleural effusion and minimal right basilar atelectasis or scarring. 2. Interval clearing of previous right middle lobe airspace disease. 3. Dialysis catheter is stable. Electronically  Signed   By: San Morelle M.D.   On: 07/04/2018 07:29    Procedures Procedures (including critical care time)  Medications Ordered in ED Medications  metoCLOPramide (REGLAN) injection 10 mg (has no administration in time range)     Initial Impression / Assessment and Plan / ED Course  I have reviewed the triage vital signs and the nursing notes.  Pertinent labs & imaging results that were available during my care of the patient were reviewed by me and considered in my medical decision making (see chart for details).    28 year old female with chronic medical problems presenting today with generalized weakness.  Patient last dialyzed on Friday a full course.  However she is felt gradual weakness since yesterday.  She started having some central chest discomfort today and ongoing nausea.  The nausea has been present since she started taking doxycycline 2 weeks ago for a community-acquired pneumonia.  She stopped the medication on Friday.  She says she has been eating.  She has no shortness of breath, fever or abdominal pain.  Concern for worsening pneumonia versus dehydration versus electrolyte abnormality.  Patient has no abdominal pain spacious for appendicitis, diverticulitis or perforation.  Patient's chest x-ray today shows interval clearing of previous right middle lobe airspace disease.  Patient is afebrile here.  She is tachycardic but oxygen saturation is 99% on room air and she is in no respiratory distress. Lower suspicion for PE as patient is on Coumadin and will check an INR.  Low suspicion for dissection based on patient's story.  Possibility for gastritis given recent antibiotics. Labs are pending  9:50 AM Patient's lactate is improved today as well as her CBC is at its baseline.  No evidence of anemia.  X-ray with clear pneumonia and only trace right pleural effusion which is chronic.  Patient's CMP shows a potassium of 6.2 as well as a low calcium today of 5.7 and signs  that she needs dialysis with an anion gap of 20.  Patient was given calcium but she has no EKG changes.  She was given a small amount of fluid as she may be dehydrated from all the nausea and a GI cocktail.  Patient also had Reglan and states her chest pain and nausea is gone but she has a profound sense of dizziness like she is going to pass out.  Her blood sugar is normal her blood pressure and heart rate have not changed.  EKG is unchanged.  Will discuss with nephrology as patient needs urgent dialysis as well as admission for further work-up of the profound weakness.  May be related to her new low calcium.  10:32 AM Will need hospitalist admission however after speaking with Dr. Burnett Sheng feel that patient needs to be transferred to the emergency room so that she can receive dialysis as there are no beds currently.  Discussed with Dr. Venora Maples and hospitalist will be consulted when patient gets to the [facility] emergency room.  CRITICAL CARE Performed by: Tanasha Menees Total critical care time: 30 minutes Critical care time was exclusive of separately billable procedures and treating other patients. Critical care was necessary to treat or prevent imminent or life-threatening deterioration. Critical care was time spent personally by me on the following activities: development of treatment plan with patient and/or surrogate as well as nursing, discussions with  consultants, evaluation of patient's response to treatment, examination of patient, obtaining history from patient or surrogate, ordering and performing treatments and interventions, ordering and review of laboratory studies, ordering and review of radiographic studies, pulse oximetry and re-evaluation of patient's condition.  Final Clinical Impressions(s) / ED Diagnoses   Final diagnoses:  Generalized weakness  Hyperkalemia  Hypocalcemia    ED Discharge Orders    None       Blanchie Dessert, MD 07/04/18 1252    Blanchie Dessert,  MD 07/04/18 1032

## 2018-07-04 NOTE — ED Provider Notes (Signed)
Pt transferred from [facility] for admission and dialysis.  Please see previous notes for further history.  In brief, patient presenting for evaluation of generalized weakness.  Additionally, she had some chest discomfort stated with nausea and vomiting.  Work-up at [facility] showed hypocalcemia at 5 and hyperkalemia at 6.2 with an anion gap of 20.  Patient given calcium.  Dr. Jonnie Finner with nephrology was consulted, recommended transfer to [facility] for admission and dialysis.   Upon arrival, pt reports no new complaints. CP improved.  Physical Exam: Gen: pt appears dehydrated. In NAD CV: RRR Pulm: CTAB  Will call for admission.  Discussed with Dr. Lorin Mercy from [facility]. Pt to be admitted.     Franchot Heidelberg, PA-C 07/04/18 1623    Jola Schmidt, MD 07/04/18 [phone]

## 2018-07-04 NOTE — ED Triage Notes (Signed)
Pt is on dialysis MWF. Fatigue that started yesterday, weakness, inability to hold her own weight and CP that started today. Recent tx for CAP (2 wks ago).

## 2018-07-04 NOTE — ED Notes (Signed)
IV attempt x2 without success.

## 2018-07-04 NOTE — Consult Note (Signed)
Renal Service Consult Note [facility]  Crystal Dunlap 07/04/2018 Sol [place] Requesting Physician:  Dr Lorin Mercy  Reason for Consult:  Gen'd weakness, hyperkalemia HPI: The patient is a 28 y.o. year-old w/ hx of ESRD see below, HTN, failed Tx x 2, HTN and hx PE admitted for gen'd weakness and hyperkalemia.  MWF HD, hasn't missed any HD.  K 6.2, + chest pain and low Ca++ 5.7.  Asked to see for dialysis.   Patient w/o any complaitns today.  No n/v/d, no abd pain, no sob or couhgh, no leg swelling.    MWF [employer] [facility]. 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 Back on HD since 2010. TDC dependent for several yrs.     ROS  no joint pain   no HA  no blurry vision  no rash  no diarrhea  no nausea/ vomiting   Past Medical History  Past Medical History:  Diagnosis Date  . Anemia   . ESRD (end stage renal disease) on dialysis ([place]) 08/10/2008   "MWF; [facility]; [place]"   . Focal segmental glomerulosclerosis   . GERD (gastroesophageal reflux disease)   . Hyperlipidemia   . Hypertension   . Pulmonary emboli ([place])    x2, on Coumadin lifelong  . Secondary hyperparathyroidism ([place])   . SIRS (systemic inflammatory response syndrome) ([place]) 11/23/2016   Past Surgical History  Past Surgical History:  Procedure Laterality Date  . COLON SURGERY  ~ 2011/2012   "for bowel obstruction"  . INSERTION OF DIALYSIS CATHETER Left ~ 2016   chest  . KIDNEY TRANSPLANT  2007   Family History History reviewed. No pertinent family history. Social History  reports that she has never smoked. She has never used smokeless tobacco. She reports that she does not drink alcohol or use drugs. Allergies  Allergies  Allergen Reactions  . Cefazolin Anaphylaxis  . Ferrlecit [Na Ferric Gluc Cplx In Sucrose] Anaphylaxis    Anaphylactic shock/severe angioedema on 08/09/15 related to IV iron  Ferrlecit IV)  required intubation for respiratory failure  . Cyclosporine Other (See Comments)    Loss of motor skills  . Lactose Intolerance (Gi) Nausea Only    Stomach pains also  . Latex Rash  . Vancomycin Rash   Home medications Prior to Admission medications   Medication Sig Start Date End Date Taking? Authorizing Provider  Cholecalciferol (VITAMIN D3) 2000 units capsule Take 2,000 Units by mouth daily. 05/08/16  Yes [provider]  diphenhydrAMINE (BENADRYL) 25 mg capsule Take 1 capsule (25 mg total) by mouth every 6 (six) hours as needed for allergies. 08/14/15  Yes Ghimire, Henreitta Leber, MD  docusate sodium (COLACE) 100 MG capsule Take 200 mg by mouth daily.   Yes [provider]  famotidine (PEPCID) 20 MG tablet Take 20 mg by mouth 2 (two) times daily.   Yes [provider]  folic acid (FOLVITE) 1 MG tablet Take 1 mg by mouth daily.   Yes [provider]  losartan (COZAAR) 100 MG tablet Take 100 mg by mouth daily.   Yes [provider]  multivitamin (RENA-VIT) TABS tablet Take 1 tablet by mouth daily. 02/12/15  Yes [provider]  rosuvastatin (CRESTOR) 10 MG tablet Take 10 mg by mouth daily.   Yes [provider]  sevelamer (RENAGEL) 800 MG tablet Take 800 mg by mouth 3 (three) times daily with meals.  Yes [provider]  warfarin (COUMADIN) 1 MG tablet Take 3.5 mg by mouth at bedtime. IN CONJUNCTION WITH ONE 5 MG TABLET TO EQUAL A TOTAL DOSAGE OF 8.5 MILLIGRAMS   Yes [provider]  warfarin (COUMADIN) 5 MG tablet Take 5 mg by mouth at bedtime. IN CONJUNCTION WITH THREE AND ONE-HALF 1 MG TABLETS TO EQUAL A TOTAL DOSAGE OF 8.5 MILLIGRAMS   Yes [provider]  EPINEPHrine 0.3 mg/0.3 mL IJ SOAJ injection Inject 0.3 mLs (0.3 mg total) into the muscle once. Patient taking differently: Inject 0.3 mg into the muscle once as needed (FOR ANAPHYLAXIS).  08/16/15   Vira Blanco, MD   Liver Function  Tests Recent Labs  Lab 07/04/18 0816  AST 46*  ALT 33  ALKPHOS 133*  BILITOT 0.6  PROT 7.5  ALBUMIN 4.1   Recent Labs  Lab 07/04/18 0816  LIPASE 45   CBC Recent Labs  Lab 07/04/18 0815  WBC 2.5*  NEUTROABS 1.4*  HGB 10.7*  HCT 34.8*  MCV 96.7  PLT 355   Basic Metabolic Panel Recent Labs  Lab 07/04/18 0816  NA 137  K 6.2*  CL 101  CO2 16*  GLUCOSE 78  BUN 104*  CREATININE 12.65*  CALCIUM 5.7*   Iron/TIBC/Ferritin/ %Sat No results found for: IRON, TIBC, FERRITIN, IRONPCTSAT  Vitals:   07/04/18 1515 07/04/18 1530 07/04/18 1600 07/04/18 1630  BP: 99/60 (!) 99/59 96/63 107/70  Pulse: 66 61 61 62  Resp: 13 11 12 12   Temp:      TempSrc:      SpO2:      Weight:      Height:       Exam Gen alert small-framed AAF no distress, lying flat No rash, cyanosis or gangrene Sclera anicteric, throat clear  No jvd or bruits Chest clear bilat to bases L chest TDC in place RRR no MRG Abd soft ntnd no mass or ascites +bs, mult scars abdomen well healed GU defer MS no joint effusions or deformity Ext no sig LE or UE edema, no wounds or ulcers Neuro is alert, Ox 3 , nf    Home meds:  - famotidine 20 bid/ rosuvastatin 10 qd/ sevelamer 800 tid ac  - warfarin 8.5mg  pm  - losartan 100 qd  - prns/ vitamins   Dialysis: MWF [facility]  3.5h   46kg   2/2.5 bath  TDC L  Hep 3000    A: 1. Gen'd weakness 2. Hyperkalemia  3. ESRD on HD 4. Vol -stable on exam 5. Hx PE on coumadin 6. HTN   P- HD today, lower K   Kelly Splinter MD [employer] pager [phone]   07/04/2018, 5:02 PM

## 2018-07-04 NOTE — Progress Notes (Signed)
ANTICOAGULATION CONSULT NOTE - Initial Consult  Pharmacy Consult for Warfarin Indication: Hx of pulmonary embolus  Allergies  Allergen Reactions  . Cefazolin Anaphylaxis  . Ferrlecit [Na Ferric Gluc Cplx In Sucrose] Anaphylaxis    Anaphylactic shock/severe angioedema on 08/09/15 related to IV iron Ferrlecit IV)  required intubation for respiratory failure  . Cyclosporine Other (See Comments)    Loss of motor skills  . Lactose Intolerance (Gi) Nausea Only    Stomach pains also  . Latex Rash  . Vancomycin Rash    Patient Measurements: Height: 5\' 2"  (157.5 cm) Weight: (pt states she cannot stand at this time) IBW/kg (Calculated) : 50.1  Vital Signs: Temp: 98.9 F (37.2 C) (01/06 1322) Temp Source: Oral (01/06 1322) BP: 99/59 (01/06 1530) Pulse Rate: 61 (01/06 1530)  Labs: Recent Labs    07/04/18 0815 07/04/18 0816  HGB 10.7*  --   HCT 34.8*  --   PLT 257  --   LABPROT 16.6*  --   INR 1.35  --   CREATININE  --  12.65*  TROPONINI <0.03  --     Estimated Creatinine Clearance: 4.8 mL/min (A) (by C-G formula based on SCr of 12.65 mg/dL (H)).   Medical History: Past Medical History:  Diagnosis Date  . Anemia   . ESRD (end stage renal disease) on dialysis ([place]) 08/10/2008   "MWF; [facility]; [place]"   . Focal segmental glomerulosclerosis   . GERD (gastroesophageal reflux disease)   . Hyperlipidemia   . Hypertension   . 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 Secondary hyperparathyroidism ([place])   . SIRS (systemic inflammatory response syndrome) ([place]) 11/23/2016    Assessment: 22 YOF who presented to the [facility] on 1/6 with generalized weakness for 2 days. The patient was on warfarin PTA for hx PE - admit INR 1.35 on PTA dose of 8.5 mg/day. Pharmacy consulted to resume dosing this admit.   Goal of Therapy:  INR 2-3 Monitor platelets by anticoagulation protocol: Yes   Plan:  - Warfarin 10 mg x 1 dose at 1800 today - Will continue to monitor for  any signs/symptoms of bleeding and will follow up with PT/INR in the a.m.  Thank you for allowing pharmacy to be a part of this patient's care.  Alycia Rossetti, PharmD, BCPS Clinical Pharmacist Please check AMION for all [facility] [facility] numbers 07/04/2018 4:44 PM

## 2018-07-05 ENCOUNTER — Encounter (HOSPITAL_COMMUNITY): Payer: Self-pay | Admitting: General Practice

## 2018-07-05 DIAGNOSIS — R531 Weakness: Secondary | ICD-10-CM | POA: Diagnosis not present

## 2018-07-05 DIAGNOSIS — Z992 Dependence on renal dialysis: Secondary | ICD-10-CM

## 2018-07-05 DIAGNOSIS — N186 End stage renal disease: Secondary | ICD-10-CM | POA: Diagnosis not present

## 2018-07-05 DIAGNOSIS — D631 Anemia in chronic kidney disease: Secondary | ICD-10-CM | POA: Diagnosis not present

## 2018-07-05 LAB — CBC
HCT: 32.8 % — ABNORMAL LOW (ref 36.0–46.0)
Hemoglobin: 10.3 g/dL — ABNORMAL LOW (ref 12.0–15.0)
MCH: 29.5 pg (ref 26.0–34.0)
MCHC: 31.4 g/dL (ref 30.0–36.0)
MCV: 94 fL (ref 80.0–100.0)
PLATELETS: 217 10*3/uL (ref 150–400)
RBC: 3.49 MIL/uL — ABNORMAL LOW (ref 3.87–5.11)
RDW: 20.9 % — ABNORMAL HIGH (ref 11.5–15.5)
WBC: 2.3 10*3/uL — ABNORMAL LOW (ref 4.0–10.5)
nRBC: 0 % (ref 0.0–0.2)

## 2018-07-05 LAB — BASIC METABOLIC PANEL
Anion gap: 15 (ref 5–15)
BUN: 47 mg/dL — ABNORMAL HIGH (ref 6–20)
CALCIUM: 6.8 mg/dL — AB (ref 8.9–10.3)
CO2: 27 mmol/L (ref 22–32)
Chloride: 97 mmol/L — ABNORMAL LOW (ref 98–111)
Creatinine, Ser: 8.41 mg/dL — ABNORMAL HIGH (ref 0.44–1.00)
GFR calc Af Amer: 7 mL/min — ABNORMAL LOW (ref >60)
GFR, EST NON AFRICAN AMERICAN: 6 mL/min — AB (ref >60)
Glucose, Bld: 77 mg/dL (ref 70–99)
Potassium: 4 mmol/L (ref 3.5–5.1)
Sodium: 139 mmol/L (ref 135–145)

## 2018-07-05 LAB — PROTIME-INR
INR: 1.39
PROTHROMBIN TIME: 16.9 s — AB (ref 11.4–15.2)

## 2018-07-05 LAB — HIV ANTIBODY (ROUTINE TESTING W REFLEX): HIV SCREEN 4TH GENERATION: NONREACTIVE

## 2018-07-05 LAB — CALCIUM, IONIZED: Calcium, Ionized, Serum: 4.1 mg/dL — ABNORMAL LOW (ref 4.5–5.6)

## 2018-07-05 MED ORDER — WARFARIN SODIUM 10 MG PO TABS: 10.0000 mg | ORAL_TABLET | Freq: Once | ORAL | Status: DC

## 2018-07-05 MED ORDER — TRAMADOL HCL 50 MG PO TABS
50.0000 mg | ORAL_TABLET | Freq: Once | ORAL | Status: AC
  Administered 2018-07-05: 50 mg via ORAL
  Filled 2018-07-05: qty 1

## 2018-07-05 MED ORDER — METOCLOPRAMIDE HCL 5 MG/ML IJ SOLN
5.0000 mg | Freq: Once | INTRAMUSCULAR | Status: AC
  Administered 2018-07-05: 5 mg via INTRAVENOUS
  Filled 2018-07-05: qty 2

## 2018-07-05 NOTE — Progress Notes (Signed)
Pt complaining of pain, text the doctor, and ultram is to be given per order

## 2018-07-05 NOTE — Progress Notes (Signed)
Pt alert and oriented x4, no complaints of pain or discomfort.  Bed in low position, call bell within reach.  Bed alarms on and functioning.  Assessment done and charted.  Will continue to monitor and do hourly rounding throughout the shift 

## 2018-07-05 NOTE — Care Management Obs Status (Signed)
[facility] NOTIFICATION   Patient Details  Name: Crystal Dunlap MRN: [id number] Date of Birth: 08-15-1990   Medicare Observation Status Notification Given:  Yes    Midge Minium RN, BSN, NCM-BC, ACM-RN [phone] 07/05/2018, 4:11 PM

## 2018-07-05 NOTE — Discharge Summary (Signed)
Physician Discharge Summary  Crystal Dunlap GLO:[id number] DOB: 1990/11/21 DOA: 07/04/2018  PCP: Henderson Baltimore, FNP  Admit date: 07/04/2018 Discharge date: 07/05/2018  Admitted From: home Discharge disposition: home   Recommendations for Outpatient Follow-Up:   Needs close monitoring of her INR- has not been at goal in months  Discharge Diagnosis:   Principal Problem:   Hypocalcemia Active Problems:   End stage renal disease on dialysis ([place])   History of DVT (deep vein thrombosis)   Anemia in ESRD (end-stage renal disease) ([place])   Essential hypertension    Discharge Condition: Improved.  Diet recommendation: renal  Wound care: None.  Code status: Full.   History of Present Illness:   Crystal Dunlap is a 28 y.o. 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  This AM, she was too weak to stand or walk and was hurting in the middle of her chest.  She has HD MWF and was dialyzed Friday without issue.  No similar issues.  Yesterday, she felt weak and tired all day.  She is uncertain why this happened.  She was given an antibiotic last week for PNA, but those symptoms have resolved.  She still feels somewhat weak but does seem to feel better now.  She is on the transplant list, having failed transplantation from 2007.    Hospital Course by Problem:   1. Generalized weakness - likely due to ^K/ hypocalcemia and missed dialysis- slowly improving 2. Hypocalcemia - Improved 5.7>6.8- renal to address at HD 3. Hyperkalemia - resolved with HD. 4. ESRD - on HD MWF.  HD yesterday, tolerated well, net UF 1.9L removed.  Has HD again in AM 5. Anemia of CKD- Hgb 10.3 6. Secondary hyperparathyroidism - hypocalcemia - continue using ^Ca bath w/HD.  Need phos. 7. HTN/volume - BP soft. Euvolemic on exam.  At EDW.   8. 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 Very flat affect  The results of significant diagnostics from this hospitalization (including imaging, microbiology, ancillary and laboratory) are listed below for reference.     Procedures and Diagnostic Studies:   Dg Chest Portable 1 View  Result Date: 07/04/2018 CLINICAL DATA:  Chest pain and weakness over the past 3 days. EXAM: PORTABLE CHEST 1 VIEW COMPARISON:  Two-view chest x-ray 06/23/2018 CT chest 12/24/2017. FINDINGS: The heart size is normal. There is no edema. A small right effusion is suggested. Minimal right basilar atelectasis is noted. The lungs are otherwise clear. Previously noted right middle lobe airspace disease has cleared. A left IJ dialysis  catheter is in place.  SVC stent is again noted. IMPRESSION: 1. Small right pleural effusion and minimal right basilar atelectasis or scarring. 2. Interval clearing of previous right middle lobe airspace disease. 3. Dialysis catheter is stable. Electronically Signed   By: San Morelle M.D.   On: 07/04/2018 07:29     Labs:   Basic Metabolic Panel: Recent Labs  Lab 07/04/18 0816 07/05/18 0611  NA 137 139  K 6.2* 4.0  CL 101 97*  CO2 16* 27  GLUCOSE 78 77  BUN 104* 47*  CREATININE 12.65* 8.41*  CALCIUM 5.7* 6.8*   GFR Estimated Creatinine Clearance: 7.2 mL/min (A) (by C-G formula based on SCr of 8.41 mg/dL  (H)). Liver Function Tests: Recent Labs  Lab 07/04/18 0816  AST 46*  ALT 33  ALKPHOS 133*  BILITOT 0.6  PROT 7.5  ALBUMIN 4.1   Recent Labs  Lab 07/04/18 0816  LIPASE 45   No results for input(s): AMMONIA in the last 168 hours. Coagulation profile Recent Labs  Lab 07/04/18 0815 07/05/18 0611  INR 1.35 1.39    CBC: Recent Labs  Lab 07/04/18 0815 07/05/18 0611  WBC 2.5* 2.3*  NEUTROABS 1.4*  --   HGB 10.7* 10.3*  HCT 34.8* 32.8*  MCV 96.7 94.0  PLT 257 217   Cardiac Enzymes: Recent Labs  Lab 07/04/18 0815  TROPONINI <0.03   BNP: Invalid input(s): POCBNP CBG: Recent Labs  Lab 07/04/18 0922  GLUCAP 71   D-Dimer No results for input(s): DDIMER in the last 72 hours. Hgb A1c No results for input(s): HGBA1C in the last 72 hours. Lipid Profile No results for input(s): CHOL, HDL, LDLCALC, TRIG, CHOLHDL, LDLDIRECT in the last 72 hours. Thyroid function studies No results for input(s): TSH, T4TOTAL, T3FREE, THYROIDAB in the last 72 hours.  Invalid input(s): FREET3 Anemia work up No results for input(s): VITAMINB12, FOLATE, FERRITIN, TIBC, IRON, RETICCTPCT in the last 72 hours. Microbiology Recent Results (from the past 240 hour(s))  Blood culture (routine x 2)     Status: None (Preliminary result)   Collection Time: 07/04/18  8:15 AM  Result Value Ref Range Status   Specimen Description   Final    BLOOD RIGHT ARM Performed at [facility], [address] [place], [place] [address]    Special Requests   Final    BOTTLES DRAWN AEROBIC AND ANAEROBIC Blood Culture adequate volume Performed at [facility], [address] [place], [place] [address]    Culture   Final    NO GROWTH 1 DAY Performed at [facility], [place] [address] [place], [place] [address]    Report Status PENDING  Incomplete     Discharge Instructions:   Discharge Instructions    Discharge instructions   Complete by:  As directed    Need to have INR  checked on Friday and coumadin adjusted for goal of 2-3 Small frequent meals if nauseous HD on schedule   Increase activity slowly   Complete by:  As directed      Allergies as of 07/05/2018      Reactions   Cefazolin Anaphylaxis   Ferrlecit [na Ferric Gluc Cplx In Sucrose] Anaphylaxis   Anaphylactic shock/severe angioedema on 08/09/15 related to IV iron Ferrlecit IV)  required intubation for respiratory failure   Cyclosporine Other (See Comments)   Loss of motor skills   Lactose Intolerance (gi) Nausea Only   Stomach pains also   Latex  Rash   Vancomycin Rash      Medication List    TAKE these medications   CRESTOR 10 MG tablet Generic drug:  rosuvastatin Take 10 mg by mouth daily.   diphenhydrAMINE 25 mg capsule Commonly known as:  BENADRYL Take 1 capsule (25 mg total) by mouth every 6 (six) hours as needed for allergies.   docusate sodium 100 MG capsule Commonly known as:  COLACE Take 200 mg by mouth daily.   EPINEPHrine 0.3 mg/0.3 mL Soaj injection Commonly known as:  EPI-PEN Inject 0.3 mLs (0.3 mg total) into the muscle once. What changed:    when to take this  reasons to take this   famotidine 20 MG tablet Commonly known as:  PEPCID Take 20 mg by mouth 2 (two) times daily.   folic acid 1 MG tablet Commonly known as:  FOLVITE Take 1 mg by mouth daily.   losartan 100 MG tablet Commonly known as:  COZAAR Take 100 mg by mouth daily.   multivitamin Tabs tablet Take 1 tablet by mouth daily.   sevelamer 800 MG tablet Commonly known as:  RENAGEL Take 800 mg by mouth 3 (three) times daily with meals.   Vitamin D3 50 MCG (2000 UT) capsule Take 2,000 Units by mouth daily.   warfarin 5 MG tablet Commonly known as:  COUMADIN Take 5 mg by mouth at bedtime. IN CONJUNCTION WITH THREE AND ONE-HALF 1 MG TABLETS TO EQUAL A TOTAL DOSAGE OF 8.5 MILLIGRAMS   warfarin 1 MG tablet Commonly known as:  COUMADIN Take 3.5 mg by mouth at bedtime. IN CONJUNCTION WITH ONE 5  MG TABLET TO EQUAL A TOTAL DOSAGE OF 8.5 MILLIGRAMS      Follow-up Information    Cousins, Melissa A, FNP Follow up in 1 week(s).   Specialty:  Nurse Practitioner Why:  for INR check on FRiday-- coumadin needs to be monitored closely Contact information: [address] [facility] [place] [address] [phone]            Time coordinating discharge: 25 min  Signed:  Geradine Girt DO  [facility] 07/05/2018, 4:34 PM

## 2018-07-05 NOTE — Progress Notes (Signed)
ANTICOAGULATION CONSULT NOTE - Initial Consult  Pharmacy Consult for Warfarin Indication: Hx of pulmonary embolus  Allergies  Allergen Reactions  . Cefazolin Anaphylaxis  . Ferrlecit [Na Ferric Gluc Cplx In Sucrose] Anaphylaxis    Anaphylactic shock/severe angioedema on 08/09/15 related to IV iron Ferrlecit IV)  required intubation for respiratory failure  . Cyclosporine Other (See Comments)    Loss of motor skills  . Lactose Intolerance (Gi) Nausea Only    Stomach pains also  . Latex Rash  . Vancomycin Rash    Patient Measurements: Height: 5\' 2"  (157.5 cm) Weight: (declined to stand up to weigh) IBW/kg (Calculated) : 50.1  Vital Signs: Temp: 97.7 F (36.5 C) (01/07 0549) Temp Source: Oral (01/07 0549) BP: 99/76 (01/07 0549) Pulse Rate: 64 (01/07 0549)  Labs: Recent Labs    07/04/18 0815 07/04/18 0816 07/05/18 0611  HGB 10.7*  --  10.3*  HCT 34.8*  --  32.8*  PLT 257  --  217  LABPROT 16.6*  --  16.9*  INR 1.35  --  1.39  CREATININE  --  12.65* 8.41*  TROPONINI <0.03  --   --     Estimated Creatinine Clearance: 7.2 mL/min (A) (by C-G formula based on SCr of 8.41 mg/dL (H)).   Medical History: Past Medical History:  Diagnosis Date  . Anemia   . ESRD (end stage renal disease) on dialysis ([place]) 08/10/2008   MWF; [facility]; [place]. 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 on HD since 2010. TDC dependent for several yrs.    . Focal segmental glomerulosclerosis   . GERD (gastroesophageal reflux disease)   . Hyperlipidemia   . Hypertension   . Pulmonary emboli ([place])    x2, on Coumadin lifelong  . Secondary hyperparathyroidism ([place])   . SIRS (systemic inflammatory response syndrome) ([place]) 11/23/2016    Assessment: 28 YOF who presented to the [facility] on 1/6 with generalized weakness for 2 days. The patient was on warfarin PTA for hx PE - admit INR 1.35 on PTA dose of 8.5  mg/day. Pharmacy consulted to resume dosing this admit.   INR today 1.39  Goal of Therapy:  INR 2-3 Monitor platelets by anticoagulation protocol: Yes   Plan:  - Warfarin 10 mg x 1 dose at 1800 today - Will continue to monitor for any signs/symptoms of bleeding and will follow up with PT/INR in the a.m.  Kwali Wrinkle A. Levada Dy, PharmD, [place] Pager: [phone] Please utilize Amion for appropriate phone number to reach the unit pharmacist ([place])   07/05/2018 7:49 AM

## 2018-07-05 NOTE — Progress Notes (Addendum)
[facility] Progress Note   Subjective:   Seen and examined at bedside. States she is feeling better, weakness improved.  Denies CP, SOB, n/v/d, and perioral/extremity numbness and tingling.  Objective Vitals:   07/04/18 1730 07/04/18 1758 07/04/18 2147 07/05/18 0549  BP: 105/66 121/90 91/66 99/76   Pulse: (!) 57 (!) 51 67 64  Resp: (!) 21 16 16 18   Temp: 98.2 F (36.8 C) 98.4 F (36.9 C) 97.7 F (36.5 C) 97.7 F (36.5 C)  TempSrc: Oral Oral Oral Oral  SpO2: 97% 97% 94% 95%  Weight:      Height:       Physical Exam General:NAD, WDWN, female Heart:RRR, no MRG Lungs:CTAB Abdomen:soft, NTND Extremities:no LE edema Dialysis Access: R IJ [facility]   Filed Weights   07/04/18 0654  Weight: 46 kg    Intake/Output Summary (Last 24 hours) at 07/05/2018 1104 Last data filed at 07/04/2018 1705 Gross per 24 hour  Intake -  Output 1907 ml  Net -1907 ml    Additional Objective Labs: Basic Metabolic Panel: Recent Labs  Lab 07/04/18 0816 07/05/18 0611  NA 137 139  K 6.2* 4.0  CL 101 97*  CO2 16* 27  GLUCOSE 78 77  BUN 104* 47*  CREATININE 12.65* 8.41*  CALCIUM 5.7* 6.8*   Liver Function Tests: Recent Labs  Lab 07/04/18 0816  AST 46*  ALT 33  ALKPHOS 133*  BILITOT 0.6  PROT 7.5  ALBUMIN 4.1   Recent Labs  Lab 07/04/18 0816  LIPASE 45   CBC: Recent Labs  Lab 07/04/18 0815 07/05/18 0611  WBC 2.5* 2.3*  NEUTROABS 1.4*  --   HGB 10.7* 10.3*  HCT 34.8* 32.8*  MCV 96.7 94.0  PLT 257 217   Blood Culture    Component Value Date/Time   SDES  07/04/2018 0815    BLOOD RIGHT ARM Performed at [facility], [address], [place], [place] [address]    [facility]  07/04/2018 0815    BOTTLES DRAWN AEROBIC AND ANAEROBIC Blood Culture adequate volume Performed at [facility], [address] [place], [place] [address]    CULT  07/04/2018 0815    NO GROWTH < 24 HOURS Performed at [facility], [place] [address]  [place], [place] [address]    REPTSTATUS PENDING 07/04/2018 0815    Cardiac Enzymes: Recent Labs  Lab 07/04/18 0815  TROPONINI <0.03   CBG: Recent Labs  Lab 07/04/18 0922  GLUCAP 24    Lab Results  Component Value Date   INR 1.39 07/05/2018   INR 1.35 07/04/2018   INR 1.94 06/23/2018   Studies/Results: Dg Chest Portable 1 View  Result Date: 07/04/2018 CLINICAL DATA:  Chest pain and weakness over the past 3 days. EXAM: PORTABLE CHEST 1 VIEW COMPARISON:  Two-view chest x-ray 06/23/2018 CT chest 12/24/2017. FINDINGS: The heart size is normal. There is no edema. A small right effusion is suggested. Minimal right basilar atelectasis is noted. The lungs are otherwise clear. Previously noted right middle lobe airspace disease has cleared. A left IJ dialysis catheter is in place.  SVC stent is again noted. IMPRESSION: 1. Small right pleural effusion and minimal right basilar atelectasis or scarring. 2. Interval clearing of previous right middle lobe airspace disease. 3. Dialysis catheter is stable. Electronically Signed   By: San Morelle M.D.   On: 07/04/2018 07:29    Medications: . calcium gluconate     . Chlorhexidine Gluconate Cloth  6 each Topical Q0600  . docusate sodium  100 mg Oral BID  . famotidine  20 mg Oral QHS  . folic acid  1 mg Oral Daily  . heparin  3,000 Units Dialysis Once in dialysis  . heparin  4,000 Units Intracatheter Q M,W,F-HD  . losartan  100 mg Oral Daily  . multivitamin  1 tablet Oral QHS  . rosuvastatin  10 mg Oral q1800  . sevelamer carbonate  800 mg Oral TID WC  . warfarin  10 mg Oral ONCE-1800  . Warfarin - Pharmacist Dosing Inpatient   Does not apply q1800    Dialysis Orders: Dialysis: MWF [facility]  3.5h   46kg   2/2.5 bath  TDC L  Hep 3000  Assessment/Plan: 1. Generalized weakness - likely due to ^K/ hypocalcemia and missed dialysis. 2. Hypocalcemia - Improved 5.7>6.8.  Continue using [place] bath w/HD. 3. Hyperkalemia - resolved with  HD. 4. ESRD - on HD MWF.  HD yesterday, tolerated well, net UF 1.9L removed. If remains inpatient will write orders for HD tomorrow per regular schedule.  OK for dc from renal standpoint.  5. Anemia of CKD- Hgb 10.3. No indication for ESA at this time.  6. Secondary hyperparathyroidism - hypocalcemia - continue using ^Ca bath w/HD.  Need phos. 7. HTN/volume - BP soft. Euvolemic on exam.  At EDW.   8. Nutrition - Renal diet w/fluid restrictions. Nepro. Renal Vitamin 9. Hx DVT - on coumadin per pharm  Jen Mow, PA-C [facility] Pager: [phone] 07/05/2018,11:04 AM  LOS: 0 days    Pt seen, examined, agree w assess/plan as above with additions as indicated.  Kelly Splinter MD [facility] pager [phone]    cell [phone] 07/05/2018, 1:10 PM

## 2018-07-05 NOTE — Progress Notes (Signed)
Discharge: Pt d/c from room via wheelchair, Family member with the pt. Discharge instructions given to the patient and family members.  No questions from pt, reintegrated to the pt to call or go to the ED for chest discomfort. Pt dressed in street clothes and left with discharge papers and prescriptions in hand. IV d/ced, tele removed and no complaints of pain or discomfort. 

## 2018-07-09 LAB — CULTURE, BLOOD (ROUTINE X 2)
Culture: NO GROWTH
Special Requests: ADEQUATE

## 2018-12-07 ENCOUNTER — Other Ambulatory Visit: Payer: Self-pay | Admitting: Nephrology

## 2018-12-07 ENCOUNTER — Ambulatory Visit
Admission: RE | Admit: 2018-12-07 | Discharge: 2018-12-07 | Disposition: A | Discharge status: Home / Self Care | Payer: Medicare Other | Source: Ambulatory Visit | Attending: Nephrology | Admitting: Nephrology

## 2018-12-07 DIAGNOSIS — M79673 Pain in unspecified foot: Secondary | ICD-10-CM

## 2019-12-24 ENCOUNTER — Emergency Department (HOSPITAL_BASED_OUTPATIENT_CLINIC_OR_DEPARTMENT_OTHER): Payer: Medicare Other

## 2019-12-24 ENCOUNTER — Inpatient Hospital Stay (HOSPITAL_BASED_OUTPATIENT_CLINIC_OR_DEPARTMENT_OTHER)
Admit: 2019-12-24 | Discharge: 2020-01-02 | DRG: 202 | Disposition: A | Discharge status: Home / Self Care | Payer: Medicare Other | Attending: Internal Medicine | Admitting: Internal Medicine

## 2019-12-24 ENCOUNTER — Other Ambulatory Visit: Payer: Self-pay

## 2019-12-24 ENCOUNTER — Encounter (HOSPITAL_BASED_OUTPATIENT_CLINIC_OR_DEPARTMENT_OTHER): Payer: Self-pay | Admitting: Emergency Medicine

## 2019-12-24 DIAGNOSIS — J209 Acute bronchitis, unspecified: Secondary | ICD-10-CM | POA: Diagnosis not present

## 2019-12-24 DIAGNOSIS — Z79899 Other long term (current) drug therapy: Secondary | ICD-10-CM

## 2019-12-24 DIAGNOSIS — Z20822 Contact with and (suspected) exposure to covid-19: Secondary | ICD-10-CM | POA: Diagnosis present

## 2019-12-24 DIAGNOSIS — Z992 Dependence on renal dialysis: Secondary | ICD-10-CM

## 2019-12-24 DIAGNOSIS — I120 Hypertensive chronic kidney disease with stage 5 chronic kidney disease or end stage renal disease: Secondary | ICD-10-CM | POA: Diagnosis present

## 2019-12-24 DIAGNOSIS — I82441 Acute embolism and thrombosis of right tibial vein: Secondary | ICD-10-CM | POA: Diagnosis not present

## 2019-12-24 DIAGNOSIS — I8500 Esophageal varices without bleeding: Secondary | ICD-10-CM | POA: Diagnosis present

## 2019-12-24 DIAGNOSIS — Z86718 Personal history of other venous thrombosis and embolism: Secondary | ICD-10-CM

## 2019-12-24 DIAGNOSIS — R52 Pain, unspecified: Secondary | ICD-10-CM

## 2019-12-24 DIAGNOSIS — I82409 Acute embolism and thrombosis of unspecified deep veins of unspecified lower extremity: Secondary | ICD-10-CM

## 2019-12-24 DIAGNOSIS — D72819 Decreased white blood cell count, unspecified: Secondary | ICD-10-CM | POA: Diagnosis present

## 2019-12-24 DIAGNOSIS — R042 Hemoptysis: Secondary | ICD-10-CM | POA: Diagnosis present

## 2019-12-24 DIAGNOSIS — R109 Unspecified abdominal pain: Secondary | ICD-10-CM

## 2019-12-24 DIAGNOSIS — Z91040 Latex allergy status: Secondary | ICD-10-CM

## 2019-12-24 DIAGNOSIS — Z7901 Long term (current) use of anticoagulants: Secondary | ICD-10-CM

## 2019-12-24 DIAGNOSIS — E875 Hyperkalemia: Secondary | ICD-10-CM

## 2019-12-24 DIAGNOSIS — J9811 Atelectasis: Secondary | ICD-10-CM | POA: Diagnosis present

## 2019-12-24 DIAGNOSIS — I82451 Acute embolism and thrombosis of right peroneal vein: Secondary | ICD-10-CM | POA: Diagnosis not present

## 2019-12-24 DIAGNOSIS — M545 Low back pain: Secondary | ICD-10-CM | POA: Diagnosis not present

## 2019-12-24 DIAGNOSIS — M7981 Nontraumatic hematoma of soft tissue: Secondary | ICD-10-CM | POA: Diagnosis not present

## 2019-12-24 DIAGNOSIS — K219 Gastro-esophageal reflux disease without esophagitis: Secondary | ICD-10-CM | POA: Diagnosis present

## 2019-12-24 DIAGNOSIS — R05 Cough: Secondary | ICD-10-CM | POA: Diagnosis not present

## 2019-12-24 DIAGNOSIS — T45515A Adverse effect of anticoagulants, initial encounter: Secondary | ICD-10-CM | POA: Diagnosis not present

## 2019-12-24 DIAGNOSIS — T82599A Other mechanical complication of unspecified cardiac and vascular devices and implants, initial encounter: Secondary | ICD-10-CM | POA: Diagnosis present

## 2019-12-24 DIAGNOSIS — R059 Cough, unspecified: Secondary | ICD-10-CM

## 2019-12-24 DIAGNOSIS — Y92239 Unspecified place in hospital as the place of occurrence of the external cause: Secondary | ICD-10-CM | POA: Diagnosis not present

## 2019-12-24 DIAGNOSIS — Z881 Allergy status to other antibiotic agents status: Secondary | ICD-10-CM

## 2019-12-24 DIAGNOSIS — I10 Essential (primary) hypertension: Secondary | ICD-10-CM | POA: Diagnosis present

## 2019-12-24 DIAGNOSIS — T8612 Kidney transplant failure: Secondary | ICD-10-CM | POA: Diagnosis present

## 2019-12-24 DIAGNOSIS — Z888 Allergy status to other drugs, medicaments and biological substances status: Secondary | ICD-10-CM

## 2019-12-24 DIAGNOSIS — I083 Combined rheumatic disorders of mitral, aortic and tricuspid valves: Secondary | ICD-10-CM | POA: Diagnosis present

## 2019-12-24 DIAGNOSIS — D500 Iron deficiency anemia secondary to blood loss (chronic): Secondary | ICD-10-CM | POA: Diagnosis present

## 2019-12-24 DIAGNOSIS — Y838 Other surgical procedures as the cause of abnormal reaction of the patient, or of later complication, without mention of misadventure at the time of the procedure: Secondary | ICD-10-CM | POA: Diagnosis present

## 2019-12-24 DIAGNOSIS — I959 Hypotension, unspecified: Secondary | ICD-10-CM | POA: Diagnosis present

## 2019-12-24 DIAGNOSIS — I871 Compression of vein: Secondary | ICD-10-CM

## 2019-12-24 DIAGNOSIS — Z905 Acquired absence of kidney: Secondary | ICD-10-CM

## 2019-12-24 DIAGNOSIS — I82411 Acute embolism and thrombosis of right femoral vein: Secondary | ICD-10-CM | POA: Diagnosis not present

## 2019-12-24 DIAGNOSIS — E739 Lactose intolerance, unspecified: Secondary | ICD-10-CM | POA: Diagnosis present

## 2019-12-24 DIAGNOSIS — R911 Solitary pulmonary nodule: Secondary | ICD-10-CM | POA: Diagnosis present

## 2019-12-24 DIAGNOSIS — Z86711 Personal history of pulmonary embolism: Secondary | ICD-10-CM | POA: Diagnosis present

## 2019-12-24 DIAGNOSIS — J385 Laryngeal spasm: Secondary | ICD-10-CM | POA: Diagnosis not present

## 2019-12-24 DIAGNOSIS — T82898A Other specified complication of vascular prosthetic devices, implants and grafts, initial encounter: Secondary | ICD-10-CM | POA: Diagnosis present

## 2019-12-24 DIAGNOSIS — N2581 Secondary hyperparathyroidism of renal origin: Secondary | ICD-10-CM | POA: Diagnosis present

## 2019-12-24 DIAGNOSIS — E8889 Other specified metabolic disorders: Secondary | ICD-10-CM | POA: Diagnosis present

## 2019-12-24 DIAGNOSIS — N186 End stage renal disease: Secondary | ICD-10-CM

## 2019-12-24 DIAGNOSIS — M79651 Pain in right thigh: Secondary | ICD-10-CM | POA: Diagnosis not present

## 2019-12-24 DIAGNOSIS — D6859 Other primary thrombophilia: Secondary | ICD-10-CM | POA: Diagnosis present

## 2019-12-24 DIAGNOSIS — E785 Hyperlipidemia, unspecified: Secondary | ICD-10-CM | POA: Diagnosis present

## 2019-12-24 LAB — CBC WITH DIFFERENTIAL/PLATELET
Abs Immature Granulocytes: 0.01 10*3/uL (ref 0.00–0.07)
Basophils Absolute: 0 10*3/uL (ref 0.0–0.1)
Basophils Relative: 2 %
Eosinophils Absolute: 0.2 10*3/uL (ref 0.0–0.5)
Eosinophils Relative: 6 %
HCT: 41.5 % (ref 36.0–46.0)
Hemoglobin: 12.5 g/dL (ref 12.0–15.0)
Immature Granulocytes: 0 %
Lymphocytes Relative: 22 %
Lymphs Abs: 0.6 10*3/uL — ABNORMAL LOW (ref 0.7–4.0)
MCH: 29.2 pg (ref 26.0–34.0)
MCHC: 30.1 g/dL (ref 30.0–36.0)
MCV: 97 fL (ref 80.0–100.0)
Monocytes Absolute: 0.4 10*3/uL (ref 0.1–1.0)
Monocytes Relative: 15 %
Neutro Abs: 1.4 10*3/uL — ABNORMAL LOW (ref 1.7–7.7)
Neutrophils Relative %: 55 %
Platelets: 190 10*3/uL (ref 150–400)
RBC: 4.28 MIL/uL (ref 3.87–5.11)
RDW: 15.9 % — ABNORMAL HIGH (ref 11.5–15.5)
WBC: 2.6 10*3/uL — ABNORMAL LOW (ref 4.0–10.5)
nRBC: 0 % (ref 0.0–0.2)

## 2019-12-24 LAB — SARS CORONAVIRUS 2 BY RT PCR (HOSPITAL ORDER, PERFORMED IN ~~LOC~~ HOSPITAL LAB): SARS Coronavirus 2: NEGATIVE

## 2019-12-24 LAB — PROTIME-INR
INR: 1.9 — ABNORMAL HIGH (ref 0.8–1.2)
Prothrombin Time: 21.3 seconds — ABNORMAL HIGH (ref 11.4–15.2)

## 2019-12-24 LAB — HCG, SERUM, QUALITATIVE: Preg, Serum: NEGATIVE

## 2019-12-24 LAB — LIPASE, BLOOD: Lipase: 63 U/L — ABNORMAL HIGH (ref 11–51)

## 2019-12-24 MED ORDER — HYDROCODONE-ACETAMINOPHEN 5-325 MG PO TABS
1.0000 | ORAL_TABLET | Freq: Once | ORAL | Status: DC
  Filled 2019-12-24: qty 1

## 2019-12-24 MED ORDER — HYDROCODONE-ACETAMINOPHEN 7.5-325 MG/15ML PO SOLN
10.0000 mL | Freq: Once | ORAL | Status: AC
  Administered 2019-12-24: 10 mL via ORAL
  Filled 2019-12-24: qty 15

## 2019-12-24 MED ORDER — BENZONATATE 100 MG PO CAPS: 100.0000 mg | ORAL_CAPSULE | Freq: Three times a day (TID) | ORAL | 0 refills | Status: AC

## 2019-12-24 NOTE — ED Triage Notes (Signed)
Pt reports hemoptysis x1 hour. States no pain, states amount of blood worried her.

## 2019-12-24 NOTE — ED Notes (Signed)
RN at bedside to give medication. Oxygen saturation 88%-93% on RA. Pt encouraged to breathe deeply, but began coughing again. Pt states she cannot swallow a pill and is requesting medications in liquid form. Pt placed on 2L oxygen via [place] due to sustained saturation of 88%. Saturation increased to 95% on 2L. Pt continues to deny CP or SHOB. EDP made aware.

## 2019-12-24 NOTE — ED Notes (Signed)
EDP, PA and RN to bedside. Pt coughing up blood into emesis bag.

## 2019-12-24 NOTE — ED Provider Notes (Addendum)
[facility] EMERGENCY DEPARTMENT Provider Note   CSN: [id number] Arrival date & time: 12/24/19  2014     History Chief Complaint  Patient presents with  . Hemoptysis    Crystal Dunlap is a 29 y.o. 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  Patient states she was coughing continuously and had 2-3 episodes of hemoptysis. She admits to blood mixed with yellow phlegm. She admits to having URI symptoms of productive cough and nasal congestion since Monday.  Denies sick contacts and known Covid exposures.  She has received both her Covid vaccines.  Denies associated abdominal pain and diarrhea.  She admits to having a fever of 100 F on Friday, but none since.  She is currently on warfarin for history of PE.  Patient is not currently produce urine due to ESRD.  Denies vaginal symptoms.  Denies chest pain and shortness of breath.  Denies lower extremity edema.  No treatment prior to arrival.  No aggravating or alleviating symptoms.  Denies melena and hematochezia.  History obtained from patient and past medical records. No interpreter used during encounter.      Past Medical History:  Diagnosis Date  . Anemia   . ESRD (end stage renal disease) on dialysis ([place]) 08/10/2008   MWF; [facility]; [place]. 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 on HD since 2010. TDC dependent for several yrs.    . Focal segmental glomerulosclerosis   . GERD (gastroesophageal reflux disease)   . Hyperlipidemia   . Hypertension   . 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 Secondary hyperparathyroidism ([place])   . SIRS (systemic inflammatory response  syndrome) ([place]) 11/23/2016    Patient Active Problem List   Diagnosis Date Noted  . Hypocalcemia 07/04/2018  . Acute upper respiratory infection   . Multifocal pneumonia 11/25/2016  . FUO (fever of unknown origin) 11/23/2016  . Sepsis due to undetermined organism ([place]) 11/23/2016  . History of DVT (deep vein thrombosis) 11/23/2016  . Anemia in ESRD (end-stage renal disease) ([place]) 11/23/2016  . Thrombocytopenia ([place]) 11/23/2016  . Essential hypertension 11/23/2016  . Respiratory failure ([place])   . Allergic reaction 08/09/2015  . Anaphylaxis 08/09/2015  . 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 End stage renal disease on dialysis ([place]) 08/09/2015  . Acute respiratory failure with hypoxia ([place]) 08/09/2015    Past Surgical History:  Procedure Laterality Date  . COLON SURGERY  ~ 2011/2012   "for bowel obstruction"  . INSERTION OF DIALYSIS CATHETER Left ~ 2016   chest  . KIDNEY TRANSPLANT  2007     OB History   No obstetric history on file.     History reviewed. No pertinent family history.  Social History   Tobacco Use  . Smoking status: Never Smoker  . Smokeless tobacco: Never Used  Vaping Use  . Vaping Use: Never used  Substance Use Topics  . Alcohol use: No  . Drug use: No    Home Medications Prior to Admission medications   Medication Sig Start Date End Date Taking? Authorizing Provider  benzonatate (TESSALON) 100 MG capsule Take 1 capsule (100 mg total) by mouth every 8 (eight)  hours. 12/24/19   Suzy Bouchard, PA-C  Cholecalciferol (VITAMIN D3) 2000 units capsule Take 2,000 Units by mouth daily. 05/08/16   [provider]  diphenhydrAMINE (BENADRYL) 25 mg capsule Take 1 capsule (25 mg total) by mouth every 6 (six) hours as needed for allergies. 08/14/15   Ghimire, Henreitta Leber, MD  docusate sodium (COLACE) 100 MG capsule Take 200 mg by mouth daily.    [provider]  EPINEPHrine 0.3 mg/0.3 mL IJ SOAJ injection Inject 0.3 mLs (0.3 mg total) into  the muscle once. Patient taking differently: Inject 0.3 mg into the muscle once as needed (FOR ANAPHYLAXIS).  08/16/15   Vira Blanco, MD  famotidine (PEPCID) 20 MG tablet Take 20 mg by mouth 2 (two) times daily.    [provider]  folic acid (FOLVITE) 1 MG tablet Take 1 mg by mouth daily.    [provider]  losartan (COZAAR) 100 MG tablet Take 100 mg by mouth daily.    [provider]  multivitamin (RENA-VIT) TABS tablet Take 1 tablet by mouth daily. 02/12/15   [provider]  rosuvastatin (CRESTOR) 10 MG tablet Take 10 mg by mouth daily.    [provider]  sevelamer (RENAGEL) 800 MG tablet Take 800 mg by mouth 3 (three) times daily with meals.    [provider]  warfarin (COUMADIN) 1 MG tablet Take 3.5 mg by mouth at bedtime. IN CONJUNCTION WITH ONE 5 MG TABLET TO EQUAL A TOTAL DOSAGE OF 8.5 MILLIGRAMS    [provider]  warfarin (COUMADIN) 5 MG tablet Take 5 mg by mouth at bedtime. IN CONJUNCTION WITH THREE AND ONE-HALF 1 MG TABLETS TO EQUAL A TOTAL DOSAGE OF 8.5 MILLIGRAMS    [provider]    Allergies    Cefazolin, Ferrlecit [na ferric gluc cplx in sucrose], Cyclosporine, Lactose intolerance (gi), Latex, and Vancomycin  Review of Systems   Review of Systems  Constitutional: Positive for fever (resolved). Negative for chills.  HENT: Positive for congestion.   Respiratory: Positive for cough. Negative for shortness of breath.   Cardiovascular: Negative for chest pain and leg swelling.  Gastrointestinal: Positive for nausea and vomiting. Negative for abdominal pain and diarrhea.  All other systems reviewed and are negative.   Physical Exam Updated Vital Signs BP 106/74   Pulse 79   Temp 98.9 F (37.2 C)   Resp (!) 22   Wt 48.4 kg   LMP  (LMP Unknown)   SpO2 93%   BMI 19.50 kg/m   Physical Exam Vitals and nursing note reviewed.  Constitutional:      General: She is not in acute distress.     Appearance: She is not ill-appearing.  HENT:     Head: Normocephalic.  Eyes:     Pupils: Pupils are equal, round, and reactive to light.  Cardiovascular:     Rate and Rhythm: Normal rate and regular rhythm.     Pulses: Normal pulses.     Heart sounds: Normal heart sounds. No murmur heard.  No friction rub. No gallop.   Pulmonary:     Effort: Pulmonary effort is normal.     Breath sounds: Normal breath sounds.     Comments: Respirations equal and unlabored, patient able to speak in full sentences, lungs clear to auscultation bilaterally Abdominal:     General: Abdomen is flat. Bowel sounds are normal. There is no distension.     Palpations: Abdomen is soft.     Tenderness: There  is no abdominal tenderness. There is no guarding or rebound.     Comments: Abdomen soft, nondistended, nontender to palpation in all quadrants without guarding or peritoneal signs. No rebound.   Musculoskeletal:     Cervical back: Neck supple.     Comments: No lower extremity edema.  Negative Homan sign bilaterally.   Skin:    General: Skin is warm and dry.  Neurological:     General: No focal deficit present.     Mental Status: She is alert.  Psychiatric:        Mood and Affect: Mood normal.        Behavior: Behavior normal.     ED Results / Procedures / Treatments   Labs (all labs ordered are listed, but only abnormal results are displayed) Labs Reviewed  CBC WITH DIFFERENTIAL/PLATELET - Abnormal; Notable for the following components:      Result Value   WBC 2.6 (*)    RDW 15.9 (*)    Neutro Abs 1.4 (*)    Lymphs Abs 0.6 (*)    All other components within normal limits  LIPASE, BLOOD - Abnormal; Notable for the following components:   Lipase 63 (*)    All other components within normal limits  PROTIME-INR - Abnormal; Notable for the following components:   Prothrombin Time 21.3 (*)    INR 1.9 (*)    All other components within normal limits  SARS CORONAVIRUS 2 BY RT PCR (HOSPITAL ORDER,  [place] [facility])  HCG, SERUM, QUALITATIVE    EKG None  Radiology DG Chest Portable 1 View  Result Date: 12/24/2019 CLINICAL DATA:  Cough. EXAM: PORTABLE CHEST 1 VIEW COMPARISON:  July 04, 2018 FINDINGS: There is a well-positioned left-sided tunneled dialysis catheter. A left brachiocephalic vein stent is noted. The heart size is stable. There is a small right-sided pleural effusion which is stable from prior study. There is no pneumothorax. No focal infiltrate. IMPRESSION: 1. Stable small right-sided pleural effusion. 2. Well-positioned left-sided tunneled dialysis catheter. Electronically Signed   By: Constance Holster M.D.   On: 12/24/2019 21:53    Procedures Procedures (including critical care time)  Medications Ordered in ED Medications  HYDROcodone-acetaminophen (NORCO/VICODIN) 5-325 MG per tablet 1 tablet (has no administration in time range)    ED Course  I have reviewed the triage vital signs and the nursing notes.  Pertinent labs & imaging results that were available during my care of the patient were reviewed by me and considered in my medical decision making (see chart for details).  Clinical Course as of Dec 23 2332  Nancy Fetter Dec 24, 2019  2133 INR(!): 1.9 [CA]  2134 WBC(!): 2.6 [CA]  2236 Lipase(!): 63 [CA]    Clinical Course User Index [CA] Suzy Bouchard, PA-C   MDM Rules/Calculators/A&P                         29 year old female presents to the ED due to 2-3 episodes of hemoptysis 1 hour prior to arrival.  Patient is currently on warfarin due to history of PE.  She admits to having URI symptoms that started on Monday associated with a productive cough.  Denies sick contacts and Covid exposures.  Patient has history of ESRD on dialysis Monday, Wednesday, and Friday with last dialysis being Friday.  Upon arrival, patient afebrile, not tachycardic or hypoxic.  Patient's blood pressure mildly elevated 136/107 likely due to ESRD. Low suspicion  for HTN  emergency/urgency.  Physical exam reassuring.  Abdomen soft, nondistended, nontender.  No lower extremity edema to suggest DVT.  Will obtain routine labs, chest x-ray, and EKG.  Also obtain Covid test to rule out Covid infection given URI symptoms earlier this week. Suspect hemoptysis related to bronchitis vs. PE.   CBC significant for leukopenia at 2.6 which appears better around patient's baseline.  INR slightly lower than therapeutic window at 1.9.  No clinical signs of DVT on exam. Will obtain CTA of chest to rule out PE given INR not within the therapeutic window. Covid test negative.  Pregnancy test negative.  Lipase mildly elevated at 63.  Patient is nontender in the epigastric region.  Low suspicion for pancreatitis.  Chest x-ray personally reviewed which demonstrates: IMPRESSION:  1. Stable small right-sided pleural effusion.  2. Well-positioned left-sided tunneled dialysis catheter.   EKG personally reviewed which demonstrates normal sinus rhythm with no signs of acute ischemia.  10:42 PM informed by RN that CMP continues to hemolyze. Redrawing 3rd sample now.   11:28 PM Call to bedside due to numerous episodes of hemoptysis. Dr. Johnney Killian and myself evaluated patient at bedside. Patient with harsh cough and coughing blood into emesis bag. No noticeable epistaxis.   Patient handed off to Dr. Dina Rich at shift change pending CMP and CTA.  Final Clinical Impression(s) / ED Diagnoses Final diagnoses:  Cough  Hemoptysis    Rx / DC Orders ED Discharge Orders         Ordered    benzonatate (TESSALON) 100 MG capsule  Every 8 hours     Discontinue  Reprint     12/24/19 [place], [place], PA-C 12/24/19 2333    Suzy Bouchard, PA-C 12/24/19 2334    Charlesetta Shanks, MD 12/24/19 [phone]

## 2019-12-24 NOTE — ED Notes (Addendum)
"  cold sx" (productive cough with yellow mucous) since Monday, states she was vomiting up blood today x 1 after coughing but "it was a pretty good amount". Pt is on Coumadin. Pt is a HD pt with dialysis MWF and last went on Friday. Denies abd pain. States fever Friday temp max 100.0, but none since. EDP at bedside. Denies nausea presently.

## 2019-12-25 ENCOUNTER — Emergency Department (HOSPITAL_BASED_OUTPATIENT_CLINIC_OR_DEPARTMENT_OTHER): Payer: Medicare Other

## 2019-12-25 ENCOUNTER — Encounter (HOSPITAL_BASED_OUTPATIENT_CLINIC_OR_DEPARTMENT_OTHER): Payer: Self-pay

## 2019-12-25 DIAGNOSIS — I10 Essential (primary) hypertension: Secondary | ICD-10-CM | POA: Diagnosis not present

## 2019-12-25 DIAGNOSIS — T8612 Kidney transplant failure: Secondary | ICD-10-CM | POA: Diagnosis present

## 2019-12-25 DIAGNOSIS — Z91040 Latex allergy status: Secondary | ICD-10-CM | POA: Diagnosis not present

## 2019-12-25 DIAGNOSIS — M7989 Other specified soft tissue disorders: Secondary | ICD-10-CM | POA: Diagnosis not present

## 2019-12-25 DIAGNOSIS — N186 End stage renal disease: Secondary | ICD-10-CM

## 2019-12-25 DIAGNOSIS — R05 Cough: Secondary | ICD-10-CM | POA: Diagnosis present

## 2019-12-25 DIAGNOSIS — E785 Hyperlipidemia, unspecified: Secondary | ICD-10-CM | POA: Diagnosis present

## 2019-12-25 DIAGNOSIS — J209 Acute bronchitis, unspecified: Secondary | ICD-10-CM | POA: Diagnosis present

## 2019-12-25 DIAGNOSIS — Z881 Allergy status to other antibiotic agents status: Secondary | ICD-10-CM | POA: Diagnosis not present

## 2019-12-25 DIAGNOSIS — Z992 Dependence on renal dialysis: Secondary | ICD-10-CM

## 2019-12-25 DIAGNOSIS — I8500 Esophageal varices without bleeding: Secondary | ICD-10-CM | POA: Diagnosis present

## 2019-12-25 DIAGNOSIS — I120 Hypertensive chronic kidney disease with stage 5 chronic kidney disease or end stage renal disease: Secondary | ICD-10-CM | POA: Diagnosis present

## 2019-12-25 DIAGNOSIS — Z86711 Personal history of pulmonary embolism: Secondary | ICD-10-CM | POA: Diagnosis not present

## 2019-12-25 DIAGNOSIS — R042 Hemoptysis: Secondary | ICD-10-CM | POA: Diagnosis present

## 2019-12-25 DIAGNOSIS — E875 Hyperkalemia: Secondary | ICD-10-CM | POA: Diagnosis present

## 2019-12-25 DIAGNOSIS — I82441 Acute embolism and thrombosis of right tibial vein: Secondary | ICD-10-CM | POA: Diagnosis not present

## 2019-12-25 DIAGNOSIS — I950 Idiopathic hypotension: Secondary | ICD-10-CM | POA: Diagnosis not present

## 2019-12-25 DIAGNOSIS — R918 Other nonspecific abnormal finding of lung field: Secondary | ICD-10-CM | POA: Diagnosis not present

## 2019-12-25 DIAGNOSIS — I82401 Acute embolism and thrombosis of unspecified deep veins of right lower extremity: Secondary | ICD-10-CM | POA: Diagnosis not present

## 2019-12-25 DIAGNOSIS — J9811 Atelectasis: Secondary | ICD-10-CM | POA: Diagnosis present

## 2019-12-25 DIAGNOSIS — D6859 Other primary thrombophilia: Secondary | ICD-10-CM | POA: Diagnosis present

## 2019-12-25 DIAGNOSIS — Y838 Other surgical procedures as the cause of abnormal reaction of the patient, or of later complication, without mention of misadventure at the time of the procedure: Secondary | ICD-10-CM | POA: Diagnosis present

## 2019-12-25 DIAGNOSIS — T82599A Other mechanical complication of unspecified cardiac and vascular devices and implants, initial encounter: Secondary | ICD-10-CM | POA: Diagnosis present

## 2019-12-25 DIAGNOSIS — T82898A Other specified complication of vascular prosthetic devices, implants and grafts, initial encounter: Secondary | ICD-10-CM | POA: Diagnosis present

## 2019-12-25 DIAGNOSIS — K219 Gastro-esophageal reflux disease without esophagitis: Secondary | ICD-10-CM | POA: Diagnosis present

## 2019-12-25 DIAGNOSIS — Z888 Allergy status to other drugs, medicaments and biological substances status: Secondary | ICD-10-CM | POA: Diagnosis not present

## 2019-12-25 DIAGNOSIS — Y92239 Unspecified place in hospital as the place of occurrence of the external cause: Secondary | ICD-10-CM | POA: Diagnosis not present

## 2019-12-25 DIAGNOSIS — Z20822 Contact with and (suspected) exposure to covid-19: Secondary | ICD-10-CM | POA: Diagnosis present

## 2019-12-25 DIAGNOSIS — I82411 Acute embolism and thrombosis of right femoral vein: Secondary | ICD-10-CM | POA: Diagnosis not present

## 2019-12-25 DIAGNOSIS — N2581 Secondary hyperparathyroidism of renal origin: Secondary | ICD-10-CM | POA: Diagnosis present

## 2019-12-25 DIAGNOSIS — Z7901 Long term (current) use of anticoagulants: Secondary | ICD-10-CM | POA: Diagnosis not present

## 2019-12-25 DIAGNOSIS — I82451 Acute embolism and thrombosis of right peroneal vein: Secondary | ICD-10-CM | POA: Diagnosis not present

## 2019-12-25 DIAGNOSIS — I959 Hypotension, unspecified: Secondary | ICD-10-CM | POA: Diagnosis present

## 2019-12-25 DIAGNOSIS — M79609 Pain in unspecified limb: Secondary | ICD-10-CM | POA: Diagnosis not present

## 2019-12-25 DIAGNOSIS — I739 Peripheral vascular disease, unspecified: Secondary | ICD-10-CM | POA: Diagnosis not present

## 2019-12-25 HISTORY — DX: Hyperlipidemia, unspecified: E78.5

## 2019-12-25 LAB — COMPREHENSIVE METABOLIC PANEL
ALT: 15 U/L (ref 0–44)
AST: 26 U/L (ref 15–41)
Albumin: 4.6 g/dL (ref 3.5–5.0)
Alkaline Phosphatase: 113 U/L (ref 38–126)
Anion gap: 19 — ABNORMAL HIGH (ref 5–15)
BUN: 61 mg/dL — ABNORMAL HIGH (ref 6–20)
CO2: 20 mmol/L — ABNORMAL LOW (ref 22–32)
Calcium: 7.3 mg/dL — ABNORMAL LOW (ref 8.9–10.3)
Chloride: 98 mmol/L (ref 98–111)
Creatinine, Ser: 13.39 mg/dL — ABNORMAL HIGH (ref 0.44–1.00)
GFR calc Af Amer: 4 mL/min — ABNORMAL LOW (ref >60)
GFR calc non Af Amer: 3 mL/min — ABNORMAL LOW (ref >60)
Glucose, Bld: 94 mg/dL (ref 70–99)
Potassium: 6.4 mmol/L (ref 3.5–5.1)
Sodium: 137 mmol/L (ref 135–145)
Total Bilirubin: 0.6 mg/dL (ref 0.3–1.2)
Total Protein: 8.7 g/dL — ABNORMAL HIGH (ref 6.5–8.1)

## 2019-12-25 LAB — HEMOGLOBIN AND HEMATOCRIT, BLOOD
HCT: 38.9 % (ref 36.0–46.0)
Hemoglobin: 11.9 g/dL — ABNORMAL LOW (ref 12.0–15.0)

## 2019-12-25 LAB — HEPARIN LEVEL (UNFRACTIONATED): Heparin Unfractionated: <0.1 IU/mL — ABNORMAL LOW (ref 0.30–0.70)

## 2019-12-25 LAB — MRSA PCR SCREENING: MRSA by PCR: NEGATIVE

## 2019-12-25 LAB — HIV ANTIBODY (ROUTINE TESTING W REFLEX): HIV Screen 4th Generation wRfx: NONREACTIVE

## 2019-12-25 MED ORDER — CHLORHEXIDINE GLUCONATE CLOTH 2 % EX PADS
6.0000 | MEDICATED_PAD | Freq: Every day | CUTANEOUS | Status: DC
  Administered 2019-12-27 – 2020-01-02 (×6): 6 via TOPICAL

## 2019-12-25 MED ORDER — DIPHENHYDRAMINE HCL 50 MG/ML IJ SOLN
25.0000 mg | INTRAMUSCULAR | Status: AC
  Administered 2019-12-25: 25 mg via INTRAVENOUS

## 2019-12-25 MED ORDER — SODIUM CHLORIDE 0.9% FLUSH
3.0000 mL | Freq: Two times a day (BID) | INTRAVENOUS | Status: DC
  Administered 2019-12-25 – 2020-01-01 (×9): 3 mL via INTRAVENOUS

## 2019-12-25 MED ORDER — CALCIUM CARBONATE ANTACID 1250 MG/5ML PO SUSP
500.0000 mg | Freq: Four times a day (QID) | ORAL | Status: DC | PRN
  Filled 2019-12-25: qty 5

## 2019-12-25 MED ORDER — HEPARIN (PORCINE) 25000 UT/250ML-% IV SOLN
700.0000 [IU]/h | INTRAVENOUS | Status: DC
  Administered 2019-12-25: 700 [IU]/h via INTRAVENOUS
  Filled 2019-12-25: qty 250

## 2019-12-25 MED ORDER — LOSARTAN POTASSIUM 50 MG PO TABS
100.0000 mg | ORAL_TABLET | Freq: Every day | ORAL | Status: DC
  Administered 2019-12-25: 100 mg via ORAL
  Filled 2019-12-25 (×2): qty 2

## 2019-12-25 MED ORDER — TRANEXAMIC ACID FOR INHALATION
500.0000 mg | Freq: Three times a day (TID) | RESPIRATORY_TRACT | Status: AC
  Administered 2019-12-25 – 2019-12-28 (×7): 500 mg via RESPIRATORY_TRACT
  Filled 2019-12-25 (×9): qty 10

## 2019-12-25 MED ORDER — VITAMIN D 25 MCG (1000 UNIT) PO TABS
2000.0000 [IU] | ORAL_TABLET | Freq: Every day | ORAL | Status: DC
  Administered 2019-12-25 – 2020-01-02 (×9): 2000 [IU] via ORAL
  Filled 2019-12-25 (×8): qty 2

## 2019-12-25 MED ORDER — SORBITOL 70 % SOLN
30.0000 mL | Status: DC | PRN
  Administered 2019-12-28 (×2): 30 mL via ORAL
  Filled 2019-12-25: qty 30

## 2019-12-25 MED ORDER — ONDANSETRON HCL 4 MG PO TABS: 4.0000 mg | ORAL_TABLET | Freq: Four times a day (QID) | ORAL | Status: DC | PRN

## 2019-12-25 MED ORDER — FOLIC ACID 1 MG PO TABS
1.0000 mg | ORAL_TABLET | Freq: Every day | ORAL | Status: DC
  Administered 2019-12-25 – 2020-01-02 (×9): 1 mg via ORAL
  Filled 2019-12-25 (×9): qty 1

## 2019-12-25 MED ORDER — ACETAMINOPHEN 650 MG RE SUPP
650.0000 mg | Freq: Four times a day (QID) | RECTAL | Status: DC | PRN
  Filled 2019-12-25: qty 1

## 2019-12-25 MED ORDER — FAMOTIDINE 20 MG PO TABS
20.0000 mg | ORAL_TABLET | Freq: Two times a day (BID) | ORAL | Status: DC
  Administered 2019-12-25 – 2019-12-26 (×2): 20 mg via ORAL
  Filled 2019-12-25 (×2): qty 1

## 2019-12-25 MED ORDER — ROSUVASTATIN CALCIUM 5 MG PO TABS
10.0000 mg | ORAL_TABLET | Freq: Every day | ORAL | Status: DC
  Administered 2019-12-25 – 2020-01-02 (×9): 10 mg via ORAL
  Filled 2019-12-25 (×10): qty 2

## 2019-12-25 MED ORDER — DIPHENHYDRAMINE HCL 50 MG/ML IJ SOLN
INTRAMUSCULAR | Status: AC
  Filled 2019-12-25: qty 1

## 2019-12-25 MED ORDER — ONDANSETRON HCL 4 MG/2ML IJ SOLN
4.0000 mg | Freq: Four times a day (QID) | INTRAMUSCULAR | Status: DC | PRN
  Administered 2019-12-29 – 2020-01-01 (×6): 4 mg via INTRAVENOUS
  Filled 2019-12-25 (×6): qty 2

## 2019-12-25 MED ORDER — CAMPHOR-MENTHOL 0.5-0.5 % EX LOTN
1.0000 "application " | TOPICAL_LOTION | Freq: Three times a day (TID) | CUTANEOUS | Status: DC | PRN
  Filled 2019-12-25 (×2): qty 222

## 2019-12-25 MED ORDER — SODIUM ZIRCONIUM CYCLOSILICATE 5 G PO PACK
5.0000 g | PACK | Freq: Once | ORAL | Status: DC
  Filled 2019-12-25: qty 1

## 2019-12-25 MED ORDER — SEVELAMER CARBONATE 800 MG PO TABS
800.0000 mg | ORAL_TABLET | Freq: Three times a day (TID) | ORAL | Status: DC
  Administered 2019-12-25 – 2019-12-28 (×5): 800 mg via ORAL
  Filled 2019-12-25 (×6): qty 1

## 2019-12-25 MED ORDER — DOXERCALCIFEROL 4 MCG/2ML IV SOLN
1.0000 ug | INTRAVENOUS | Status: DC
  Administered 2020-01-01: 1 ug via INTRAVENOUS
  Filled 2019-12-25 (×4): qty 2

## 2019-12-25 MED ORDER — RENA-VITE PO TABS
1.0000 | ORAL_TABLET | Freq: Every day | ORAL | Status: DC
  Administered 2019-12-26 – 2020-01-02 (×7): 1 via ORAL
  Filled 2019-12-25 (×7): qty 1

## 2019-12-25 MED ORDER — HYDROXYZINE HCL 25 MG PO TABS: 25.0000 mg | ORAL_TABLET | Freq: Three times a day (TID) | ORAL | Status: DC | PRN

## 2019-12-25 MED ORDER — ZOLPIDEM TARTRATE 5 MG PO TABS: 5.0000 mg | ORAL_TABLET | Freq: Every evening | ORAL | Status: DC | PRN

## 2019-12-25 MED ORDER — IOHEXOL 350 MG/ML SOLN
75.0000 mL | Freq: Once | INTRAVENOUS | Status: AC | PRN
  Administered 2019-12-25: 75 mL via INTRAVENOUS

## 2019-12-25 MED ORDER — DOCUSATE SODIUM 283 MG RE ENEM
1.0000 | ENEMA | RECTAL | Status: DC | PRN
  Filled 2019-12-25: qty 1

## 2019-12-25 MED ORDER — ACETAMINOPHEN 325 MG PO TABS
650.0000 mg | ORAL_TABLET | Freq: Four times a day (QID) | ORAL | Status: DC | PRN
  Administered 2019-12-26 – 2019-12-30 (×4): 650 mg via ORAL
  Filled 2019-12-25 (×4): qty 2

## 2019-12-25 NOTE — Consult Note (Signed)
NAME:  Crystal Dunlap, MRN:  [id number], DOB:  [birth year], LOS: 0 ADMISSION DATE:  12/24/2019, CONSULTATION DATE:  12/25/2019 REFERRING MD:  Dr. 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 Augustin Coupe and currently being evaluated for third renal transplant, and HTN presenting with one day history of hemoptysis.   She reports URI symptoms since last Monday with nasal congestion, sneezing, and coughing with intermittent yellowish phlegm.  Had tmax 100 Friday.  Last iHD on 6/25, Friday.  No sick contacts.  S/p both COVID vaccinations.  Reports yesterday she started coughing up bright red blood after a coughing spell.  At first it was mixed, but then became straight blood.  Reports coughing up "2-3 sytrofoam cups full".  The amount concerned her.  She has never had this before.  Denies any previous GI bleeding.  Denies any vomiting or aspiration episodes.  Reports her stools have been darker but no frank blood in stool.  Denies any chest pain, shortness of breath, syncope, swelling of extremities, or abdominal pain.  Her last episode was this morning around 2 am.    Workup notable for  Hgb 12.5, INR 1.9, PT 21.3, WBC 2.6 (stable, prior 2.3 in 06/2018), K 6.4, CO2 20, BUN 61, sCr 13.39, AG 19, lipase 63, neg SARS 2, CXR shows small chronic right pleural effusion without focal infiltrate. CTA PE performed which was non diagnostic for PE given chronic occlusion of the SVC stent with numerous mediastinal and retroperitoneal venous collaterals, right middle and lower lobe ground-glass airspace  disease which may reflect hemorrhage or aspiration, 6 mm RUL ground glass nodule, and numerous varices surrounding the distal esophagus.  Vascular surgery was consulted who advised medical treatment only at this time.  She is afebrile and has been hemodynamically stable and on room air.  She continues to deny any chest discomfort or shortness of breath.   She was admitted to [facility] and pulmonary consulted for further recommendations.    Past Medical History  Never smoker, Prior PE (2015) and DVT on Coumadin, ESRD 2/2 FSGS (age 15) on MWF iHD via left [place] vas cath, s/p 2 failed renal transplants, HTN  Significant Hospital Events   6/28 Admitted [place]  Consults:  PCCM  Procedures:   Significant Diagnostic Tests:   Echocardiogram May 2021 The left ventricle is normal in size. Ejection Fraction = 60-65%. The left ventricular wall motion is normal. Mild aortic regurgitation. There is mild mitral regurgitation. There is moderate tricuspid regurgitation. PA pressure estimate is normal. Normal RA and RV with normal right ventricular systolic function  7/02 CTA PE >> 1. Occlusion of the SVC stent. As result, the contrast power injected into the left upper extremity refluxes into numerous mediastinal and retroperitoneal venous collaterals. Opacification of the pulmonary vasculature is nondiagnostic for the detection of pulmonary emboli. 2. Right middle and right lower lobe ground-glass airspace disease which may reflect hemorrhage or aspiration. 3. 6 mm right upper lobe ground-glass nodule. Initial follow-up with CT at 6-12 months  is recommended to confirm persistence. 4. Numerous varices surround the distal esophagus, and may account for the patient's hematemesis. ADDENDUM: The stent within the superior vena cava was in fact present on the previous CT, but was more difficult to appreciate due to contrast within the patent lumen. The occlusion stent near the atrial caval junction has occurred in the  interim since 2019.  Micro Data:  6/27 SARS 2 >> neg 6/28 BCx 2 >> 6/28 MRSA PCR >> neg  Antimicrobials:  n/a  Interim history/subjective:   Objective   Blood pressure 104/82, pulse 71, temperature 98.5 F (36.9 C), temperature source Oral, resp. rate 15, height 5\' 2"  (1.575 m), weight 47.9 kg, SpO2 99 %.       No intake or output data in the 24 hours ending 12/25/19 0918 Filed Weights   12/24/19 2030 12/25/19 0639  Weight: 48.4 kg 47.9 kg   Examination: General:  Thin adult female lying in bed in NAD HEENT: MM pink/moist, nasopharynx and posterior oropharynx clear, no evidence of bleeding Neuro: Alert, oriented x 4, MAE, non focal CV: rr, no murmur, left subclavian dialysis catheter site unremarkable PULM:  Non labored, normal rate, speaking full sentences, lungs clear throughout GI: soft, bs+ active, NT Extremities: warm/dry, no edema  Skin: no rashes   Resolved Hospital Problem list    Assessment & Plan:   Possible Hemoptysis vs hematemesis Hx of PE and LUE DVT on coumadin P: - continue close monitoring in PCU, remains on room air, asymptomatic - last episode around 2am - unlikely for patient to have that amount of hemoptysis, be intermittent since, and remain asymptomatic.  Does have some GGO in RML and RLL which could reflect some aspiration vs hemorrhage.  CTA non diagnostic for PE but less likely based on symptoms and assessment; had TTE in May which showed normal RV and RV systolic function.  Discussed with attending MD and plan to monitor for now.  May warrant bronchoscopy at some point but at this time will monitor closely and take less invasive approach.  Question GI source given evidence of esophageal varices and recommend further GI evaluation.  - will trend H/H, previous 12.5 - send stool for hemo-occult  - ok to bridge from coumadin to heparin gtt per pharmacy, no bolus and monitor closely   RUL nodule P: - follow up with outpatient, non contrast CT in  8-12 weeks    Remainder per primary team.  PCCM will follow along.   Best practice:  Diet: renal Pain/Anxiety/Delirium protocol (if indicated): n/a VAP protocol (if indicated): n/a DVT prophylaxis: heparin gtt GI prophylaxis: n/a Glucose control: n/a Mobility: as tolerated Code Status: full  Family Communication: patient updated on plan of care Disposition: PCU  Labs   CBC: Recent Labs  Lab 12/24/19 2107  WBC 2.6*  NEUTROABS 1.4*  HGB 12.5  HCT 41.5  MCV 97.0  PLT 409    Basic Metabolic Panel: Recent Labs  Lab 12/24/19 2335  NA 137  K 6.4*  CL 98  CO2 20*  GLUCOSE 94  BUN 61*  CREATININE 13.39*  CALCIUM 7.3*   GFR: Estimated Creatinine Clearance: 4.6 mL/min (A) (by C-G formula based on SCr of 13.39 mg/dL (H)). Recent Labs  Lab 12/24/19 2107  WBC 2.6*    Liver Function Tests: Recent Labs  Lab 12/24/19 2335  AST 26  ALT 15  ALKPHOS 113  BILITOT 0.6  PROT 8.7*  ALBUMIN 4.6   Recent Labs  Lab 12/24/19 2107  LIPASE 63*   No results for input(s): AMMONIA in the last 168 hours.  ABG    Component Value Date/Time   PHART 7.312 (L) 08/09/2015 1356   PCO2ART 41.8 08/09/2015 1356   PO2ART 284.0 (H) 08/09/2015 1356   HCO3 21.2 08/09/2015 1356   TCO2 26 06/23/2018 1112   ACIDBASEDEF 5.0 (H) 08/09/2015 1356   O2SAT 100.0 08/09/2015 1356     Coagulation Profile: Recent Labs  Lab 12/24/19 2107  INR 1.9*    Cardiac Enzymes: No results for input(s): CKTOTAL, CKMB, CKMBINDEX, TROPONINI in the last 168 hours.  HbA1C: No results found for: HGBA1C  CBG: No results for input(s): GLUCAP in the last 168 hours.  Review of Systems:   Review of Systems  Constitutional: Negative for chills, malaise/fatigue and weight loss.  HENT: Positive for congestion.   Respiratory: Positive for cough, hemoptysis and sputum production. Negative for shortness of breath and wheezing.   Cardiovascular: Negative for chest pain, orthopnea and leg swelling.    Gastrointestinal: Negative for abdominal pain, blood in stool, constipation, diarrhea, melena, nausea and vomiting.  Neurological: Negative for focal weakness and loss of consciousness.   Past Medical History  She,  has a past medical history of Anemia, ESRD (end stage renal disease) on dialysis ([place]) (08/10/2008), Focal segmental glomerulosclerosis, GERD (gastroesophageal reflux disease), Hyperlipidemia, Hypertension, Pulmonary emboli ([place]), Secondary hyperparathyroidism ([place]), and SIRS (systemic inflammatory response syndrome) ([place]) (11/23/2016).   Surgical History    Past Surgical History:  Procedure Laterality Date  . COLON SURGERY  ~ 2011/2012   "for bowel obstruction"  . INSERTION OF DIALYSIS CATHETER Left ~ 2016   chest  . KIDNEY TRANSPLANT  2007     Social History   reports that she has never smoked. She has never used smokeless tobacco. She reports that she does not drink alcohol and does not use drugs.   Family History   Her family history is negative for Kidney disease.   Allergies Allergies  Allergen Reactions  . Cefazolin Anaphylaxis  . Ferrlecit [Na Ferric Gluc Cplx In Sucrose] Anaphylaxis    Anaphylactic shock/severe angioedema on 08/09/15 related to IV iron Ferrlecit IV)  required intubation for respiratory failure  . Cyclosporine Other (See Comments)    Loss of motor skills  . Lactose Intolerance (Gi) Nausea Only    Stomach pains also  . Latex Rash  . Vancomycin Rash     Home Medications  Prior to Admission medications   Medication Sig Start Date End Date Taking? Authorizing Provider  benzonatate (TESSALON) 100 MG capsule Take 1 capsule (100 mg total) by mouth every 8 (eight) hours. 12/24/19   Suzy Bouchard, PA-C  Cholecalciferol (VITAMIN D3) 2000 units capsule Take 2,000 Units by mouth daily. 05/08/16   [provider]  diphenhydrAMINE (BENADRYL) 25 mg capsule Take 1 capsule (25 mg total) by mouth every 6 (six) hours as needed for allergies.  08/14/15   Ghimire, Henreitta Leber, MD  docusate sodium (COLACE) 100 MG capsule Take 200 mg by mouth daily.    [provider]  EPINEPHrine 0.3 mg/0.3 mL IJ SOAJ injection Inject 0.3 mLs (0.3 mg total) into the muscle once. Patient taking differently: Inject 0.3 mg into the muscle once as needed (FOR ANAPHYLAXIS).  08/16/15   Vira Blanco, MD  famotidine (PEPCID) 20 MG tablet Take 20 mg by mouth 2 (two) times daily.    [provider]  folic acid (FOLVITE) 1 MG tablet Take 1 mg by mouth daily.  [provider]  losartan (COZAAR) 100 MG tablet Take 100 mg by mouth daily.    [provider]  multivitamin (RENA-VIT) TABS tablet Take 1 tablet by mouth daily. 02/12/15   [provider]  rosuvastatin (CRESTOR) 10 MG tablet Take 10 mg by mouth daily.    [provider]  sevelamer (RENAGEL) 800 MG tablet Take 800 mg by mouth 3 (three) times daily with meals.    [provider]  warfarin (COUMADIN) 1 MG tablet Take 3.5 mg by mouth at bedtime. IN CONJUNCTION WITH ONE 5 MG TABLET TO EQUAL A TOTAL DOSAGE OF 8.5 MILLIGRAMS    [provider]  warfarin (COUMADIN) 5 MG tablet Take 5 mg by mouth at bedtime. IN CONJUNCTION WITH THREE AND ONE-HALF 1 MG TABLETS TO EQUAL A TOTAL DOSAGE OF 8.5 MILLIGRAMS    [provider]       Kennieth Rad, MSN, AGACNP-BC [facility] 12/25/2019, 10:43 AM  See Shea Evans for personal pager PCCM on call pager [phone]

## 2019-12-25 NOTE — H&P (Signed)
History and Physical    Crystal Dunlap UEA:[id number] DOB: June 14, 1991 DOA: 12/24/2019  PCP: Henderson Baltimore, FNP Consultants:  Augustin Coupe - nephrology Patient coming from:  Home - lives with mother and grandmother; NOK:  Mother, [phone]   Chief Complaint: hemoptysis  HPI: Crystal Dunlap is a 29 y.o. 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  She reports coughing up a lot of blood starting yesterday.  Bright red blood.  She had a cold and had been coughing and thinks this is what led to it.  +fever - Friday to 100, 99 today.  No GI symptoms.  No sick contacts.  She was feeling well other than a mild cold until yesterday.  Last episode was maybe 2AM.  She in on Coumadin, and "it's been up and down."  INR as high as 2.1, as low as 1.5?  She is trying to get back on a tramsplant list.  Last HD was Friday.  She does feel like she needs HD soon.    ED Course:  [facility] to [facility] transfer, per Dr. Hal Hope:  29 year old years already patient on dialysis with history of DVT on Coumadin presents with hemoptysis. INR was subtherapeutic at 1.9. ER physician did a CT angiogram to make sure there is no PE did show obstruction of her SVC stent. As per Dr. Doren Custard on-call vascular surgeon advised no intervention at this time. Patient admitted for hemoptysis which could be from the engorged collaterals of the pulmonary vessels. Cause not clear for hemoptysis. Patient admitted for further management. Patient will be given Lokelma for the mild hyperkalemia. Patient also will need dialysis on arrival.   Review of Systems: As per HPI; otherwise review of systems reviewed and negative.   Ambulatory Status:  Ambulates without assistance  COVID Vaccine Status:  Complete  Past Medical History:  Diagnosis Date  . Anemia   . Dyslipidemia 12/25/2019  . ESRD (end stage renal disease) on  dialysis ([place]) 08/10/2008   MWF; [facility]; [place]. 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 on HD since 2010. TDC dependent for several yrs.    . Focal segmental glomerulosclerosis   . GERD (gastroesophageal reflux disease)   . Hyperlipidemia   . Hypertension   . 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 Secondary hyperparathyroidism ([place])   . SIRS (systemic inflammatory response syndrome) ([place]) 11/23/2016    Past Surgical History:  Procedure Laterality Date  . COLON SURGERY  ~ 2011/2012   "for bowel obstruction"  . INSERTION OF DIALYSIS CATHETER Left ~ 2016   chest  . KIDNEY TRANSPLANT  2007    Social History   Socioeconomic History  . Marital status: Single    Spouse name: Not on file  . Number of children: Not on file  . Years of education: Not on file  . Highest education level: Not on file  Occupational History  . Occupation: Control and instrumentation engineer  Tobacco Use  . Smoking status: Never Smoker  . Smokeless tobacco: Never Used  Vaping Use  . Vaping Use: Never used  Substance and Sexual Activity  . Alcohol use: No  . Drug use: No  . Sexual activity: Not Currently  Other Topics Concern  . Not on file  Social History  Narrative  . Not on file   Social Determinants of Health   Financial Resource Strain:   . Difficulty of Paying Living Expenses:   Food Insecurity:   . Worried About Charity fundraiser in the Last Year:   . Arboriculturist in the Last Year:   Transportation Needs:   . Film/video editor (Medical):   Marland Kitchen Lack of Transportation (Non-Medical):   Physical Activity:   . Days of Exercise per Week:   . Minutes of Exercise per Session:   Stress:   . Feeling of Stress :   Social Connections:   . Frequency of Communication with Friends and Family:   . Frequency of Social Gatherings with Friends and Family:   . Attends Religious Services:   . Active  Member of Clubs or Organizations:   . Attends Archivist Meetings:   Marland Kitchen Marital Status:   Intimate Partner Violence:   . Fear of Current or Ex-Partner:   . Emotionally Abused:   Marland Kitchen Physically Abused:   . Sexually Abused:     Allergies  Allergen Reactions  . Cefazolin Anaphylaxis  . Ferrlecit [Na Ferric Gluc Cplx In Sucrose] Anaphylaxis    Anaphylactic shock/severe angioedema on 08/09/15 related to IV iron Ferrlecit IV)  required intubation for respiratory failure  . Cyclosporine Other (See Comments)    Loss of motor skills  . Lactose Intolerance (Gi) Nausea Only    Stomach pains also  . Latex Rash  . Vancomycin Rash    Family History  Problem Relation Age of Onset  . Kidney disease Neg Hx     Prior to Admission medications   Medication Sig Start Date End Date Taking? Authorizing Provider  benzonatate (TESSALON) 100 MG capsule Take 1 capsule (100 mg total) by mouth every 8 (eight) hours. 12/24/19   Suzy Bouchard, PA-C  Cholecalciferol (VITAMIN D3) 2000 units capsule Take 2,000 Units by mouth daily. 05/08/16   [provider]  diphenhydrAMINE (BENADRYL) 25 mg capsule Take 1 capsule (25 mg total) by mouth every 6 (six) hours as needed for allergies. 08/14/15   Ghimire, Henreitta Leber, MD  docusate sodium (COLACE) 100 MG capsule Take 200 mg by mouth daily.    [provider]  EPINEPHrine 0.3 mg/0.3 mL IJ SOAJ injection Inject 0.3 mLs (0.3 mg total) into the muscle once. Patient taking differently: Inject 0.3 mg into the muscle once as needed (FOR ANAPHYLAXIS).  08/16/15   Vira Blanco, MD  famotidine (PEPCID) 20 MG tablet Take 20 mg by mouth 2 (two) times daily.    [provider]  folic acid (FOLVITE) 1 MG tablet Take 1 mg by mouth daily.    [provider]  losartan (COZAAR) 100 MG tablet Take 100 mg by mouth daily.    [provider]  multivitamin (RENA-VIT) TABS tablet Take 1 tablet by mouth daily. 02/12/15   [provider]  rosuvastatin (CRESTOR) 10 MG tablet Take 10 mg by mouth daily.    [provider]  sevelamer (RENAGEL) 800 MG tablet Take 800 mg by mouth 3 (three) times daily with meals.    [provider]  warfarin (COUMADIN) 1 MG tablet Take 3.5 mg by mouth at bedtime. IN CONJUNCTION WITH ONE 5 MG TABLET TO EQUAL A TOTAL DOSAGE OF 8.5 MILLIGRAMS    [provider]  warfarin (COUMADIN) 5 MG tablet Take 5 mg by mouth at bedtime. IN CONJUNCTION WITH THREE AND ONE-HALF 1 MG TABLETS TO  EQUAL A TOTAL DOSAGE OF 8.5 MILLIGRAMS    [provider]    Physical Exam: Vitals:   12/25/19 0544 12/25/19 0550 12/25/19 0639 12/25/19 0800  BP: 98/81 100/80 104/82   Pulse: 88 78 71   Resp: 18  15   Temp:   99 F (37.2 C) 98.5 F (36.9 C)  TempSrc:   Oral Oral  SpO2: 100% 97% 99%   Weight:   47.9 kg   Height:   5\' 2"  (1.575 m)      . General:  Appears calm and comfortable and is NAD . Eyes:  EOMI, normal lids, iris . ENT:  grossly normal hearing, lips & tongue, mmm . Neck:  no LAD, masses or thyromegaly . Cardiovascular:  RRR, no m/r/g. No LE edema.  Marland Kitchen Respiratory:   CTA bilaterally with no wheezes/rales/rhonchi.  Normal respiratory effort. . Abdomen:  soft, NT, ND, NABS . Back:   normal alignment, no CVAT . Skin:  no rash or induration seen on limited exam . Musculoskeletal:  grossly normal tone BUE/BLE, good ROM, no bony abnormality . Psychiatric:  grossly normal mood and affect, speech fluent and appropriate, AOx3 . Neurologic:  CN 2-12 grossly intact, moves all extremities in coordinated fashion    Radiological Exams on Admission: CT Angio Chest PE W and/or Wo Contrast  Addendum Date: 12/25/2019   ADDENDUM REPORT: 12/25/2019 01:45 ADDENDUM: The stent within the superior vena cava was in fact present on the previous CT, but was more difficult to appreciate due to contrast within the patent lumen. The occlusion stent near the atrial caval junction has  occurred in the interim since 2019. Electronically Signed   By: Randa Ngo M.D.   On: 12/25/2019 01:45   Result Date: 12/25/2019 CLINICAL DATA:  Productive cough for 1 week, hemoptysis EXAM: CT ANGIOGRAPHY CHEST WITH CONTRAST TECHNIQUE: Multidetector CT imaging of the chest was performed using the standard protocol during bolus administration of intravenous contrast. Multiplanar CT image reconstructions and MIPs were obtained to evaluate the vascular anatomy. CONTRAST:  19mL OMNIPAQUE IOHEXOL 350 MG/ML SOLN COMPARISON:  12/24/2017, 12/24/2019 FINDINGS: Cardiovascular: Contrast was injected via the left upper extremity. Since the previous exam, a stent has been placed within the superior vena cava just proximal to the atrial caval junction. There is evidence of complete occlusion of the stent, with extensive venous collateral opacification as result of the pressure injection. Contrast refluxes into numerous collateral veins in the mediastinum and retroperitoneum. This results in a nondiagnostic evaluation of the pulmonary vasculature due to insufficient contrast enhancement. Heart is unremarkable without pericardial effusion. Thoracic aorta is normal in caliber with no aneurysm or dissection. Mediastinum/Nodes: No enlarged mediastinal, hilar, or axillary lymph nodes. Thyroid gland, trachea, and esophagus demonstrate no significant findings. Lungs/Pleura: There is patchy ground-glass airspace disease within the right lung base within the right middle and right lower lobes. This could reflect aspiration or hemorrhage given clinical presentation. There is a 6 mm ground-glass right upper lobe nodule abutting the minor fissure, reference image 38. No other airspace disease, effusion, or pneumothorax. The central airways are patent. Upper Abdomen: Extensive venous collaterals are seen within the upper abdomen. This includes large varices surrounding the distal esophagus, which may account for the patient's  hematemesis. Musculoskeletal: There are no acute or destructive bony lesions. Chronic renal osteodystrophy again noted. Reconstructed images demonstrates no additional findings. Review of the MIP images confirms the above findings. IMPRESSION: 1. Occlusion of the SVC stent. As result, the contrast power injected  into the left upper extremity refluxes into numerous mediastinal and retroperitoneal venous collaterals. Opacification of the pulmonary vasculature is nondiagnostic for the detection of pulmonary emboli. 2. Right middle and right lower lobe ground-glass airspace disease which may reflect hemorrhage or aspiration. 3. 6 mm right upper lobe ground-glass nodule. Initial follow-up with CT at 6-12 months is recommended to confirm persistence. If persistent, repeat CT is recommended every 2 years until 5 years of stability has been established. This recommendation follows the consensus statement: Guidelines for Management of Incidental Pulmonary Nodules Detected on CT Images: From the Fleischner Society 2017; Radiology 2017; 284:228-243. 4. Numerous varices surround the distal esophagus, and may account for the patient's hematemesis. Electronically Signed: By: Randa Ngo M.D. On: 12/25/2019 00:31   DG Chest Portable 1 View  Result Date: 12/24/2019 CLINICAL DATA:  Cough. EXAM: PORTABLE CHEST 1 VIEW COMPARISON:  July 04, 2018 FINDINGS: There is a well-positioned left-sided tunneled dialysis catheter. A left brachiocephalic vein stent is noted. The heart size is stable. There is a small right-sided pleural effusion which is stable from prior study. There is no pneumothorax. No focal infiltrate. IMPRESSION: 1. Stable small right-sided pleural effusion. 2. Well-positioned left-sided tunneled dialysis catheter. Electronically Signed   By: Constance Holster M.D.   On: 12/24/2019 21:53    EKG: Independently reviewed.  NSR with rate 73; peaked T waves with no evidence of acute ischemia   Labs on Admission:  I have personally reviewed the available labs and imaging studies at the time of the admission.  Pertinent labs:   K+ 6.4 BUN 61/Creatinine 13.39/GFR 4 Anion gap 19 WBC 2.6 Hgb 12.5; 10.3 on 07/05/18 INR 1.9 Pregnancy negative COVID negative   Assessment/Plan Principal Problem:   Hemoptysis Active Problems:   End stage renal disease on dialysis [facility])   Essential hypertension   History of pulmonary embolism   Occlusion of stent of peripheral artery   Dyslipidemia    Hemoptysis -patient on Coumadin due to h/o recurrent PE who had a recent URI and now with c/o hemoptysis -Her last episode was about 0200 AM -She does have significant pulmonary and esophageal collaterals with esophageal varices - but the patient appears certain that this is hemoptysis rather than hematemesis -Also with RML/RLL GGO which may indicate hemorrhage -Also with incidental RUL nodule which will need to be followed as an outpatient -For now, will admit to telemetry -Will transition from Coumadin to heparin for now and monitor for ongoing bleeding -Pulm consult requested  ESRD on HD, hyperkalemia -Patient on chronic MWF HD -Nephrology prn order set utilized -She does not appear to be volume overloaded or otherwise in need of acute HD; however, she did have hyperkalemia that required treatment with [facility] and so HD should be expedited if possible -Dr. Jonnie Finner was notified by pager that patient will need HD -Continue Renagel  SVC stent occlusion -D/w Dr. Scot Dock by [place] to be subacute vs. chronic in nature -No surgical intervention appears to be indicated at this time -Will elevated head of bed  HTN -Continue Cozaar  HLD -Continue Lipitor  H/o PE -Unclear history, but initially seen by [facility]. Victory Dakin at [facility] in 2017 and noted to have had 2 episodes of thrombosis, indicating that she should stay on [facility] indefinitely -As noted above, will hold Coumadin for now and transition to heparin per  pharmacy (no bolus)    Note: This patient has been tested and is negative for the novel coronavirus COVID-19.  DVT prophylaxis: Heparin Code Status:  Full - confirmed with patient Family Communication: None present Disposition Plan:  The patient is from: home  Anticipated d/c is to: home without [facility] services   Anticipated d/c date will depend on clinical response to treatment, likely 2-3 days  Patient is currently: acutely ill Consults called: Pulmonology; Vascular surgery (telephone only); Nephrology  Admission status:  Admit - It is my clinical opinion that admission to INPATIENT is reasonable and necessary because of the expectation that this patient will require hospital care that crosses at least 2 midnights to treat this condition based on the medical complexity of the problems presented.  Given the aforementioned information, the predictability of an adverse outcome is felt to be significant.    Karmen Bongo MD [facility]   How to contact the [facility] Attending or Consulting provider [place] or covering provider during after hours [place], for this patient?  1. Check the care team in [facility] and look for a) attending/consulting [facility] provider listed and b) the [facility] team listed 2. Log into [email or web address] and use [place]'s universal password to access. If you do not have the password, please contact the hospital operator. 3. Locate the [facility] provider you are looking for under [facility] and page to a number that you can be directly reached. 4. If you still have difficulty reaching the provider, please page the [facility] (Director on Call) for the Hospitalists listed on amion for assistance.   12/25/2019, 10:25 AM

## 2019-12-25 NOTE — Progress Notes (Signed)
RN went into the room to assess patient and start Heparin. Pt coughing with hemoptysis approximately 25 mL of bright red blood. RN notified MD. Heparin stopped. RN will continue to monitor.

## 2019-12-25 NOTE — Progress Notes (Signed)
ANTICOAGULATION CONSULT NOTE - Initial Consult  Pharmacy Consult for Heparin Indication: pulmonary embolus  Allergies  Allergen Reactions  . Cefazolin Anaphylaxis  . Ferrlecit [Na Ferric Gluc Cplx In Sucrose] Anaphylaxis    Anaphylactic shock/severe angioedema on 08/09/15 related to IV iron Ferrlecit IV)  required intubation for respiratory failure  . Cyclosporine Other (See Comments)    Loss of motor skills  . Lactose Intolerance (Gi) Nausea Only    Stomach pains also  . Latex Rash  . Vancomycin Rash    Patient Measurements: Height: 5\' 2"  (157.5 cm) Weight: 47.9 kg (105 lb 9.6 oz) IBW/kg (Calculated) : 50.1 Heparin Dosing Weight: 47.9 kg  Vital Signs: Temp: 98.5 F (36.9 C) (06/28 0800) Temp Source: Oral (06/28 0800) BP: 104/82 (06/28 0639) Pulse Rate: 71 (06/28 0639)  Labs: Recent Labs    12/24/19 2107 12/24/19 2335  HGB 12.5  --   HCT 41.5  --   PLT 190  --   LABPROT 21.3*  --   INR 1.9*  --   CREATININE  --  13.39*    Estimated Creatinine Clearance: 4.6 mL/min (A) (by C-G formula based on SCr of 13.39 mg/dL (H)).   Medical History: Past Medical History:  Diagnosis Date  . Anemia   . ESRD (end stage renal disease) on dialysis ([place]) 08/10/2008   MWF; [facility]; [place]. 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 on HD since 2010. TDC dependent for several yrs.    . Focal segmental glomerulosclerosis   . GERD (gastroesophageal reflux disease)   . Hyperlipidemia   . Hypertension   . 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 Secondary hyperparathyroidism ([place])   . SIRS (systemic inflammatory response syndrome) ([place]) 11/23/2016    Medications:  Medications Prior to Admission  Medication Sig Dispense Refill Last Dose  . Cholecalciferol (VITAMIN D3) 2000 units capsule Take 2,000 Units by mouth daily.     . diphenhydrAMINE (BENADRYL) 25 mg capsule Take 1  capsule (25 mg total) by mouth every 6 (six) hours as needed for allergies. 15 capsule 0   . docusate sodium (COLACE) 100 MG capsule Take 200 mg by mouth daily.     Marland Kitchen EPINEPHrine 0.3 mg/0.3 mL IJ SOAJ injection Inject 0.3 mLs (0.3 mg total) into the muscle once. (Patient taking differently: Inject 0.3 mg into the muscle once as needed (FOR ANAPHYLAXIS). ) 1 Device 1   . famotidine (PEPCID) 20 MG tablet Take 20 mg by mouth 2 (two) times daily.     . folic acid (FOLVITE) 1 MG tablet Take 1 mg by mouth daily.     Marland Kitchen losartan (COZAAR) 100 MG tablet Take 100 mg by mouth daily.     . multivitamin (RENA-VIT) TABS tablet Take 1 tablet by mouth daily.  11   . rosuvastatin (CRESTOR) 10 MG tablet Take 10 mg by mouth daily.     . sevelamer (RENAGEL) 800 MG tablet Take 800 mg by mouth 3 (three) times daily with meals.     . warfarin (COUMADIN) 1 MG tablet Take 3.5 mg by mouth at bedtime. IN CONJUNCTION WITH ONE 5 MG TABLET TO EQUAL A TOTAL DOSAGE OF 8.5 MILLIGRAMS     . warfarin (COUMADIN) 5 MG tablet Take 5 mg by mouth at bedtime. IN CONJUNCTION WITH THREE AND ONE-HALF 1 MG TABLETS TO EQUAL A TOTAL DOSAGE OF 8.5  MILLIGRAMS       Assessment: 29 year old female on warfarin prior to admission for history of PE in 2015 and DVT. INR on admission is 1.9 - slightly subtherapeutic. Pharmacy has been consulted to transition to IV Heparin therapy.   Patient noted to have a 1 day history of hemoptysis - Dr. Lorin Mercy aware and wants to transition to IV Heparin without a bolus. CT angiogram - chronic obstruction of SVC stent, non-diagnostic for PE; concern of hemorrhage and distal esophageal varies noted. CCM consulted- also confirmed with Dr. Rollene Fare that ok for heparin without bolus. Hgb is normal at 12.5. Platelets 190. Patient has ESRD on HD MWF since age 56 due to FSGS and s/p 2 failed kidney transplants.    Goal of Therapy:  Heparin level 0.3-0.7 units/ml Monitor platelets by anticoagulation protocol: Yes   Plan:   No Heparin bolus.  IV Heparin at 700 units/hr. Heparin level in 6 hours - will continue to monitor every 6 until therapeutic.  Daily Heparin level and CBC while on therapy.  Monitor for any signs of increased hemoptysis or other overt bleeding.   Sloan Leiter, PharmD, BCPS, BCCCP Clinical Pharmacist Please refer to [facility] for [place] numbers 12/25/2019,9:44 AM

## 2019-12-25 NOTE — ED Provider Notes (Addendum)
  Patient signed out pending CT scan and BMP.  CT scan is nondiagnostic for PE.  Unfortunately she has a fully occluded SVC stent with significant collateralization.  CT does show some groundglass opacities possibly favoring infection versus hemorrhage.  Given hemoptysis, hemorrhage is certainly on the differential especially with the collateral blood flow and engorged vessels related to occluded SVC.  Patient does report a prodromal viral illness as well.  Blood cultures obtained.  Will discuss with hospitalist regarding placing on antibiotics.  Blood pressure is noted to downtrend somewhat.  Patient reports she normally has a fairly low blood pressure and tolerates a low blood pressure.  She does not have any dizziness or systemic complaints.  CMP reviewed.  Potassium 6.4.  She is due for dialysis later this morning.  No EKG changes.  Overall, patient is fairly certain that she is having hemoptysis versus hematemesis.  She has engorged collateral blood vessels both in the pulmonary vasculature and gastric vasculature.  This could be contributing to her symptoms.  SVC is now totally occluded.  Mother and daughter report that this is old stent.  Unclear whether intervention would be helpful and/or possible.  Patient is not having any episodes of massive hemoptysis at this time.  I feel she warrants admission and possible further evaluation if she continues to have these episodes.  May need to hold Coumadin.  Will discuss with admitting hospitalist.  3:29 AM Spoke with hospitalist.  Requesting [facility] for hyperkalemia.  This was ordered.  Patient also be put on list for dialysis.  He also is requesting vascular surgery consultation regarding occluded SVC stent.  I spoke with Dr. Scot Dock.  Recommends elevation of head of bed.  Medically managed.  No surgical intervention indicated as this is likely subacute versus chronic.   CRITICAL CARE Performed by: Merryl Hacker   Total critical care time: 45  minutes  Critical care time was exclusive of separately billable procedures and treating other patients.  Critical care was necessary to treat or prevent imminent or life-threatening deterioration.  Critical care was time spent personally by me on the following activities: development of treatment plan with patient and/or surrogate as well as nursing, discussions with consultants, evaluation of patient's response to treatment, examination of patient, obtaining history from patient or surrogate, ordering and performing treatments and interventions, ordering and review of laboratory studies, ordering and review of radiographic studies, pulse oximetry and re-evaluation of patient's condition.   Physical Exam  BP 97/74   Pulse 66   Temp 98.9 F (37.2 C)   Resp 19   Wt 48.4 kg   LMP  (LMP Unknown)   SpO2 95%   BMI 19.50 kg/m   Physical Exam  ED Course/Procedures   Clinical Course as of Dec 24 232  Sun Dec 24, 2019  2133 INR(!): 1.9 [CA]  2134 WBC(!): 2.6 [CA]  2236 Lipase(!): 63 [CA]    Clinical Course User Index [CA] Suzy Bouchard, PA-C    Procedures  MDM   Problem List Items Addressed This Visit    None    Visit Diagnoses    Cough    -  Primary   Hemoptysis       Hyperkalemia       SVC obstruction               Keymani Glynn, Barbette Hair, MD 12/25/19 0234    Merryl Hacker, MD 12/25/19 [phone]

## 2019-12-25 NOTE — Consult Note (Signed)
[facility] Renal Consultation Note    Indication for Consultation:  Management of ESRD/hemodialysis; anemia, hypertension/volume and secondary hyperparathyroidism  HPI: Crystal Dunlap is a 29 y.o. 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 History access failures,now appears TDC dependent.  Dialyzes MWF at [facility]. Last dialyzed Friday 6/25. Compliant with dialysis Rx.   She is admitted with hemoptysis starting yesterday. No infiltrates on CXR. CTA was non diagnostic for PE, also showed ground glass airspace disease in the right lung, a  60mm ground glass RUL nodule, and notable for chronic occulusion of SVC stent. Covid swab negative. Labs notable for hyperkalemia with K+ 6.4,  Ca 7.3, Alb 4.6, WBC 2.6, Hgb 12.5.INR 1.9 Blood cultures collected.   Seen and examined at bedside. Reports coughing up some blood-tinged phlegm yesterday. Afebrile overnight. Sats 96% on RA. BPs soft on monitor. No specific complaints this am. Denies f,c, cp, sob, n/v,.   Past Medical History:  Diagnosis Date  . Anemia   . Dyslipidemia 12/25/2019  . ESRD (end stage renal disease) on dialysis ([place]) 08/10/2008   MWF; [facility]; [place]. Started dialysis age 69 (FSGS), has had 2 failed transplants first in 1994 lasted 6 mos, the 2nd in 2007 lasted 3 yrs removed 2010. on HD since 2010. TDC dependent for several yrs.    . Focal segmental glomerulosclerosis   . GERD (gastroesophageal reflux disease)   . Hyperlipidemia   . Hypertension   . 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 Secondary hyperparathyroidism ([place])   . SIRS (systemic inflammatory response syndrome) ([place]) 11/23/2016   Past Surgical History:  Procedure Laterality Date  . COLON SURGERY  ~ 2011/2012   "for bowel obstruction"  . INSERTION OF DIALYSIS CATHETER Left ~ 2016   chest  .  KIDNEY TRANSPLANT  2007   Family History  Problem Relation Age of Onset  . Kidney disease Neg Hx    Social History:  reports that she has never smoked. She has never used smokeless tobacco. She reports that she does not drink alcohol and does not use drugs. Allergies  Allergen Reactions  . Cefazolin Anaphylaxis  . Ferrlecit [Na Ferric Gluc Cplx In Sucrose] Anaphylaxis    Anaphylactic shock/severe angioedema on 08/09/15 related to IV iron Ferrlecit IV)  required intubation for respiratory failure  . Cyclosporine Other (See Comments)    Loss of motor skills  . Lactose Intolerance (Gi) Nausea Only    Stomach pains also  . Latex Rash  . Vancomycin Rash   Prior to Admission medications   Medication Sig Start Date End Date Taking? Authorizing Provider  benzonatate (TESSALON) 100 MG capsule Take 1 capsule (100 mg total) by mouth every 8 (eight) hours. 12/24/19   Suzy Bouchard, PA-C  Cholecalciferol (VITAMIN D3) 2000 units capsule Take 2,000 Units by mouth daily. 05/08/16   [provider]  diphenhydrAMINE (BENADRYL) 25 mg capsule Take 1 capsule (25 mg total) by mouth every 6 (six) hours as needed for allergies. 08/14/15   Ghimire, Henreitta Leber, MD  docusate sodium (COLACE) 100 MG capsule Take 200 mg by mouth daily.    [provider]  EPINEPHrine 0.3 mg/0.3 mL IJ SOAJ injection Inject 0.3 mLs (0.3 mg total) into the muscle once. Patient taking differently: Inject 0.3 mg into the muscle once as needed (FOR ANAPHYLAXIS).  08/16/15   Irick,  Legrand Como, MD  famotidine (PEPCID) 20 MG tablet Take 20 mg by mouth 2 (two) times daily.    [provider]  folic acid (FOLVITE) 1 MG tablet Take 1 mg by mouth daily.    [provider]  losartan (COZAAR) 100 MG tablet Take 100 mg by mouth daily.    [provider]  multivitamin (RENA-VIT) TABS tablet Take 1 tablet by mouth daily. 02/12/15   [provider]  rosuvastatin (CRESTOR) 10 MG tablet Take 10 mg by  mouth daily.    [provider]  sevelamer (RENAGEL) 800 MG tablet Take 800 mg by mouth 3 (three) times daily with meals.    [provider]  warfarin (COUMADIN) 1 MG tablet Take 3.5 mg by mouth at bedtime. IN CONJUNCTION WITH ONE 5 MG TABLET TO EQUAL A TOTAL DOSAGE OF 8.5 MILLIGRAMS    [provider]  warfarin (COUMADIN) 5 MG tablet Take 5 mg by mouth at bedtime. IN CONJUNCTION WITH THREE AND ONE-HALF 1 MG TABLETS TO EQUAL A TOTAL DOSAGE OF 8.5 MILLIGRAMS    [provider]   Current Facility-Administered Medications  Medication Dose Route Frequency Provider Last Rate Last Admin  . acetaminophen (TYLENOL) tablet 650 mg  650 mg Oral Q6H PRN Karmen Bongo, MD       Or  . acetaminophen (TYLENOL) suppository 650 mg  650 mg Rectal Q6H PRN Karmen Bongo, MD      . calcium carbonate (dosed in mg elemental calcium) suspension 500 mg of elemental calcium  500 mg of elemental calcium Oral Q6H PRN Karmen Bongo, MD      . camphor-menthol [facility]) lotion 1 application  1 application Topical N8M PRN Karmen Bongo, MD       And  . hydrOXYzine (ATARAX/VISTARIL) tablet 25 mg  25 mg Oral Q8H PRN Karmen Bongo, MD      . Chlorhexidine Gluconate Cloth 2 % PADS 6 each  6 each Topical Q0600 Lynnda Child, PA-C      . docusate sodium [facility]) enema 283 mg  1 enema Rectal PRN Karmen Bongo, MD      . famotidine (PEPCID) tablet 20 mg  20 mg Oral BID Karmen Bongo, MD      . folic acid (FOLVITE) tablet 1 mg  1 mg Oral Daily Karmen Bongo, MD      . heparin ADULT infusion 100 units/mL (25000 units/250mL sodium chloride 0.45%)  700 Units/hr Intravenous Continuous Priscella Mann, RPH      . losartan (COZAAR) tablet 100 mg  100 mg Oral Daily Karmen Bongo, MD      . multivitamin (RENA-VIT) tablet 1 tablet  1 tablet Oral Daily Karmen Bongo, MD      . ondansetron [facility]) tablet 4 mg  4 mg Oral Q6H PRN Karmen Bongo, MD       Or  . ondansetron [facility])  injection 4 mg  4 mg Intravenous Q6H PRN Karmen Bongo, MD      . rosuvastatin (CRESTOR) tablet 10 mg  10 mg Oral Daily Karmen Bongo, MD      . sevelamer carbonate (RENVELA) tablet 800 mg  800 mg Oral TID WC Karmen Bongo, MD      . sodium chloride flush (NS) 0.9 % injection 3 mL  3 mL Intravenous Lillia Mountain, MD      . sorbitol 70 % solution 30 mL  30 mL Oral PRN Karmen Bongo, MD      . Vitamin D3 2,000 Units  2,000 Units Oral Daily Karmen Bongo, MD      . zolpidem [facility]) tablet 5 mg  5 mg Oral QHS PRN Karmen Bongo, MD         ROS: As per HPI otherwise negative.  Physical Exam: Vitals:   12/25/19 0544 12/25/19 0550 12/25/19 0639 12/25/19 0800  BP: 98/81 100/80 104/82   Pulse: 88 78 71   Resp: 18  15   Temp:   99 F (37.2 C) 98.5 F (36.9 C)  TempSrc:   Oral Oral  SpO2: 100% 97% 99%   Weight:   47.9 kg   Height:   5\' 2"  (1.575 m)      General:  Young female, nad  Head: NCAT sclera not icteric, some facial edema  Neck: Supple.JVD not appreciated  Lungs: CTA bilaterally without wheezes, rales, or rhonchi. Breathing is unlabored. Heart: RRR with S1 S2 Abdomen: soft  Non-tender, non-distended.  Lower extremities:without edema or ischemic changes, no open wounds  Neuro: A & O  X 3. Moves all extremities spontaneously. Psych:  Responds to questions appropriately with a normal affect. Dialysis Access: L IJ [facility]   Labs: Basic Metabolic Panel: Recent Labs  Lab 12/24/19 2335  NA 137  K 6.4*  CL 98  CO2 20*  GLUCOSE 94  BUN 61*  CREATININE 13.39*  CALCIUM 7.3*   Liver Function Tests: Recent Labs  Lab 12/24/19 2335  AST 26  ALT 15  ALKPHOS 113  BILITOT 0.6  PROT 8.7*  ALBUMIN 4.6   Recent Labs  Lab 12/24/19 2107  LIPASE 63*   No results for input(s): AMMONIA in the last 168 hours. CBC: Recent Labs  Lab 12/24/19 2107 12/25/19 1008  WBC 2.6*  --   NEUTROABS 1.4*  --   HGB 12.5 11.9*  HCT 41.5 38.9  MCV 97.0  --   PLT 190  --     Cardiac Enzymes: No results for input(s): CKTOTAL, CKMB, CKMBINDEX, TROPONINI in the last 168 hours. CBG: No results for input(s): GLUCAP in the last 168 hours. Iron Studies: No results for input(s): IRON, TIBC, TRANSFERRIN, FERRITIN in the last 72 hours. Studies/Results: CT Angio Chest PE W and/or Wo Contrast  Addendum Date: 12/25/2019   ADDENDUM REPORT: 12/25/2019 01:45 ADDENDUM: The stent within the superior vena cava was in fact present on the previous CT, but was more difficult to appreciate due to contrast within the patent lumen. The occlusion stent near the atrial caval junction has occurred in the interim since 2019. Electronically Signed   By: Randa Ngo M.D.   On: 12/25/2019 01:45   Result Date: 12/25/2019 CLINICAL DATA:  Productive cough for 1 week, hemoptysis EXAM: CT ANGIOGRAPHY CHEST WITH CONTRAST TECHNIQUE: Multidetector CT imaging of the chest was performed using the standard protocol during bolus administration of intravenous contrast. Multiplanar CT image reconstructions and MIPs were obtained to evaluate the vascular anatomy. CONTRAST:  76mL OMNIPAQUE IOHEXOL 350 MG/ML SOLN COMPARISON:  12/24/2017, 12/24/2019 FINDINGS: Cardiovascular: Contrast was injected via the left upper extremity. Since the previous exam, a stent has been placed within the superior vena cava just proximal to the atrial caval junction. There is evidence of complete occlusion of the stent, with extensive venous collateral opacification as result of the pressure injection. Contrast refluxes into numerous collateral veins in the mediastinum and retroperitoneum. This results in a nondiagnostic evaluation of the pulmonary vasculature due to insufficient contrast enhancement. Heart is unremarkable without pericardial effusion. Thoracic aorta is normal in caliber with no  aneurysm or dissection. Mediastinum/Nodes: No enlarged mediastinal, hilar, or axillary lymph nodes. Thyroid gland, trachea, and esophagus  demonstrate no significant findings. Lungs/Pleura: There is patchy ground-glass airspace disease within the right lung base within the right middle and right lower lobes. This could reflect aspiration or hemorrhage given clinical presentation. There is a 6 mm ground-glass right upper lobe nodule abutting the minor fissure, reference image 38. No other airspace disease, effusion, or pneumothorax. The central airways are patent. Upper Abdomen: Extensive venous collaterals are seen within the upper abdomen. This includes large varices surrounding the distal esophagus, which may account for the patient's hematemesis. Musculoskeletal: There are no acute or destructive bony lesions. Chronic renal osteodystrophy again noted. Reconstructed images demonstrates no additional findings. Review of the MIP images confirms the above findings. IMPRESSION: 1. Occlusion of the SVC stent. As result, the contrast power injected into the left upper extremity refluxes into numerous mediastinal and retroperitoneal venous collaterals. Opacification of the pulmonary vasculature is nondiagnostic for the detection of pulmonary emboli. 2. Right middle and right lower lobe ground-glass airspace disease which may reflect hemorrhage or aspiration. 3. 6 mm right upper lobe ground-glass nodule. Initial follow-up with CT at 6-12 months is recommended to confirm persistence. If persistent, repeat CT is recommended every 2 years until 5 years of stability has been established. This recommendation follows the consensus statement: Guidelines for Management of Incidental Pulmonary Nodules Detected on CT Images: From the Fleischner Society 2017; Radiology 2017; 284:228-243. 4. Numerous varices surround the distal esophagus, and may account for the patient's hematemesis. Electronically Signed: By: Randa Ngo M.D. On: 12/25/2019 00:31   DG Chest Portable 1 View  Result Date: 12/24/2019 CLINICAL DATA:  Cough. EXAM: PORTABLE CHEST 1 VIEW COMPARISON:   July 04, 2018 FINDINGS: There is a well-positioned left-sided tunneled dialysis catheter. A left brachiocephalic vein stent is noted. The heart size is stable. There is a small right-sided pleural effusion which is stable from prior study. There is no pneumothorax. No focal infiltrate. IMPRESSION: 1. Stable small right-sided pleural effusion. 2. Well-positioned left-sided tunneled dialysis catheter. Electronically Signed   By: Constance Holster M.D.   On: 12/24/2019 21:53    Dialysis Orders:  [facility] MWF 3.5h 350/800 EDW 47kg 2K/2.5Ca UFP 2  TDC Heparin 3000  Hectorol 1 TIW   Assessment/Plan: 1. Hemoptysis. Hx PE/DVT on Coumadin. CTA today was "non diagnostic" for PE. Ground-glass airspace disease on CT. Hemorrhage vs aspiration. Hgb stable. Per primary.  Will try to optimize volume status with HD today.  2. ESRD -  HD MWF. Hyperkalemia today - will correct with HD.  3. Hypertension/volume  - BP soft today. Would hold losartan to allow volume removal. UF 2.5-3L today.  4. Anemia  - Hgb 11.9 Not on ESA. Follow trends.  5. Metabolic bone disease -  Ca low. Continue Ca acetate binder/Tums/Hectorol.  6. Hx PE/DVT  - on Coumadin. INR 1.9 on admission. Heparin/warfarin bridge per pharmacy   Lynnda Child PA-C [facility] Pager [phone] 12/25/2019, 11:31 AM

## 2019-12-25 NOTE — Progress Notes (Signed)
I was notified by nursing staff that the patient started with recurrent hemoptysis.  The RN is certain it is associated with coughing, not emesis.  It started about 20 minutes ago and she has coughed up about 25 mL of frank blood.  The nurse did start the heparin after onset of hemoptysis and I have asked her to hold this for now.  Dr. Ander Slade has been notified.  Carlyon Shadow, M.D.

## 2019-12-25 NOTE — ED Notes (Signed)
Dr. Dina Rich aware of 6.4 potassium level.

## 2019-12-25 NOTE — Progress Notes (Signed)
Keep heparin on hold  Tranexamic acid nebulization  Bronchoscopy scheduled for 730 12/26/2019

## 2019-12-25 NOTE — H&P (View-Only) (Signed)
Keep heparin on hold  Tranexamic acid nebulization  Bronchoscopy scheduled for 730 12/26/2019

## 2019-12-26 ENCOUNTER — Encounter (HOSPITAL_COMMUNITY)
Disposition: A | Discharge status: Home / Self Care | Payer: Self-pay | Source: Home / Self Care | Attending: Internal Medicine

## 2019-12-26 ENCOUNTER — Inpatient Hospital Stay (HOSPITAL_COMMUNITY): Payer: Medicare Other | Admitting: Anesthesiology

## 2019-12-26 ENCOUNTER — Encounter (HOSPITAL_COMMUNITY): Payer: Self-pay | Admitting: Internal Medicine

## 2019-12-26 DIAGNOSIS — E785 Hyperlipidemia, unspecified: Secondary | ICD-10-CM

## 2019-12-26 DIAGNOSIS — T82599A Other mechanical complication of unspecified cardiac and vascular devices and implants, initial encounter: Secondary | ICD-10-CM

## 2019-12-26 DIAGNOSIS — I950 Idiopathic hypotension: Secondary | ICD-10-CM

## 2019-12-26 DIAGNOSIS — E875 Hyperkalemia: Secondary | ICD-10-CM

## 2019-12-26 DIAGNOSIS — I871 Compression of vein: Secondary | ICD-10-CM

## 2019-12-26 HISTORY — PX: VIDEO BRONCHOSCOPY: SHX5072

## 2019-12-26 HISTORY — PX: BRONCHIAL WASHINGS: SHX5105

## 2019-12-26 LAB — BASIC METABOLIC PANEL
Anion gap: 16 — ABNORMAL HIGH (ref 5–15)
BUN: 18 mg/dL (ref 6–20)
CO2: 22 mmol/L (ref 22–32)
Calcium: 8.1 mg/dL — ABNORMAL LOW (ref 8.9–10.3)
Chloride: 99 mmol/L (ref 98–111)
Creatinine, Ser: 6.76 mg/dL — ABNORMAL HIGH (ref 0.44–1.00)
GFR calc Af Amer: 9 mL/min — ABNORMAL LOW (ref >60)
GFR calc non Af Amer: 7 mL/min — ABNORMAL LOW (ref >60)
Glucose, Bld: 59 mg/dL — ABNORMAL LOW (ref 70–99)
Potassium: 3.9 mmol/L (ref 3.5–5.1)
Sodium: 137 mmol/L (ref 135–145)

## 2019-12-26 LAB — GLUCOSE, CAPILLARY
Glucose-Capillary: 68 mg/dL — ABNORMAL LOW (ref 70–99)
Glucose-Capillary: 73 mg/dL (ref 70–99)

## 2019-12-26 LAB — CBC
HCT: 43 % (ref 36.0–46.0)
Hemoglobin: 13 g/dL (ref 12.0–15.0)
MCH: 29.7 pg (ref 26.0–34.0)
MCHC: 30.2 g/dL (ref 30.0–36.0)
MCV: 98.2 fL (ref 80.0–100.0)
Platelets: 170 10*3/uL (ref 150–400)
RBC: 4.38 MIL/uL (ref 3.87–5.11)
RDW: 16.1 % — ABNORMAL HIGH (ref 11.5–15.5)
WBC: 2.9 10*3/uL — ABNORMAL LOW (ref 4.0–10.5)
nRBC: 0 % (ref 0.0–0.2)

## 2019-12-26 LAB — CYTOLOGY - NON PAP

## 2019-12-26 LAB — HEPARIN LEVEL (UNFRACTIONATED): Heparin Unfractionated: 0.26 IU/mL — ABNORMAL LOW (ref 0.30–0.70)

## 2019-12-26 SURGERY — VIDEO BRONCHOSCOPY WITHOUT FLUORO
Anesthesia: General

## 2019-12-26 MED ORDER — PROPOFOL 10 MG/ML IV BOLUS
INTRAVENOUS | Status: DC | PRN
  Administered 2019-12-26: 20 mg via INTRAVENOUS
  Administered 2019-12-26: 50 mg via INTRAVENOUS
  Administered 2019-12-26: 30 mg via INTRAVENOUS

## 2019-12-26 MED ORDER — LIDOCAINE 2% (20 MG/ML) 5 ML SYRINGE
INTRAMUSCULAR | Status: DC | PRN
  Administered 2019-12-26: 40 mg via INTRAVENOUS
  Administered 2019-12-26: 60 mg via INTRAVENOUS

## 2019-12-26 MED ORDER — FENTANYL CITRATE (PF) 100 MCG/2ML IJ SOLN
INTRAMUSCULAR | Status: DC | PRN
  Administered 2019-12-26: 50 ug via INTRAVENOUS

## 2019-12-26 MED ORDER — HEPARIN SODIUM (PORCINE) 1000 UNIT/ML IJ SOLN
INTRAMUSCULAR | Status: AC
  Administered 2019-12-26: 2600 [IU]
  Filled 2019-12-26: qty 3

## 2019-12-26 MED ORDER — SODIUM CHLORIDE 0.9 % IV SOLN: INTRAVENOUS | Status: DC

## 2019-12-26 MED ORDER — ALBUMIN HUMAN 5 % IV SOLN
12.5000 g | Freq: Once | INTRAVENOUS | Status: AC
  Administered 2019-12-26: 12.5 g via INTRAVENOUS

## 2019-12-26 MED ORDER — ALBUMIN HUMAN 5 % IV SOLN
INTRAVENOUS | Status: AC
  Filled 2019-12-26: qty 250

## 2019-12-26 MED ORDER — PHENYLEPHRINE 40 MCG/ML (10ML) SYRINGE FOR IV PUSH (FOR BLOOD PRESSURE SUPPORT)
PREFILLED_SYRINGE | INTRAVENOUS | Status: DC | PRN
  Administered 2019-12-26 (×3): 80 ug via INTRAVENOUS

## 2019-12-26 MED ORDER — FAMOTIDINE 20 MG PO TABS
20.0000 mg | ORAL_TABLET | Freq: Every day | ORAL | Status: DC
  Administered 2019-12-27 – 2020-01-02 (×7): 20 mg via ORAL
  Filled 2019-12-26 (×7): qty 1

## 2019-12-26 MED ORDER — PREDNISONE 20 MG PO TABS
20.0000 mg | ORAL_TABLET | Freq: Every day | ORAL | Status: AC
  Administered 2019-12-27 – 2020-01-02 (×7): 20 mg via ORAL
  Filled 2019-12-26 (×7): qty 1

## 2019-12-26 MED ORDER — LIDOCAINE HCL URETHRAL/MUCOSAL 2 % EX GEL
CUTANEOUS | Status: AC
  Filled 2019-12-26: qty 11

## 2019-12-26 MED ORDER — HEPARIN (PORCINE) 25000 UT/250ML-% IV SOLN
800.0000 [IU]/h | INTRAVENOUS | Status: DC
  Administered 2019-12-26: 700 [IU]/h via INTRAVENOUS

## 2019-12-26 MED ORDER — MIDAZOLAM HCL 2 MG/2ML IJ SOLN
INTRAMUSCULAR | Status: DC | PRN
  Administered 2019-12-26 (×2): 1 mg via INTRAVENOUS

## 2019-12-26 MED ORDER — LIDOCAINE HCL URETHRAL/MUCOSAL 2 % EX GEL: 1.0000 "application " | Freq: Once | CUTANEOUS | Status: DC

## 2019-12-26 MED ORDER — PHENYLEPHRINE HCL 0.25 % NA SOLN: 1.0000 | Freq: Four times a day (QID) | NASAL | Status: DC | PRN

## 2019-12-26 MED ORDER — DEXAMETHASONE SODIUM PHOSPHATE 4 MG/ML IJ SOLN
INTRAMUSCULAR | Status: DC | PRN
  Administered 2019-12-26: 10 mg via INTRAVENOUS

## 2019-12-26 MED ORDER — CHLORHEXIDINE GLUCONATE CLOTH 2 % EX PADS
6.0000 | MEDICATED_PAD | Freq: Every day | CUTANEOUS | Status: DC
  Administered 2019-12-27 – 2019-12-31 (×2): 6 via TOPICAL

## 2019-12-26 MED ORDER — SODIUM CHLORIDE 0.9 % IV SOLN: INTRAVENOUS | Status: DC | PRN

## 2019-12-26 MED ORDER — MENTHOL 3 MG MT LOZG: 1.0000 | LOZENGE | OROMUCOSAL | Status: DC | PRN

## 2019-12-26 MED ORDER — SUCCINYLCHOLINE CHLORIDE 200 MG/10ML IV SOSY
PREFILLED_SYRINGE | INTRAVENOUS | Status: DC | PRN
  Administered 2019-12-26: 100 mg via INTRAVENOUS

## 2019-12-26 MED ORDER — ONDANSETRON HCL 4 MG/2ML IJ SOLN
INTRAMUSCULAR | Status: DC | PRN
  Administered 2019-12-26: 4 mg via INTRAVENOUS

## 2019-12-26 MED ORDER — LIDOCAINE HCL (PF) 1 % IJ SOLN
INTRAMUSCULAR | Status: AC
  Filled 2019-12-26: qty 30

## 2019-12-26 MED ORDER — LIDOCAINE HCL (PF) 1 % IJ SOLN
INTRAMUSCULAR | Status: DC | PRN
  Administered 2019-12-26: 2 mL

## 2019-12-26 MED ORDER — GLYCOPYRROLATE PF 0.2 MG/ML IJ SOSY
PREFILLED_SYRINGE | INTRAMUSCULAR | Status: DC | PRN
  Administered 2019-12-26: .1 mg via INTRAVENOUS

## 2019-12-26 MED ORDER — ALBUMIN HUMAN 25 % IV SOLN: 12.5000 g | Freq: Once | INTRAVENOUS | Status: DC

## 2019-12-26 MED ORDER — PROPOFOL 500 MG/50ML IV EMUL
INTRAVENOUS | Status: DC | PRN
  Administered 2019-12-26: 100 ug/kg/min via INTRAVENOUS

## 2019-12-26 NOTE — Op Note (Signed)
Bronchoscopy Procedure Note  Date of Operation: 12/26/2019   Pre-op Diagnosis: Hemoptysis  Post-op Diagnosis: Bronchitis  Surgeon: Sherrilyn Rist  Anesthesia: Monitored Local Anesthesia with Sedation Time Started:  Time Stopped: 08 15  Operation: Video Flexible fiberoptic bronchoscopy, diagnostic   Findings: Airway edema,  Specimen: RLL   Estimated Blood Loss: 5cc  Complications: None  Indications and History: See updated H and P same date. The risks, benefits, complications, treatment options and expected outcomes were discussed with the patient.  The possibilities of reaction to medication, pulmonary aspiration, perforation of a viscus, bleeding, failure to diagnose a condition and creating a complication requiring transfusion or operation were discussed with the patient who freely signed the consent.    Description of Procedure: The patient was re-examined in the bronchoscopy suite The patient was identified  and the procedure verified as Flexible Fiberoptic Bronchoscopy.  A Time Out was held and the above information confirmed.   After the induction of topical nasopharyngeal anesthesia, the patient was positioned  Attempting to introduce the bronchoscope, patient went into some laryngospasm and we could not advance the scope, desaturated.  Procedure was stopped at this time and decision was made to intubate the patient.  Patient was intubated uneventfully-see anesthesia note Bronchoscope was introduced through the Endo tracheal tube Fresh blood noted more in the right airway, suctioned effectively with introduction of 10 cc aliquots of saline Airway was examined Right main and right bronchus intermedius noted to be inflamed Following adequate suctioning of blood from the airway and instillation of saline, no significant bleeding was noted from any specific segment of the lung.  Bronchoscope withdrawn to the carina introduced into the left lung Some irritation noted in the  airway Left upper lobe, lingula, lower lobe bronchi were all visualized noted to be free of lesions.  Bronchoscope was withdrawn back to the carina and introduced into the right lower lobe where 60 cc of saline was instilled in 20 cc aliquots and suctioned, blood-tinged effluent will be sent for analysis. Area was reexamined and no significant bleeding noted Procedure was terminated at this time  Remain hemodynamically stable throughout the procedure  Tolerated procedure well    The Patient was taken to the Endoscopy Recovery area in satisfactory condition.  Attestation: I performed the procedure.  Sherrilyn Rist, MD

## 2019-12-26 NOTE — Progress Notes (Addendum)
ANTICOAGULATION CONSULT NOTE - Initial Consult  Pharmacy Consult for heparin Indication: pulmonary embolus  Allergies  Allergen Reactions  . Cefazolin Anaphylaxis  . Ferrlecit [Na Ferric Gluc Cplx In Sucrose] Anaphylaxis    Anaphylactic shock/severe angioedema on 08/09/15 related to IV iron Ferrlecit IV)  required intubation for respiratory failure  . Cyclosporine Other (See Comments)    Loss of motor skills  . Lactose Intolerance (Gi) Nausea Only    Stomach pains also  . Latex Rash  . Vancomycin Rash    Patient Measurements: Height: 5\' 2"  (157.5 cm) Weight: 47.6 kg (104 lb 15 oz) IBW/kg (Calculated) : 50.1 Heparin Dosing Weight: 47.6 kg   Vital Signs: Temp: 98.3 F (36.8 C) (06/29 0825) Temp Source: Oral (06/29 0825) BP: 101/68 (06/29 0945) Pulse Rate: 68 (06/29 0945)  Labs: Recent Labs    12/24/19 2107 12/24/19 2107 12/24/19 2335 12/25/19 1008 12/25/19 1613 12/26/19 0303  HGB 12.5   < >  --  11.9*  --  13.0  HCT 41.5  --   --  38.9  --  43.0  PLT 190  --   --   --   --  170  LABPROT 21.3*  --   --   --   --   --   INR 1.9*  --   --   --   --   --   HEPARINUNFRC  --   --   --   --  <0.10*  --   CREATININE  --   --  13.39*  --   --  6.76*   < > = values in this interval not displayed.    Estimated Creatinine Clearance: 9.1 mL/min (A) (by C-G formula based on SCr of 6.76 mg/dL (H)).   Medical History: Past Medical History:  Diagnosis Date  . Anemia   . Dyslipidemia 12/25/2019  . ESRD (end stage renal disease) on dialysis ([place]) 08/10/2008   MWF; [facility]; [place]. Started dialysis age 46 (FSGS), has had 2 failed transplants first in 1994 lasted 6 mos, the 2nd in 2007 lasted 3 yrs removed 2010. on HD since 2010. TDC dependent for several yrs.    . Focal segmental glomerulosclerosis   . GERD (gastroesophageal reflux disease)   . Hyperlipidemia   . Hypertension   . 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 Secondary hyperparathyroidism ([place])    . SIRS (systemic inflammatory response syndrome) ([place]) 11/23/2016    Medications:  Scheduled:  . Chlorhexidine Gluconate Cloth  6 each Topical Q0600  . cholecalciferol  2,000 Units Oral Daily  . diphenhydrAMINE      . doxercalciferol  1 mcg Intravenous Q M,W,F-HD  . famotidine  20 mg Oral BID  . folic acid  1 mg Oral Daily  . losartan  100 mg Oral Daily  . multivitamin  1 tablet Oral QHS  . [START ON 12/27/2019] predniSONE  20 mg Oral Q breakfast  . rosuvastatin  10 mg Oral Daily  . sevelamer carbonate  800 mg Oral TID WC  . sodium chloride flush  3 mL Intravenous Q12H  . tranexamic acid  500 mg Nebulization Q8H    Assessment: 29 yof on warfarin PTA for hx PE/DVT with known hx of ESRD - presenting with one day of hemoptysis. CT angiogram - chronic obstruction of SVC stent, non-diagnostic for PE; concern of hemorrhage and distal esophageal varies noted.   INR on admission 6/27 was 1.9. CBC stable. No s/sx of  bleeding. Okay per team to restart heparin infusion.   Goal of Therapy:  Heparin level 0.3-0.5 units/ml Monitor platelets by anticoagulation protocol: Yes   Plan:  No Heparin bolus.  IV Heparin at 700 units/hr. Heparin level in 6 hours - will continue to monitor every 6 until therapeutic.  Daily Heparin level and CBC while on therapy.  Monitor for any signs of increased hemoptysis or other overt bleeding.  Plan to start warfarin on 6/30 if remains stable overnight bleeding wise  Antonietta Jewel, PharmD, [place] Pharmacist  Phone: [phone] 12/26/2019 10:55 AM  Please check AMION for all [place] phone numbers After 10:00 PM, call [place] [phone]

## 2019-12-26 NOTE — Anesthesia Procedure Notes (Signed)
Procedure Name: MAC Date/Time: 12/26/2019 7:49 AM Performed by: Orlie Dakin, CRNA Pre-anesthesia Checklist: Patient identified, Emergency Drugs available, Suction available and Patient being monitored Patient Re-evaluated:Patient Re-evaluated prior to induction Oxygen Delivery Method: Nasal cannula Preoxygenation: Pre-oxygenation with 100% oxygen Induction Type: IV induction Placement Confirmation: positive ETCO2

## 2019-12-26 NOTE — Transfer of Care (Signed)
Immediate Anesthesia Transfer of Care Note  Patient: Crystal Dunlap  Procedure(s) Performed: VIDEO BRONCHOSCOPY WITHOUT FLUORO (N/A ) BRONCHIAL WASHINGS  Patient Location: Endoscopy Unit  Anesthesia Type:General  Level of Consciousness: awake and patient cooperative  Airway & Oxygen Therapy: Patient Spontanous Breathing and Patient connected to nasal cannula oxygen  Post-op Assessment: Report given to RN, Post -op Vital signs reviewed and stable and Patient moving all extremities X 4  Post vital signs: Reviewed and stable  Last Vitals:  Vitals Value Taken Time  BP    Temp    Pulse    Resp    SpO2      Last Pain:  Vitals:   12/26/19 0711  TempSrc: Oral  PainSc:          Complications: No complications documented.

## 2019-12-26 NOTE — Anesthesia Postprocedure Evaluation (Signed)
Anesthesia Post Note  Patient: Crystal Dunlap  Procedure(s) Performed: VIDEO BRONCHOSCOPY WITHOUT FLUORO (N/A ) BRONCHIAL WASHINGS     Patient location during evaluation: Endoscopy Anesthesia Type: General Level of consciousness: awake and alert Pain management: pain level controlled Vital Signs Assessment: post-procedure vital signs reviewed and stable Respiratory status: spontaneous breathing, nonlabored ventilation, respiratory function stable and patient connected to nasal cannula oxygen Cardiovascular status: blood pressure returned to baseline and stable Postop Assessment: no apparent nausea or vomiting Anesthetic complications: no   No complications documented.  Last Vitals:  Vitals:   12/26/19 0945 12/26/19 1112  BP: 101/68 92/73  Pulse: 68 65  Resp: 16 14  Temp:  37.1 C  SpO2: 97% 96%    Last Pain:  Vitals:   12/26/19 1112  TempSrc: Oral  PainSc:                  Catalina Gravel

## 2019-12-26 NOTE — Progress Notes (Signed)
NAME:  Crystal Dunlap, MRN:  [id number], DOB:  November 29, 1990, LOS: 1 ADMISSION DATE:  12/24/2019, CONSULTATION DATE: 12/25/2019 REFERRING MD: Dr. 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 Augustin Coupe and currently being evaluated for third renal transplant, and HTN presenting with one day history of hemoptysis.   She reports URI symptoms since last Monday with nasal congestion, sneezing, and coughing with intermittent yellowish phlegm.  Had tmax 100 Friday.  Last iHD on 6/25, Friday.  No sick contacts.  S/p both COVID vaccinations.  Reports yesterday she started coughing up bright red blood after a coughing spell.  At first it was mixed, but then became straight blood.  Reports coughing up "2-3 sytrofoam cups full".  The amount concerned her.  She has never had this before.  Denies any previous GI bleeding.  Denies any vomiting or aspiration episodes.  Reports her stools have been darker but no frank blood in stool.  Denies any chest pain, shortness of breath, syncope, swelling of extremities, or abdominal pain.  Her last episode was this morning around 2 am.    Workup notable for  Hgb 12.5, INR 1.9, PT 21.3, WBC 2.6 (stable, prior 2.3 in 06/2018), K 6.4, CO2 20, BUN 61, sCr 13.39, AG 19, lipase 63, neg SARS 2, CXR shows small chronic right pleural effusion without focal infiltrate. CTA PE performed which was non diagnostic for PE given chronic occlusion of the SVC stent with numerous mediastinal and retroperitoneal venous collaterals, right middle and lower lobe ground-glass airspace disease  which may reflect hemorrhage or aspiration, 6 mm RUL ground glass nodule, and numerous varices surrounding the distal esophagus.  Vascular surgery was consulted who advised medical treatment only at this time.  She is afebrile and has been hemodynamically stable and on room air.  She continues to deny any chest discomfort or shortness of breath.   She was admitted to [facility] and pulmonary consulted for further recommendations.  Past Medical History   Never smoker, Prior PE (2015) and DVT on Coumadin, ESRD 2/2 FSGS (age 29) on MWF iHD via left [place] vas cath, s/p 2 failed renal transplants, HT Significant Hospital Events   6/28 Admitted [facility]  Consults:  PCCM  Procedures:  Bronchoscopy 12/26/2019  Significant Diagnostic Tests:  Echocardiogram May 2021 The left ventricle is normal in size. Ejection Fraction = 60-65%. The left ventricular wall motion is normal. Mild aortic regurgitation. There is mild mitral regurgitation. There is moderate tricuspid regurgitation. PA pressure estimate is normal. Normal RA and RV with normal right ventricular systolic function  8/67 CTA PE >> 1. Occlusion of the SVC stent. As result, the contrast power injected into the left upper extremity refluxes into numerous mediastinal and retroperitoneal venous collaterals. Opacification of the pulmonary vasculature is nondiagnostic for the detection of pulmonary emboli. 2. Right middle and right lower lobe ground-glass airspace disease which may reflect hemorrhage or aspiration. 3. 6 mm right upper lobe ground-glass nodule. Initial follow-up with CT at 6-12 months is recommended to confirm persistence.  4. Numerous varices surround the distal esophagus, and may account for the patient's hematemesis. ADDENDUM: The stent within the superior vena cava was in fact present on the previous CT, but was more difficult to appreciate due to contrast within the patent lumen. The occlusion stent near the atrial caval junction has  occurred in the interim since 2019.  Micro Data:  6/27 SARS 2 >> neg 6/28 BCx 2 >> 6/28 MRSA PCR >> ne trimester  Antimicrobials:  none  Interim history/subjective:  Still with small amounts of hemoptysis positioning  Objective   Blood pressure (!) 85/59, pulse 70, temperature 98.3 F (36.8 C), temperature source Oral, resp. rate 15, height 5\' 2"  (1.575 m), weight 47.6 kg, SpO2 90 %.        Intake/Output Summary (Last 24 hours) at 12/26/2019 3570 Last data filed at 12/26/2019 0200 Gross per 24 hour  Intake 21.23 ml  Output 1500 ml  Net -1478.77 ml   Filed Weights   12/25/19 2156 12/26/19 0200 12/26/19 0711  Weight: 48.9 kg 47.4 kg 47.6 kg    Examination: General: Young lady does not appear to be in distress HENT: Moist oral mucosa Lungs: Rhonchi Cardiovascular: S1-S2 appreciated with no murmur Abdomen: Bowel sounds appreciated  Resolved Hospital Problem list     Assessment & Plan:  Hemoptysis -S/p bronchoscopy -Bronchoscopy significant for evidence of bronchitis -No clear-cut bleeding from any specific segment/airway of the lung noted -I will start on prednisone 20 p.o. daily -She has no leukocytosis will not initiate any antibiotics at present -Continue tranexamic acid nebulizations  Anticoagulation can be restarted -Heparin without bolus -Continue Coumadin  Right upper lobe nodule -Follow-up as outpatient, noncontrast CT in 8 to 12 weeks  Will continue to follow along with you   Sherrilyn Rist, MD [facility] Pager: [phone]

## 2019-12-26 NOTE — Anesthesia Procedure Notes (Signed)
Procedure Name: Intubation Date/Time: 12/26/2019 7:59 AM Performed by: Orlie Dakin, CRNA Pre-anesthesia Checklist: Patient identified, Emergency Drugs available, Suction available and Patient being monitored Patient Re-evaluated:Patient Re-evaluated prior to induction Oxygen Delivery Method: Circle system utilized Preoxygenation: Pre-oxygenation with 100% oxygen Induction Type: IV induction Laryngoscope Size: Miller and 3 Grade View: Grade II Tube type: Oral Tube size: 8.5 mm Number of attempts: 1 Airway Equipment and Method: Stylet Placement Confirmation: positive ETCO2,  ETT inserted through vocal cords under direct vision and breath sounds checked- equal and bilateral Tube secured with: Tape Dental Injury: Teeth and Oropharynx as per pre-operative assessment  Comments: Elective intubation due to laryngospasm during bronchoscope insertion.  Noted small polyp on anterior glottic opening, left side.  Dr Jenetta Downer aware.

## 2019-12-26 NOTE — Progress Notes (Signed)
PROGRESS NOTE    Crystal Dunlap  HKV:[id number] DOB: 06-Aug-1990 DOA: 12/24/2019 PCP: Henderson Baltimore, FNP     Brief Narrative:  Crystal Dunlap is a 29 y.o. BF PMHx PE x 2, on Coumadin; HTN; HLD; and ESRD on HD  M/W/F, HD since age 89 due to FSGS, s/p 2 failed transplants,   Presenting with hemoptysis.  She reports coughing up a lot of blood starting yesterday.  Bright red blood.  She had a cold and had been coughing and thinks this is what led to it.  +fever - Friday to 100, 99 today.  No GI symptoms.  No sick contacts.  She was feeling well other than a mild cold until yesterday.  Last episode was maybe 2AM.  She in on Coumadin, and "it's been up and down."  INR as high as 2.1, as low as 1.5?  She is trying to get back on a tramsplant list.  Last HD was Friday.  She does feel like she needs HD soon.    ED Course:  [facility] to [facility] transfer, per Dr. Hal Hope:  29 year old years already patient on dialysis with history of DVT on Coumadin presents with hemoptysis. INR was subtherapeutic at 1.9. ER physician did a CT angiogram to make sure there is no PE did show obstruction of her SVC stent. As per Dr. Doren Custard on-call vascular surgeon advised no intervention at this time. Patient admitted for hemoptysis which could be from the engorged collaterals of the pulmonary vessels. Cause not clear for hemoptysis. Patient admitted for further management. Patient will be given Lokelma for the mild hyperkalemia. Patient also will need dialysis on arrival.   Subjective: A/O x4, negative CP, negative S OB.  This is the first episode of hemoptysis.  Mother states that started out as a cold that never really cleared up and then patient began to have episodes of hemoptysis.,   Assessment & Plan: Covid vaccination; positive vaccination   Principal Problem:   Hemoptysis Active Problems:   End stage renal disease on dialysis [facility])   Essential hypertension   History of pulmonary embolism    Occlusion of stent of peripheral artery   Dyslipidemia   Hemoptysis -patient on Coumadin due to h/o recurrent PE who had a recent URI and now with c/o hemoptysis -Her last episode was about 0200 AM -She does have significant pulmonary and esophageal collaterals with esophageal varices - but the patient appears certain that this is hemoptysis rather than hematemesis -Also with RML/RLL GGO which may indicate hemorrhage -Also with incidental RUL nodule which will need to be followed as an outpatient --Continue patient on heparin overnight, start on clear liquid diet.  If patient does not any further hematic emesis restart Coumadin on 6/30  ESRD on HD M/W/F -Patient on chronic MWF HD -Nephrology prn order set utilized -She does not appear to be volume overloaded or otherwise in need of acute HD; however, she did have hyperkalemia that required treatment with [facility] and so HD should be expedited if possible -Dr. Jonnie Finner was notified by pager that patient will need HD -Continue Renagel -Will require HD on 6/30  Hyperkalemia -Resolved  SVC stent occlusion -Per EMR D/w Dr. Scot Dock by [facility] to be subacute vs. chronic in nature -No surgical intervention appears to be indicated at this time -Will elevate head of bed  HTN/patient actually Hypotensive -Not on BP medication -Normal saline 29m/hr -Orthostatic vitals  HLD -Continue rosuvastatin 10 mg daily -Lipid panel pending  H/o PE -Unclear history,  but initially seen by Dr. Victory Dakin at [facility] in 2017 and noted to have had 2 episodes of thrombosis, indicating that she should stay on [facility] indefinitely -Currently on heparin drip will transition to Coumadin in a.m. if patient has no more episodes of hemoptysis   DVT prophylaxis: Heparin drip Code Status: Full Family Communication: 6/29 mother and sister present at bedside for discussion of plan of care Status is: Inpatient    Dispo: The patient is from: Home               Anticipated d/c is to: Home              Anticipated d/c date is: 6/30              Patient currently unstable s/p bronchoscopy with blood tinged BAL      Consultants:  PCCM   Procedures/Significant Events:  6/29 Bronchoscopy; Bronchoalveolar lavage was performed in the right lower lobe of the lung  and sent for routine cytology, aerobic culture and anaerobic culture. 60 mL of fluid were instilled. 30 mL were returned. The return was lood-tinged. There were no mucoid plugs in the return fluid. Impression: - Hemoptysis                       - Bronchoalveolar lavage was performed.   I have personally reviewed and interpreted all radiology studies and my findings are as above.  VENTILATOR SETTINGS:    Cultures   Antimicrobials:    Devices    LINES / TUBES:      Continuous Infusions: . sodium chloride       Objective: Vitals:   12/26/19 0830 12/26/19 0840 12/26/19 0850 12/26/19 0900  BP: (!) 82/51 95/60 (!) 81/58   Pulse: 90 96 80 70  Resp: _0 Temp:      TempSrc:      SpO2: 92% 100% 100% 92%  Weight:      Height:        Intake/Output Summary (Last 24 hours) at 12/26/2019 1497 Last data filed at 12/26/2019 0200 Gross per 24 hour  Intake 21.23 ml  Output 1500 ml  Net -1478.77 ml   Filed Weights   12/25/19 2156 12/26/19 0200 12/26/19 0711  Weight: 48.9 kg 47.4 kg 47.6 kg    Examination:  General: A/O x4 no acute respiratory distress Eyes: negative scleral hemorrhage, negative anisocoria, negative icterus ENT: Negative Runny nose, negative gingival bleeding, Neck:  Negative scars, masses, torticollis, lymphadenopathy, JVD Lungs: Clear to auscultation bilaterally without wheezes or crackles Cardiovascular: Regular rate and rhythm without murmur gallop or rub normal S1 and S2, left midline in place chest wall negative sign of infection Abdomen: negative abdominal pain, nondistended, positive soft, bowel sounds, no rebound, no ascites, no  appreciable mass Extremities: No significant cyanosis, clubbing, or edema bilateral lower extremities Skin: Negative rashes, lesions, ulcers Psychiatric:  Negative depression, negative anxiety, negative fatigue, negative mania  Central nervous system:  Cranial nerves II through XII intact, tongue/uvula midline, all extremities muscle strength 5/5, sensation intact throughout, negative dysarthria, negative expressive aphasia, negative receptive aphasia.  .     Data Reviewed: Care during the described time interval was provided by me .  I have reviewed this patient's available data, including medical history, events of note, physical examination, and all test results as part of my evaluation.  CBC: Recent Labs  Lab 12/24/19 2107 12/25/19 1008 12/26/19 0303  WBC 2.6*  --  2.9*  NEUTROABS 1.4*  --   --   HGB 12.5 11.9* 13.0  HCT 41.5 38.9 43.0  MCV 97.0  --  98.2  PLT 190  --  921   Basic Metabolic Panel: Recent Labs  Lab 12/24/19 2335 12/26/19 0303  NA 137 137  K 6.4* 3.9  CL 98 99  CO2 20* 22  GLUCOSE 94 59*  BUN 61* 18  CREATININE 13.39* 6.76*  CALCIUM 7.3* 8.1*   GFR: Estimated Creatinine Clearance: 9.1 mL/min (A) (by C-G formula based on SCr of 6.76 mg/dL (H)). Liver Function Tests: Recent Labs  Lab 12/24/19 2335  AST 26  ALT 15  ALKPHOS 113  BILITOT 0.6  PROT 8.7*  ALBUMIN 4.6   Recent Labs  Lab 12/24/19 2107  LIPASE 63*   No results for input(s): AMMONIA in the last 168 hours. Coagulation Profile: Recent Labs  Lab 12/24/19 2107  INR 1.9*   Cardiac Enzymes: No results for input(s): CKTOTAL, CKMB, CKMBINDEX, TROPONINI in the last 168 hours. BNP (last 3 results) No results for input(s): PROBNP in the last 8760 hours. HbA1C: No results for input(s): HGBA1C in the last 72 hours. CBG: No results for input(s): GLUCAP in the last 168 hours. Lipid Profile: No results for input(s): CHOL, HDL, LDLCALC, TRIG, CHOLHDL, LDLDIRECT in the last 72  hours. Thyroid Function Tests: No results for input(s): TSH, T4TOTAL, FREET4, T3FREE, THYROIDAB in the last 72 hours. Anemia Panel: No results for input(s): VITAMINB12, FOLATE, FERRITIN, TIBC, IRON, RETICCTPCT in the last 72 hours. Sepsis Labs: No results for input(s): PROCALCITON, LATICACIDVEN in the last 168 hours.  Recent Results (from the past 240 hour(s))  SARS Coronavirus 2 by RT PCR (hospital order, performed in [facility] hospital lab) Nasopharyngeal Nasopharyngeal Swab     Status: None   Collection Time: 12/24/19  9:12 PM   Specimen: Nasopharyngeal Swab  Result Value Ref Range Status   SARS Coronavirus 2 NEGATIVE NEGATIVE Final    Comment: (NOTE) SARS-CoV-2 target nucleic acids are NOT DETECTED.  The SARS-CoV-2 RNA is generally detectable in upper and lower respiratory specimens during the acute phase of infection. The lowest concentration of SARS-CoV-2 viral copies this assay can detect is 250 copies / mL. A negative result does not preclude SARS-CoV-2 infection and should not be used as the sole basis for treatment or other patient management decisions.  A negative result may occur with improper specimen collection / handling, submission of specimen other than nasopharyngeal swab, presence of viral mutation(s) within the areas targeted by this assay, and inadequate number of viral copies (<250 copies / mL). A negative result must be combined with clinical observations, patient history, and epidemiological information.  Fact Sheet for Patients:   StrictlyIdeas.no  Fact Sheet for Healthcare Providers: BankingDealers.co.za  This test is not yet approved or  cleared by the [place] FDA and has been authorized for detection and/or diagnosis of SARS-CoV-2 by FDA under an Emergency Use Authorization (EUA).  This EUA will remain in effect (meaning this test can be used) for the duration of the COVID-19 declaration under  Section 564(b)(1) of the Act, 21 U.S.C. section 360bbb-3(b)(1), unless the authorization is terminated or revoked sooner.  Performed at [facility], [address] [place], [place] [address]   Blood culture (routine x 2)     Status: None (Preliminary result)   Collection Time: 12/25/19  1:40 AM   Specimen: BLOOD LEFT ARM  Result Value Ref Range Status  Specimen Description   Final    BLOOD LEFT ARM Performed at [facility], [address] [place], [place] [address]    Special Requests   Final    BOTTLES DRAWN AEROBIC AND ANAEROBIC Blood Culture adequate volume Performed at [facility], [address] [place], [place] [address]    Culture   Final    NO GROWTH < 24 HOURS Performed at [facility], [place] [address] [place], [place] [address]    Report Status PENDING  Incomplete  Blood culture (routine x 2)     Status: None (Preliminary result)   Collection Time: 12/25/19  1:50 AM   Specimen: BLOOD RIGHT HAND  Result Value Ref Range Status   Specimen Description   Final    BLOOD RIGHT HAND Performed at [facility] [address] [place], [place] [address]    Special Requests   Final    BOTTLES DRAWN AEROBIC AND ANAEROBIC Blood Culture adequate volume Performed at [facility], [address] [place], [place] [address]    Culture   Final    NO GROWTH < 24 HOURS Performed at [facility], [place] [address] [place], [place] [address]    Report Status PENDING  Incomplete  MRSA PCR Screening     Status: None   Collection Time: 12/25/19  6:40 AM   Specimen: Nasopharyngeal  Result Value Ref Range Status   MRSA by PCR NEGATIVE NEGATIVE Final    Comment:        The GeneXpert MRSA Assay (FDA approved for NASAL specimens only), is one component of a comprehensive MRSA colonization surveillance program. It is not intended to diagnose MRSA infection nor to guide or monitor treatment for MRSA  infections. Performed at [facility], [place] [address] [place], [place] [address]          Radiology Studies: CT Angio Chest PE W and/or Wo Contrast  Addendum Date: 12/25/2019   ADDENDUM REPORT: 12/25/2019 01:45 ADDENDUM: The stent within the superior vena cava was in fact present on the previous CT, but was more difficult to appreciate due to contrast within the patent lumen. The occlusion stent near the atrial caval junction has occurred in the interim since 2019. Electronically Signed   By: Randa Ngo M.D.   On: 12/25/2019 01:45   Result Date: 12/25/2019 CLINICAL DATA:  Productive cough for 1 week, hemoptysis EXAM: CT ANGIOGRAPHY CHEST WITH CONTRAST TECHNIQUE: Multidetector CT imaging of the chest was performed using the standard protocol during bolus administration of intravenous contrast. Multiplanar CT image reconstructions and MIPs were obtained to evaluate the vascular anatomy. CONTRAST:  53m OMNIPAQUE IOHEXOL 350 MG/ML SOLN COMPARISON:  12/24/2017, 12/24/2019 FINDINGS: Cardiovascular: Contrast was injected via the left upper extremity. Since the previous exam, a stent has been placed within the superior vena cava just proximal to the atrial caval junction. There is evidence of complete occlusion of the stent, with extensive venous collateral opacification as result of the pressure injection. Contrast refluxes into numerous collateral veins in the mediastinum and retroperitoneum. This results in a nondiagnostic evaluation of the pulmonary vasculature due to insufficient contrast enhancement. Heart is unremarkable without pericardial effusion. Thoracic aorta is normal in caliber with no aneurysm or dissection. Mediastinum/Nodes: No enlarged mediastinal, hilar, or axillary lymph nodes. Thyroid gland, trachea, and esophagus demonstrate no significant findings. Lungs/Pleura: There is patchy ground-glass airspace disease within the right lung base within  the right middle and right lower  lobes. This could reflect aspiration or hemorrhage given clinical presentation. There is a 6 mm ground-glass right upper lobe nodule abutting the minor fissure, reference image 38. No other airspace disease, effusion, or pneumothorax. The central airways are patent. Upper Abdomen: Extensive venous collaterals are seen within the upper abdomen. This includes large varices surrounding the distal esophagus, which may account for the patient's hematemesis. Musculoskeletal: There are no acute or destructive bony lesions. Chronic renal osteodystrophy again noted. Reconstructed images demonstrates no additional findings. Review of the MIP images confirms the above findings. IMPRESSION: 1. Occlusion of the SVC stent. As result, the contrast power injected into the left upper extremity refluxes into numerous mediastinal and retroperitoneal venous collaterals. Opacification of the pulmonary vasculature is nondiagnostic for the detection of pulmonary emboli. 2. Right middle and right lower lobe ground-glass airspace disease which may reflect hemorrhage or aspiration. 3. 6 mm right upper lobe ground-glass nodule. Initial follow-up with CT at 6-12 months is recommended to confirm persistence. If persistent, repeat CT is recommended every 2 years until 5 years of stability has been established. This recommendation follows the consensus statement: Guidelines for Management of Incidental Pulmonary Nodules Detected on CT Images: From the Fleischner Society 2017; Radiology 2017; 284:228-243. 4. Numerous varices surround the distal esophagus, and may account for the patient's hematemesis. Electronically Signed: By: Randa Ngo M.D. On: 12/25/2019 00:31   DG Chest Portable 1 View  Result Date: 12/24/2019 CLINICAL DATA:  Cough. EXAM: PORTABLE CHEST 1 VIEW COMPARISON:  July 04, 2018 FINDINGS: There is a well-positioned left-sided tunneled dialysis catheter. A left brachiocephalic vein stent is noted. The heart size is stable.  There is a small right-sided pleural effusion which is stable from prior study. There is no pneumothorax. No focal infiltrate. IMPRESSION: 1. Stable small right-sided pleural effusion. 2. Well-positioned left-sided tunneled dialysis catheter. Electronically Signed   By: Constance Holster M.D.   On: 12/24/2019 21:53        Scheduled Meds: . [MAR Hold] Chlorhexidine Gluconate Cloth  6 each Topical Q0600  . [MAR Hold] cholecalciferol  2,000 Units Oral Daily  . diphenhydrAMINE      . [MAR Hold] doxercalciferol  1 mcg Intravenous Q M,W,F-HD  . [MAR Hold] famotidine  20 mg Oral BID  . [MAR Hold] folic acid  1 mg Oral Daily  . lidocaine  1 application Topical Once  . [MAR Hold] losartan  100 mg Oral Daily  . [MAR Hold] multivitamin  1 tablet Oral QHS  . [MAR Hold] rosuvastatin  10 mg Oral Daily  . [MAR Hold] sevelamer carbonate  800 mg Oral TID WC  . [MAR Hold] sodium chloride flush  3 mL Intravenous Q12H  . [MAR Hold] tranexamic acid  500 mg Nebulization Q8H   Continuous Infusions: . sodium chloride       LOS: 1 day    Time spent:40 min    Raywood Wailes, Geraldo Docker, MD [facility] Pager [phone]  If 7PM-7AM, please contact night-coverage [email or web address] Password [facility] 12/26/2019, 9:04 AM

## 2019-12-26 NOTE — Anesthesia Preprocedure Evaluation (Addendum)
Anesthesia Evaluation  Patient identified by MRN, date of birth, ID band Patient awake    Reviewed: Allergy & Precautions, NPO status , Patient's Chart, lab work & pertinent test results  Airway Mallampati: I  TM Distance: >3 FB Neck ROM: Full    Dental  (+) Teeth Intact, Dental Advisory Given   Pulmonary PE hemoptysis   Pulmonary exam normal breath sounds clear to auscultation       Cardiovascular hypertension, Normal cardiovascular exam Rhythm:Regular Rate:Normal     Neuro/Psych negative neurological ROS  negative psych ROS   GI/Hepatic Neg liver ROS, GERD  Medicated,  Endo/Other  negative endocrine ROS  Renal/GU ESRF and DialysisRenal disease (MWF; K+ 3.9)Focal segmental glomerulosclerosis     Musculoskeletal negative musculoskeletal ROS (+)   Abdominal   Peds  Hematology  (+) Blood dyscrasia (Coumadin), anemia ,   Anesthesia Other Findings Day of surgery medications reviewed with the patient.  Reproductive/Obstetrics                            Anesthesia Physical Anesthesia Plan  ASA: III  Anesthesia Plan: MAC   Post-op Pain Management:    Induction: Intravenous  PONV Risk Score and Plan: 2 and Propofol infusion, Treatment may vary due to age or medical condition and Midazolam  Airway Management Planned: Nasal Cannula and Natural Airway  Additional Equipment:   Intra-op Plan:   Post-operative Plan:   Informed Consent: I have reviewed the patients History and Physical, chart, labs and discussed the procedure including the risks, benefits and alternatives for the proposed anesthesia with the patient or authorized representative who has indicated his/her understanding and acceptance.     Dental advisory given  Plan Discussed with: CRNA and Anesthesiologist  Anesthesia Plan Comments:         Anesthesia Quick Evaluation

## 2019-12-26 NOTE — Op Note (Signed)
[facility] Cardiopulmonary Patient Name: Crystal Dunlap Date: 12/26/2019 MRN: [id number] Attending MD: Laurin Coder MD, MD Date of Birth: 1991/05/18 CSN: Finalized Age: 29 Admit Type: Inpatient Gender: Female Procedure:             Bronchoscopy Indications:           Hemoptysis, Hemoptysis with abnormal CXR Providers:             Mozell Hardacre A. Ander Slade MD, MD, Burtis Junes, RN, Theodora Blow,                         Technician Referring MD:           Medicines:             See the Anesthesia note for documentation of the                         administered medications, General Anesthesia Complications:         No immediate complications Estimated Blood Loss:  Estimated blood loss was minimal. Procedure:             Pre-Anesthesia Assessment:                        - A History and Physical has been performed. Patient                         meds and allergies have been reviewed. The risks and                         benefits of the procedure and the sedation options and                         risks were discussed with [Consent Obtained From]. All                         questions were answered and informed consent was                         obtained. Patient identification and proposed                         procedure were verified prior to the procedure by the                         physician in the pre-procedure area. Mental Status                         Examination: alert and oriented. Airway Examination:                         normal oropharyngeal airway. Respiratory Examination:                         clear to auscultation. CV Examination: normal. ASA                         Grade Assessment: II - A patient with mild systemic  disease. After reviewing the risks and benefits, the                         patient was deemed in satisfactory condition to                         undergo the procedure. The anesthesia plan was to use                          general anesthesia. Immediately prior to                         administration of medications, the patient was                         re-assessed for adequacy to receive sedatives. The                         heart rate, respiratory rate, oxygen saturations,                         blood pressure, adequacy of pulmonary ventilation, and                         response to care were monitored throughout the                         procedure. The physical status of the patient was                         re-assessed after the procedure.                        After obtaining informed consent, the bronchoscope was                         passed under direct vision. Throughout the procedure,                         the patient's blood pressure, pulse, and oxygen                         saturations were monitored continuously. the BF-H190                         ([id number]) Olympus Diagnostic Bronchoscope was                         introduced via the endotracheal tube (the patient was                         intubated for the procedure) and advanced to the                         tracheobronchial tree of both lungs. The procedure was                         accomplished without difficulty. The patient tolerated  the procedure well. The procedure was accomplished                         without difficulty. Scope In: Scope Out: Findings:      Bronchoalveolar lavage was performed in the right lower lobe of the lung       and sent for routine cytology, aerobic culture and anaerobic culture. 60       mL of fluid were instilled. 30 mL were returned. The return was       blood-tinged. There were no mucoid plugs in the return fluid. Impression:            - Hemoptysis                        - Bronchoalveolar lavage was performed.                        - Acute bronchitis was found. Moderate Sedation:      The administration of moderate sedation was  initiated at 07:49 AM.      An independent trained observer was present and continuously monitored       the patient. Recommendation:        - Await test results. Procedure Code(s):     --- Professional ---                        [phone], Bronchoscopy, rigid or flexible, including                         fluoroscopic guidance, when performed; with bronchial                         alveolar lavage Diagnosis Code(s):     --- Professional ---                        R04.2, Hemoptysis                        J20.9, Acute bronchitis, unspecified CPT copyright 2019 American Medical Association. All rights reserved. The codes documented in this report are preliminary and upon coder review may  be revised to meet current compliance requirements. Sherrilyn Rist, MD Laurin Coder MD, MD 12/26/2019 8:59:31 AM This report has been signed electronically. Number of Addenda: 0

## 2019-12-26 NOTE — Interval H&P Note (Signed)
History and Physical Interval Note:   12/26/2019 7:29 AM  Crystal Dunlap  has presented today for surgery, with the diagnosis of hemoptysis.  The various methods of treatment have been discussed with the patient and family. After consideration of risks, benefits and other options for treatment, the patient has consented to  Procedure(s): VIDEO BRONCHOSCOPY WITHOUT FLUORO (N/A) as a surgical intervention.  The patient's history has been reviewed, patient examined, no change in status, stable for surgery.  I have reviewed the patient's chart and labs.  Questions were answered to the patient's satisfaction.    Symptoms morning, hemodynamically stable, questions answered regarding procedure Agrees to proceed with bronchoscopy Lateshia Schmoker A Agron Swiney

## 2019-12-26 NOTE — Progress Notes (Signed)
[facility] Progress Note  Subjective: seen in room, SOB better, not coughing up much blood. Had bronch earlier today. Afebrile here. No new c/o.  Could only UF 1.5 of 3 L goal on HD yest d/t drop in BP's.   Vitals:   12/26/19 0945 12/26/19 1112 12/26/19 1223 12/26/19 1428  BP: 101/68 92/73    Pulse: 68 65 69   Resp: 16 14 15    Temp:  98.8 F (37.1 C)    TempSrc:  Oral    SpO2: 97% 96% 95% 96%  Weight:      Height:        Exam:  alert, nad   no jvd  Chest cta bilat  Cor reg no RG  Abd soft ntnd no ascites   Ext no LE edema   Alert, NF, ox3   L IJ TDC    OP HD: [facility] MWF   3.5h  350/800  47kg  2/2.5 bath  P2  Hep 3000  hect 1 ug tiw    Assessment/Plan: 1. Hemoptysis. Hx PE/DVT on Coumadin. CTA no infiltrates, mild GG changes at bases. Bronchoscopy today showed some bronchitis on the R side, started on pred 20mg  / d per CCM.  Did not tolerate extra fluid removal w/ HD yest, not vol issue.  2. ESRD -  HD MWF. HD tomorrow.  3. Hypertension/volume  - BP's soft to low-normal here, will dc losartan. Not sure she was on losartan at home, not on the home med list here, will check our home med list from the HD unit.  4. Anemia  - Hgb 11.9 Not on ESA. Follow trends.  5. Metabolic bone disease -  Ca low. Continue Ca acetate binder/Tums/Hectorol.  6. Hx PE/DVT  - on Coumadin. INR 1.9 on admission. Heparin/warfarin bridge per pharmacy      Rob Ranier 12/26/2019, 3:17 PM   Recent Labs  Lab 12/24/19 2107 12/24/19 2335 12/25/19 1008 12/26/19 0303  K  --  6.4*  --  3.9  BUN  --  61*  --  18  CREATININE  --  13.39*  --  6.76*  CALCIUM  --  7.3*  --  8.1*  HGB   < >  --  11.9* 13.0   < > = values in this interval not displayed.   Inpatient medications:  Chlorhexidine Gluconate Cloth  6 each Topical Q0600   cholecalciferol  2,000 Units Oral Daily   doxercalciferol  1 mcg Intravenous Q M,W,F-HD   famotidine  20 mg Oral BID   folic acid  1 mg Oral Daily    losartan  100 mg Oral Daily   multivitamin  1 tablet Oral QHS   [START ON 12/27/2019] predniSONE  20 mg Oral Q breakfast   rosuvastatin  10 mg Oral Daily   sevelamer carbonate  800 mg Oral TID WC   sodium chloride flush  3 mL Intravenous Q12H   tranexamic acid  500 mg Nebulization Q8H    heparin 700 Units/hr (12/26/19 1304)   acetaminophen **OR** acetaminophen, calcium carbonate (dosed in mg elemental calcium), camphor-menthol **AND** hydrOXYzine, docusate sodium, menthol-cetylpyridinium, ondansetron **OR** ondansetron (ZOFRAN) IV, sorbitol, zolpidem

## 2019-12-26 NOTE — Progress Notes (Signed)
ANTICOAGULATION CONSULT NOTE - Initial Consult  Pharmacy Consult for heparin Indication: pulmonary embolus  Allergies  Allergen Reactions  . Cefazolin Anaphylaxis  . Ferrlecit [Na Ferric Gluc Cplx In Sucrose] Anaphylaxis    Anaphylactic shock/severe angioedema on 08/09/15 related to IV iron Ferrlecit IV)  required intubation for respiratory failure  . Cyclosporine Other (See Comments)    Loss of motor skills  . Lactose Intolerance (Gi) Nausea Only    Stomach pains also  . Latex Rash  . Vancomycin Rash    Patient Measurements: Height: 5\' 2"  (157.5 cm) Weight: 47.6 kg (104 lb 15 oz) IBW/kg (Calculated) : 50.1 Heparin Dosing Weight: 47.6 kg   Vital Signs: Temp: 98.7 F (37.1 C) (06/29 1624) Temp Source: Oral (06/29 1624) BP: 99/77 (06/29 1624) Pulse Rate: 53 (06/29 1624)  Labs: Recent Labs    12/24/19 2107 12/24/19 2107 12/24/19 2335 12/25/19 1008 12/25/19 1613 12/26/19 0303 12/26/19 1817  HGB 12.5   < >  --  11.9*  --  13.0  --   HCT 41.5  --   --  38.9  --  43.0  --   PLT 190  --   --   --   --  170  --   LABPROT 21.3*  --   --   --   --   --   --   INR 1.9*  --   --   --   --   --   --   HEPARINUNFRC  --   --   --   --  <0.10*  --  0.26*  CREATININE  --   --  13.39*  --   --  6.76*  --    < > = values in this interval not displayed.    Estimated Creatinine Clearance: 9.1 mL/min (A) (by C-G formula based on SCr of 6.76 mg/dL (H)).   Medical History: Past Medical History:  Diagnosis Date  . Anemia   . Dyslipidemia 12/25/2019  . ESRD (end stage renal disease) on dialysis ([place]) 08/10/2008   MWF; [facility]; [place]. 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 on HD since 2010. TDC dependent for several yrs.    . Focal segmental glomerulosclerosis   . GERD (gastroesophageal reflux disease)   . Hyperlipidemia   . Hypertension   . 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 Secondary hyperparathyroidism ([place])   . SIRS (systemic inflammatory response syndrome) ([place]) 11/23/2016    Medications:  Scheduled:  . Chlorhexidine Gluconate Cloth  6 each Topical Q0600  . [START ON 12/27/2019] Chlorhexidine Gluconate Cloth  6 each Topical Q0600  . cholecalciferol  2,000 Units Oral Daily  . doxercalciferol  1 mcg Intravenous Q M,W,F-HD  . [START ON 12/27/2019] famotidine  20 mg Oral Daily  . folic acid  1 mg Oral Daily  . multivitamin  1 tablet Oral QHS  . [START ON 12/27/2019] predniSONE  20 mg Oral Q breakfast  . rosuvastatin  10 mg Oral Daily  . sevelamer carbonate  800 mg Oral TID WC  . sodium chloride flush  3 mL Intravenous Q12H  . tranexamic acid  500 mg Nebulization Q8H    Assessment: 28 yof on warfarin PTA for hx PE/DVT with known hx of ESRD - presenting with one day of hemoptysis. CT angiogram - chronic obstruction of SVC stent, non-diagnostic for  PE; concern of hemorrhage and distal esophageal varies noted.   INR on admission 6/27 was 1.9. CBC stable. No s/sx of bleeding. Okay per team to restart heparin infusion.  PM heparin level 0.26   Goal of Therapy:  Heparin level 0.3-0.5 units/ml Monitor platelets by anticoagulation protocol: Yes   Plan:  Heparin to 800 units / hr  Follow up AM labs  Plan to start warfarin on 6/30 if remains stable overnight bleeding wise  Thank you Anette Guarneri, PharmD 12/26/2019 6:50 PM  Please check AMION for all [place] phone numbers After 10:00 PM, call [place] [phone]

## 2019-12-27 ENCOUNTER — Encounter (HOSPITAL_COMMUNITY): Payer: Self-pay | Admitting: Pulmonary Disease

## 2019-12-27 LAB — CBC
HCT: 37.3 % (ref 36.0–46.0)
Hemoglobin: 11.2 g/dL — ABNORMAL LOW (ref 12.0–15.0)
MCH: 29.2 pg (ref 26.0–34.0)
MCHC: 30 g/dL (ref 30.0–36.0)
MCV: 97.1 fL (ref 80.0–100.0)
Platelets: 156 10*3/uL (ref 150–400)
RBC: 3.84 MIL/uL — ABNORMAL LOW (ref 3.87–5.11)
RDW: 15.7 % — ABNORMAL HIGH (ref 11.5–15.5)
WBC: 3.8 10*3/uL — ABNORMAL LOW (ref 4.0–10.5)
nRBC: 0 % (ref 0.0–0.2)

## 2019-12-27 LAB — COMPREHENSIVE METABOLIC PANEL
ALT: 13 U/L (ref 0–44)
AST: 24 U/L (ref 15–41)
Albumin: 3.6 g/dL (ref 3.5–5.0)
Alkaline Phosphatase: 86 U/L (ref 38–126)
Anion gap: 13 (ref 5–15)
BUN: 41 mg/dL — ABNORMAL HIGH (ref 6–20)
CO2: 22 mmol/L (ref 22–32)
Calcium: 6.6 mg/dL — ABNORMAL LOW (ref 8.9–10.3)
Chloride: 97 mmol/L — ABNORMAL LOW (ref 98–111)
Creatinine, Ser: 10.76 mg/dL — ABNORMAL HIGH (ref 0.44–1.00)
GFR calc Af Amer: 5 mL/min — ABNORMAL LOW (ref >60)
GFR calc non Af Amer: 4 mL/min — ABNORMAL LOW (ref >60)
Glucose, Bld: 199 mg/dL — ABNORMAL HIGH (ref 70–99)
Potassium: 4.5 mmol/L (ref 3.5–5.1)
Sodium: 132 mmol/L — ABNORMAL LOW (ref 135–145)
Total Bilirubin: 0.3 mg/dL (ref 0.3–1.2)
Total Protein: 7.2 g/dL (ref 6.5–8.1)

## 2019-12-27 LAB — HEPARIN LEVEL (UNFRACTIONATED)
Heparin Unfractionated: >2.2 IU/mL — ABNORMAL HIGH (ref 0.30–0.70)
Heparin Unfractionated: 1.07 IU/mL — ABNORMAL HIGH (ref 0.30–0.70)

## 2019-12-27 LAB — LIPID PANEL
Cholesterol: 71 mg/dL (ref 0–200)
HDL: 44 mg/dL (ref >40)
LDL Cholesterol: 15 mg/dL (ref 0–99)
Total CHOL/HDL Ratio: 1.6 RATIO
Triglycerides: 62 mg/dL (ref <150)
VLDL: 12 mg/dL (ref 0–40)

## 2019-12-27 LAB — MAGNESIUM: Magnesium: 2.3 mg/dL (ref 1.7–2.4)

## 2019-12-27 LAB — PHOSPHORUS: Phosphorus: 4.5 mg/dL (ref 2.5–4.6)

## 2019-12-27 LAB — PROTIME-INR
INR: 1.7 — ABNORMAL HIGH (ref 0.8–1.2)
Prothrombin Time: 19.2 seconds — ABNORMAL HIGH (ref 11.4–15.2)

## 2019-12-27 MED ORDER — DIPHENHYDRAMINE HCL 50 MG/ML IJ SOLN
INTRAMUSCULAR | Status: AC
  Administered 2019-12-27: 25 mg via ARTERIOVENOUS_FISTULA
  Filled 2019-12-27: qty 1

## 2019-12-27 MED ORDER — WARFARIN - PHARMACIST DOSING INPATIENT: Freq: Every day | Status: DC

## 2019-12-27 MED ORDER — HEPARIN SODIUM (PORCINE) 1000 UNIT/ML DIALYSIS: 3000.0000 [IU] | Freq: Once | INTRAMUSCULAR | Status: AC

## 2019-12-27 MED ORDER — HEPARIN (PORCINE) 25000 UT/250ML-% IV SOLN
650.0000 [IU]/h | INTRAVENOUS | Status: DC
  Administered 2019-12-27 (×2): 750 [IU]/h via INTRAVENOUS
  Administered 2019-12-29: 650 [IU]/h via INTRAVENOUS
  Filled 2019-12-27 (×2): qty 250

## 2019-12-27 MED ORDER — HEPARIN SODIUM (PORCINE) 1000 UNIT/ML IJ SOLN
INTRAMUSCULAR | Status: AC
  Administered 2019-12-27: 4000 [IU] via INTRAVENOUS_CENTRAL
  Filled 2019-12-27: qty 4

## 2019-12-27 MED ORDER — HEPARIN SODIUM (PORCINE) 1000 UNIT/ML DIALYSIS: 3000.0000 [IU] | Freq: Once | INTRAMUSCULAR | Status: DC

## 2019-12-27 MED ORDER — WARFARIN SODIUM 3 MG PO TABS
3.0000 mg | ORAL_TABLET | Freq: Once | ORAL | Status: AC
  Administered 2019-12-27: 3 mg via ORAL
  Filled 2019-12-27: qty 1

## 2019-12-27 NOTE — Progress Notes (Addendum)
NAME:  Crystal Dunlap, MRN:  [id number], DOB:  09/03/1990, LOS: 2 ADMISSION DATE:  12/24/2019, CONSULTATION DATE: 12/25/2019 REFERRING MD: Dr. Lorin Mercy, CHIEF COMPLAINT: Hemoptysis  Brief History   11 yoF with hx of PE/ DVT on coumadin with INR 1.9 and hx of ESRD 2/2 FSGS on iHD presenting with one day hx of hemoptysis.    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 Augustin Coupe and currently being evaluated for third renal transplant, and HTN presenting with one day history of hemoptysis.   She reports URI symptoms since last Monday with nasal congestion, sneezing, and coughing with intermittent yellowish phlegm.  Had tmax 100 Friday.  Last iHD on 6/25, Friday.  No sick contacts.  S/p both COVID vaccinations.  Reports yesterday she started coughing up bright red blood after a coughing spell.  At first it was mixed, but then became straight blood.  Reports coughing up "2-3 sytrofoam cups full".  The amount concerned her.  She has never had this before.  Denies any previous GI bleeding.  Denies any vomiting or aspiration episodes.  Reports her stools have been darker but no frank blood in stool.  Denies any chest pain, shortness of breath, syncope, swelling of extremities, or abdominal pain.  Her last episode was this morning around 2 am.    Workup notable for  Hgb 12.5, INR 1.9, PT 21.3, WBC 2.6 (stable, prior 2.3 in 06/2018), K 6.4, CO2 20, BUN 61, sCr 13.39, AG 19, lipase 63, neg SARS 2, CXR shows small chronic right pleural effusion without focal infiltrate. CTA PE performed which was non diagnostic for PE given chronic occlusion of the SVC stent with numerous mediastinal and retroperitoneal venous collaterals, right middle and lower lobe ground-glass airspace disease  which may reflect hemorrhage or aspiration, 6 mm RUL ground glass nodule, and numerous varices surrounding the distal esophagus.  Vascular surgery was consulted who advised medical treatment only at this time.  She is afebrile and has been hemodynamically stable and on room air.  She continues to deny any chest discomfort or shortness of breath.   She was admitted to [facility] and pulmonary consulted for further recommendations.  Past Medical History   Never smoker, Prior PE (2015) and DVT on Coumadin, ESRD 2/2 FSGS (age 23) on MWF iHD via left [place] vas cath, s/p 2 failed renal transplants, HT Significant Hospital Events   6/28 Admitted [place]  Consults:  PCCM  Procedures:  Bronchoscopy 12/26/2019  Significant Diagnostic Tests:  Echocardiogram May 2021 The left ventricle is normal in size. Ejection Fraction = 60-65%. The left ventricular wall motion is normal. Mild aortic regurgitation. There is mild mitral regurgitation. There is moderate tricuspid regurgitation. PA pressure estimate is normal. Normal RA and RV with normal right ventricular systolic function  0/35 CTA PE >> 1. Occlusion of the SVC stent. As result, the contrast power injected into the left upper extremity refluxes into numerous mediastinal and retroperitoneal venous collaterals. Opacification of the pulmonary vasculature is nondiagnostic for the detection of pulmonary emboli. 2. Right middle and right lower lobe ground-glass airspace disease which may reflect hemorrhage or aspiration. 3. 6 mm right upper lobe ground-glass nodule. Initial follow-up with CT at 6-12 months is recommended to confirm persistence.  4. Numerous varices surround the distal esophagus, and may account for the patient's hematemesis. ADDENDUM: The stent within the superior vena cava was in fact present on the previous CT, but was more difficult to appreciate due to contrast within the patent lumen. The occlusion stent near the atrial caval junction has  occurred in the interim since 2019.  Micro Data:  6/27 SARS 2 >> neg 6/28 BCx 2 >> 6/28 MRSA PCR >> ne trimester  Antimicrobials:  none  Interim history/subjective:  No further hemoptysis back on anticoagulation  Objective   Blood pressure 117/78, pulse 84, temperature 97.8 F (36.6 C), temperature source Oral, resp. rate 19, height 5\' 2"  (1.575 m), weight 47.6 kg, SpO2 94 %.        Intake/Output Summary (Last 24 hours) at 12/27/2019 1206 Last data filed at 12/27/2019 0800 Gross per 24 hour  Intake 264.19 ml  Output --  Net 264.19 ml   Filed Weights   12/25/19 2156 12/26/19 0200 12/26/19 0711  Weight: 48.9 kg 47.4 kg 47.6 kg    Examination: General-young lady in no acute distress currently on room air HEENT-moon pie face noted no JVD or lymphadenopathy is appreciated Chest-lungs are clear to auscultation Cardiac-heart sounds are regular regular rate and rhythm noted to have a right subclavian hemodialysis tunnel catheter Bilateral lower extremity edema   Resolved Hospital Problem list     Assessment & Plan:  Hemoptysis -S/p bronchoscopy -Bronchoscopy significant for evidence of bronchitis No clear source of bleeding now back on anticoagulation without further hemoptysis on 12/27/2019 Currently on prednisone 20 mg daily and wean as tolerated No apparent need for antibiotics Continue nebulizers    Anticoagulation has been restarted Anticoagulation has been resumed  Otherwise the patient to be discharged medialized Right upper lobe nodule Follow-up as an outpatient in 8 to 12 weeks  [place] Nurse Practitioner [place] Please consult [place] 12/27/2019, 12:06 PM

## 2019-12-27 NOTE — Progress Notes (Signed)
ANTICOAGULATION CONSULT NOTE -  Follow up Pharmacy Consult for heparin Indication: history of pulmonary embolus and DVT  Allergies  Allergen Reactions  . Cefazolin Anaphylaxis  . Ferrlecit [Na Ferric Gluc Cplx In Sucrose] Anaphylaxis    Anaphylactic shock/severe angioedema on 08/09/15 related to IV iron Ferrlecit IV)  required intubation for respiratory failure  . Cyclosporine Other (See Comments)    Loss of motor skills  . Lactose Intolerance (Gi) Nausea Only    Stomach pains also  . Latex Rash  . Vancomycin Rash    Patient Measurements: Height: 5\' 2"  (157.5 cm) Weight: 48.6 kg (107 lb 2.3 oz) IBW/kg (Calculated) : 50.1 Heparin Dosing Weight: 48.6 kg   Vital Signs: Temp: 98.5 F (36.9 C) (06/30 1522) Temp Source: Oral (06/30 1522) BP: 121/94 (06/30 1522) Pulse Rate: 63 (06/30 1522)  Labs: Recent Labs    12/24/19 2107 12/24/19 2107 12/24/19 2335 12/25/19 1008 12/25/19 1613 12/26/19 0303 12/26/19 1817 12/27/19 1200 12/27/19 1632  HGB 12.5   < >  --  11.9*  --  13.0  --  11.2*  --   HCT 41.5   < >  --  38.9  --  43.0  --  37.3  --   PLT 190  --   --   --   --  170  --  156  --   LABPROT 21.3*  --   --   --   --   --   --  19.2*  --   INR 1.9*  --   --   --   --   --   --  1.7*  --   HEPARINUNFRC  --   --   --   --    < >  --  0.26* >2.20* 1.07*  CREATININE  --   --  13.39*  --   --  6.76*  --  10.76*  --    < > = values in this interval not displayed.    Estimated Creatinine Clearance: 5.9 mL/min (A) (by C-G formula based on SCr of 10.76 mg/dL (H)).   Medical History: Past Medical History:  Diagnosis Date  . Anemia   . Dyslipidemia 12/25/2019  . ESRD (end stage renal disease) on dialysis ([place]) 08/10/2008   MWF; [facility]; [place]. 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 on HD since 2010. TDC dependent for several yrs.    . Focal segmental glomerulosclerosis   . GERD  (gastroesophageal reflux disease)   . Hyperlipidemia   . Hypertension   . 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 Secondary hyperparathyroidism ([place])   . SIRS (systemic inflammatory response syndrome) ([place]) 11/23/2016    Medications:  Scheduled:  . Chlorhexidine Gluconate Cloth  6 each Topical Q0600  . Chlorhexidine Gluconate Cloth  6 each Topical Q0600  . cholecalciferol  2,000 Units Oral Daily  . doxercalciferol  1 mcg Intravenous Q M,W,F-HD  . famotidine  20 mg Oral Daily  . folic acid  1 mg Oral Daily  . multivitamin  1 tablet Oral QHS  . predniSONE  20 mg Oral Q breakfast  . rosuvastatin  10 mg Oral Daily  . sevelamer carbonate  800 mg Oral TID WC  . sodium chloride flush  3 mL Intravenous Q12H  . tranexamic acid  500 mg Nebulization Q8H  . warfarin  3  mg Oral ONCE-1600  . Warfarin - Pharmacist Dosing Inpatient   Does not apply q1600    Assessment: 28 yof on warfarin PTA for hx PE/DVT with known hx of ESRD - presenting with one day of hemoptysis. CT angiogram - chronic obstruction of SVC stent, non-diagnostic for PE; concern of hemorrhage and distal esophageal varies noted.   INR on admission 6/27 was 1.9. CBC stable. No s/sx of bleeding. Okay per team to restart heparin infusion on 6/29. The first heparin level was 0.26 last night on heparin 700 units/hr> then increased heparin rate to 800 units/hr.  Heparin level this morning 6/30 is >2.2, level was drawn from HD IJ cath and the Hemodialysis RN said no dialysis heparin given.  HL >2.2 seems unusual, compared to prior heparin level, possibly inaccurate due to drawn from HD cath site.  No bleeding noted per HD RN.  Will need to draw heparin level about 1 hour after HD session completed.   INR is 1.7 today,  patient underwent bronchoscopy on 6/29.  Pulmonology think hemoptysis likely related to bronchitis.  SVC stent occlusion: No surgical intervention indicated at this time per [facility].  Pharmacy consulted to restart  Warfarin on 6/30 today. PTA warf 2mg  daily, LD taken 6/26 2100, INR 1.9 on admit 6/27. CBC stable hgb 11.9>13>11.2 stable, pltc 156 wnl but down  Heparin level drawn this afternoon supratherapeutic at 1.07.  Since 700 units/hr was not enough, will only decrease by 50 units/hr.   Goal of Therapy:  INR 2-3 Heparin level 0.3-0.5 units/ml Monitor platelets by anticoagulation protocol: Yes   Plan:  Hold heparin drip for 1 hour then restart Heparin drip at 750 units/hr Will recheck HL in 8 hours.  Daily HL, INR, and CBC  Alanda Slim, PharmD, [facility] Clinical Pharmacist Please see AMION for all Pharmacists' Contact Phone Numbers 12/27/2019, 5:09 PM

## 2019-12-27 NOTE — Plan of Care (Signed)
  Problem: Education: Goal: Knowledge of General Education information will improve Description: Including pain rating scale, medication(s)/side effects and non-pharmacologic comfort measures Outcome: Progressing   Problem: Clinical Measurements: Goal: Ability to maintain clinical measurements within normal limits will improve Outcome: Progressing   Problem: Clinical Measurements: Goal: Diagnostic test results will improve Outcome: Progressing   Problem: Nutrition: Goal: Adequate nutrition will be maintained Outcome: Progressing   Problem: Coping: Goal: Level of anxiety will decrease Outcome: Progressing   Problem: Pain Managment: Goal: General experience of comfort will improve Outcome: Progressing

## 2019-12-27 NOTE — Progress Notes (Signed)
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  She reports coughing up a lot of blood started the day prior to admission, prior red blood.  She had a cold and had been coughing and thinks this is what led to it.  She report low-grade fever.  No GI symptoms, no sick contacts.  She was feeling well other than mild cough onto the day prior to admission.  She is on Coumadin and her INR fluctuates. Patient had a CT angiogram which was negative for PE with show obstruction of her SVC stent.  Dr. Doren Custard on-call vascular surgery advised no intervention at this time. Consulted, patient underwent bronchoscopy on 6/29.  Pulmonology think hemoptysis likely related to  Bronchitis.  Plan to continue with heparin and resume Coumadin today.  Assessment & Plan:   Principal Problem:   Hemoptysis Active Problems:   End stage renal disease on dialysis [facility])   Essential hypertension   History of pulmonary embolism   Occlusion of stent of peripheral artery   Dyslipidemia  1-Hemoptysis: Evaluated by CCM, underwent bronchoscopy cultures negative so far cytology negative. Pulmonologist think hemoptysis is related to acute bronchitis. Patient was started on Prednisone.  -Patient has significant pulmonary esophageal collateral with esophageal varices, with patient appears certain that this is about his rather than hematemesis   ESRD on hemodialysis MWF: Received Lokelma for hyperkalemia. Patient seen on hemodialysis today  Hyperkalemia: Resolved  SVC stent occlusion: Per EMR discussion with Dr. Doren Custard by EDP.  Appears to be subacute versus chronic in nature.  No surgical intervention indicated at this  time.  Hypotension: Resolved  Hyperlipidemia: Continue with rosuvastatin  History of PE: Patient need to be on anticoagulation indefinitely Continue with Heparin resume Coumadin.  Leukopenia: Check B12  Estimated body mass index is 19.19 kg/m as calculated from the following:   Height as of this encounter: _0  (1.575 m).   Weight as of this encounter: 47.6 kg.   DVT prophylaxis: Heparin drip Code Status: Full code Family Communication: Care discussed with patient Disposition Plan:  Status is: Inpatient  Remains inpatient appropriate because:Hemodynamically unstable   Dispo: The patient is from: Home              Anticipated d/c is to: Home              Anticipated d/c date is: 2 days              Patient currently is not medically stable to d/c.  Plan to resume Coumadin today Home when INR therapeutic        Consultants:   CCM  Nephrology  Procedures:   HD  Bronchoscopy  Antimicrobials:    Subjective: Patient seen in hemodialysis.  She denies coughing any more blood.  She still have a cough. She denies any new complaints.  Objective: Vitals:   12/27/19 1330 12/27/19 1400 12/27/19 1430 12/27/19 1455  BP: 103/63 124/86 120/73 (!) 169/109  Pulse: (!) 58 (!) 53 (!) 54 (!) 49  Resp: _1 Temp:      TempSrc:      SpO2:      Weight:      Height:  Intake/Output Summary (Last 24 hours) at 12/27/2019 1509 Last data filed at 12/27/2019 1455 Gross per 24 hour  Intake 264.19 ml  Output 1000 ml  Net -735.81 ml   Filed Weights   12/25/19 2156 12/26/19 0200 12/26/19 0711  Weight: 48.9 kg 47.4 kg 47.6 kg    Examination:  General exam: Appears calm and comfortable  Respiratory system: Clear to auscultation. Respiratory effort normal. Cardiovascular system: S1 & S2 heard, RRR. No JVD, murmurs, rubs, gallops or clicks. No pedal edema. Gastrointestinal system: Abdomen is nondistended, soft and nontender. No organomegaly or masses felt.  Normal bowel sounds heard. Central nervous system: Alert and oriented. Extremities: Symmetric 5 x 5 power.    Data Reviewed: I have personally reviewed following labs and imaging studies  CBC: Recent Labs  Lab 12/24/19 2107 12/25/19 1008 12/26/19 0303 12/27/19 1200  WBC 2.6*  --  2.9* 3.8*  NEUTROABS 1.4*  --   --   --   HGB 12.5 11.9* 13.0 11.2*  HCT 41.5 38.9 43.0 37.3  MCV 97.0  --  98.2 97.1  PLT 190  --  170 482   Basic Metabolic Panel: Recent Labs  Lab 12/24/19 2335 12/26/19 0303 12/27/19 1200  NA 137 137 132*  K 6.4* 3.9 4.5  CL 98 99 97*  CO2 20* 22 22  GLUCOSE 94 59* 199*  BUN 61* 18 41*  CREATININE 13.39* 6.76* 10.76*  CALCIUM 7.3* 8.1* 6.6*  MG  --   --  2.3  PHOS  --   --  4.5   GFR: Estimated Creatinine Clearance: 5.7 mL/min (A) (by C-G formula based on SCr of 10.76 mg/dL (H)). Liver Function Tests: Recent Labs  Lab 12/24/19 2335 12/27/19 1200  AST 26 24  ALT 15 13  ALKPHOS 113 86  BILITOT 0.6 0.3  PROT 8.7* 7.2  ALBUMIN 4.6 3.6   Recent Labs  Lab 12/24/19 2107  LIPASE 63*   No results for input(s): AMMONIA in the last 168 hours. Coagulation Profile: Recent Labs  Lab 12/24/19 2107 12/27/19 1200  INR 1.9* 1.7*   Cardiac Enzymes: No results for input(s): CKTOTAL, CKMB, CKMBINDEX, TROPONINI in the last 168 hours. BNP (last 3 results) No results for input(s): PROBNP in the last 8760 hours. HbA1C: No results for input(s): HGBA1C in the last 72 hours. CBG: Recent Labs  Lab 12/26/19 0738 12/26/19 0842  GLUCAP 68* 73   Lipid Profile: Recent Labs    12/27/19 1200  CHOL 71  HDL 44  LDLCALC 15  TRIG 62  CHOLHDL 1.6   Thyroid Function Tests: No results for input(s): TSH, T4TOTAL, FREET4, T3FREE, THYROIDAB in the last 72 hours. Anemia Panel: No results for input(s): VITAMINB12, FOLATE, FERRITIN, TIBC, IRON, RETICCTPCT in the last 72 hours. Sepsis Labs: No results for input(s): PROCALCITON, LATICACIDVEN in the last 168  hours.  Recent Results (from the past 240 hour(s))  SARS Coronavirus 2 by RT PCR (hospital order, performed in [facility] hospital lab) Nasopharyngeal Nasopharyngeal Swab     Status: None   Collection Time: 12/24/19  9:12 PM   Specimen: Nasopharyngeal Swab  Result Value Ref Range Status   SARS Coronavirus 2 NEGATIVE NEGATIVE Final    Comment: (NOTE) SARS-CoV-2 target nucleic acids are NOT DETECTED.  The SARS-CoV-2 RNA is generally detectable in upper and lower respiratory specimens during the acute phase of infection. The lowest concentration of SARS-CoV-2 viral copies this assay can detect is 250 copies / mL. A negative result does not preclude  SARS-CoV-2 infection and should not be used as the sole basis for treatment or other patient management decisions.  A negative result may occur with improper specimen collection / handling, submission of specimen other than nasopharyngeal swab, presence of viral mutation(s) within the areas targeted by this assay, and inadequate number of viral copies (<250 copies / mL). A negative result must be combined with clinical observations, patient history, and epidemiological information.  Fact Sheet for Patients:   StrictlyIdeas.no  Fact Sheet for Healthcare Providers: BankingDealers.co.za  This test is not yet approved or  cleared by the [place] FDA and has been authorized for detection and/or diagnosis of SARS-CoV-2 by FDA under an Emergency Use Authorization (EUA).  This EUA will remain in effect (meaning this test can be used) for the duration of the COVID-19 declaration under Section 564(b)(1) of the Act, 21 U.S.C. section 360bbb-3(b)(1), unless the authorization is terminated or revoked sooner.  Performed at [facility], [address] [place], [place] [address]   Blood culture (routine x 2)     Status: None (Preliminary result)   Collection Time: 12/25/19  1:40 AM    Specimen: BLOOD LEFT ARM  Result Value Ref Range Status   Specimen Description   Final    BLOOD LEFT ARM Performed at [facility], [address] [place], [place] [address]    Special Requests   Final    BOTTLES DRAWN AEROBIC AND ANAEROBIC Blood Culture adequate volume Performed at [facility], [address] [place], [place] [address]    Culture   Final    NO GROWTH 2 DAYS Performed at [facility], [place] [address] [place], [place] [address]    Report Status PENDING  Incomplete  Blood culture (routine x 2)     Status: None (Preliminary result)   Collection Time: 12/25/19  1:50 AM   Specimen: BLOOD RIGHT HAND  Result Value Ref Range Status   Specimen Description   Final    BLOOD RIGHT HAND Performed at [facility], [address] [place], [place] [address]    Special Requests   Final    BOTTLES DRAWN AEROBIC AND ANAEROBIC Blood Culture adequate volume Performed at [facility], [address] [place], [place] [address]    Culture   Final    NO GROWTH 2 DAYS Performed at [facility], [place] [address], [place], [place] [address]    Report Status PENDING  Incomplete  MRSA PCR Screening     Status: None   Collection Time: 12/25/19  6:40 AM   Specimen: Nasopharyngeal  Result Value Ref Range Status   MRSA by PCR NEGATIVE NEGATIVE Final    Comment:        The GeneXpert MRSA Assay (FDA approved for NASAL specimens only), is one component of a comprehensive MRSA colonization surveillance program. It is not intended to diagnose MRSA infection nor to guide or monitor treatment for MRSA infections. Performed at [facility], [place] [address], [place], [place] [address]   Culture, respiratory     Status: None (Preliminary result)   Collection Time: 12/26/19  8:18 AM   Specimen: Bronchoalveolar Lavage; Respiratory  Result Value Ref Range Status   Specimen Description BRONCHIAL ALVEOLAR LAVAGE  Final   Special  Requests NONE  Final   Gram Stain   Final    RARE WBC PRESENT,BOTH PMN AND MONONUCLEAR NO ORGANISMS SEEN  Culture   Final    CULTURE REINCUBATED FOR BETTER GROWTH Performed at [facility], [place] [address], [place], [place] [address]    Report Status PENDING  Incomplete         Radiology Studies: No results found.      Scheduled Meds: . Chlorhexidine Gluconate Cloth  6 each Topical Q0600  . Chlorhexidine Gluconate Cloth  6 each Topical Q0600  . cholecalciferol  2,000 Units Oral Daily  . diphenhydrAMINE      . doxercalciferol  1 mcg Intravenous Q M,W,F-HD  . famotidine  20 mg Oral Daily  . folic acid  1 mg Oral Daily  . heparin  3,000 Units Dialysis Once in dialysis  . heparin sodium (porcine)      . multivitamin  1 tablet Oral QHS  . predniSONE  20 mg Oral Q breakfast  . rosuvastatin  10 mg Oral Daily  . sevelamer carbonate  800 mg Oral TID WC  . sodium chloride flush  3 mL Intravenous Q12H  . tranexamic acid  500 mg Nebulization Q8H   Continuous Infusions: . sodium chloride 75 mL/hr at 12/27/19 1023  . heparin 800 Units/hr (12/26/19 1933)     LOS: 2 days    Time spent: 35 minutes.     Elmarie Shiley, MD [facility]   If 7PM-7AM, please contact night-coverage [email or web address]  12/27/2019, 3:09 PM

## 2019-12-27 NOTE — Progress Notes (Signed)
ANTICOAGULATION CONSULT NOTE -  Follow up Pharmacy Consult for heparin Indication: history of pulmonary embolus and DVT  Allergies  Allergen Reactions   Cefazolin Anaphylaxis   Ferrlecit [Na Ferric Gluc Cplx In Sucrose] Anaphylaxis    Anaphylactic shock/severe angioedema on 08/09/15 related to IV iron Ferrlecit IV)  required intubation for respiratory failure   Cyclosporine Other (See Comments)    Loss of motor skills   Lactose Intolerance (Gi) Nausea Only    Stomach pains also   Latex Rash   Vancomycin Rash    Patient Measurements: Height: 5\' 2"  (157.5 cm) Weight: 48.6 kg (107 lb 2.3 oz) IBW/kg (Calculated) : 50.1 Heparin Dosing Weight: 48.6 kg   Vital Signs: Temp: 98.5 F (36.9 C) (06/30 1522) Temp Source: Oral (06/30 1522) BP: 121/94 (06/30 1522) Pulse Rate: 63 (06/30 1522)  Labs: Recent Labs    12/24/19 2107 12/24/19 2107 12/24/19 2335 12/25/19 1008 12/25/19 1008 12/25/19 1613 12/26/19 0303 12/26/19 1817 12/27/19 1200  HGB 12.5   < >  --  11.9*   < >  --  13.0  --  11.2*  HCT 41.5   < >  --  38.9  --   --  43.0  --  37.3  PLT 190  --   --   --   --   --  170  --  156  LABPROT 21.3*  --   --   --   --   --   --   --  19.2*  INR 1.9*  --   --   --   --   --   --   --  1.7*  HEPARINUNFRC  --   --   --   --   --  <0.10*  --  0.26* >2.20*  CREATININE  --   --  13.39*  --   --   --  6.76*  --  10.76*   < > = values in this interval not displayed.    Estimated Creatinine Clearance: 5.9 mL/min (A) (by C-G formula based on SCr of 10.76 mg/dL (H)).   Medical History: Past Medical History:  Diagnosis Date   Anemia    Dyslipidemia 12/25/2019   ESRD (end stage renal disease) on dialysis ([place]) 08/10/2008   MWF; [facility]; [place]. 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 on HD since 2010. TDC dependent for several yrs.     Focal segmental glomerulosclerosis     GERD (gastroesophageal reflux disease)    Hyperlipidemia    Hypertension    Pulmonary emboli (HCC)    x2, on Coumadin lifelong   Secondary hyperparathyroidism ([place])    SIRS (systemic inflammatory response syndrome) (HCC) 11/23/2016    Medications:  Scheduled:   Chlorhexidine Gluconate Cloth  6 each Topical Q0600   Chlorhexidine Gluconate Cloth  6 each Topical Q0600   cholecalciferol  2,000 Units Oral Daily   doxercalciferol  1 mcg Intravenous Q M,W,F-HD   famotidine  20 mg Oral Daily   folic acid  1 mg Oral Daily   multivitamin  1 tablet Oral QHS   predniSONE  20 mg Oral Q breakfast   rosuvastatin  10 mg Oral Daily   sevelamer carbonate  800 mg Oral TID WC   sodium chloride flush  3 mL Intravenous Q12H   tranexamic acid  500 mg Nebulization Q8H    Assessment: 29  yof on warfarin PTA for hx PE/DVT with known hx of ESRD - presenting with one day of hemoptysis. CT angiogram - chronic obstruction of SVC stent, non-diagnostic for PE; concern of hemorrhage and distal esophageal varies noted.   INR on admission 6/27 was 1.9. CBC stable. No s/sx of bleeding. Okay per team to restart heparin infusion on 6/29. The first heparin level was 0.26 last night on heparin 700 units/hr> then increased heparin rate to 800 units/hr.  Heparin level this morning 6/30 is >2.2, level was drawn from HD IJ cath and the Hemodialysis RN said no dialysis heparin given.  HL >2.2 seems unusual, compared to prior heparin level, possibly inaccurate due to drawn from HD cath site.  No bleeding noted per HD RN.  Will need to draw heparin level about 1 hour after HD session completed.   INR is 1.7 today,  patient underwent bronchoscopy on 6/29.  Pulmonology think hemoptysis likely related to bronchitis.  SVC stent occlusion: No surgical intervention indicated at this time per [facility].  Pharmacy consulted to restart Warfarin on 6/30 today. PTA warf 2mg  daily, LD taken 6/26 2100, INR 1.9 on admit 6/27. CBC  stable hgb 11.9>13>11.2 stable, pltc 156 wnl but down    Goal of Therapy:  INR 2-3 Heparin level 0.3-0.5 units/ml Monitor platelets by anticoagulation protocol: Yes   Plan:  Heparin drip at 800 units / hr ,  Will recheck HL peripheral draw ~ 1 hr after end of HD.  Give Warfarin 3 mg x1 Daily HL, INR, and CBC  Thank you Nicole Cella, RPh 12/27/2019 4:01 PM  Please check AMION for all [place] phone numbers After 10:00 PM, call [place] [phone]

## 2019-12-28 ENCOUNTER — Inpatient Hospital Stay (HOSPITAL_COMMUNITY): Payer: Medicare Other

## 2019-12-28 DIAGNOSIS — R042 Hemoptysis: Secondary | ICD-10-CM

## 2019-12-28 HISTORY — DX: Hemoptysis: R04.2

## 2019-12-28 LAB — COMPREHENSIVE METABOLIC PANEL
ALT: 13 U/L (ref 0–44)
AST: 24 U/L (ref 15–41)
Albumin: 3.6 g/dL (ref 3.5–5.0)
Alkaline Phosphatase: 78 U/L (ref 38–126)
Anion gap: 10 (ref 5–15)
BUN: 24 mg/dL — ABNORMAL HIGH (ref 6–20)
CO2: 25 mmol/L (ref 22–32)
Calcium: 6.5 mg/dL — ABNORMAL LOW (ref 8.9–10.3)
Chloride: 101 mmol/L (ref 98–111)
Creatinine, Ser: 6.98 mg/dL — ABNORMAL HIGH (ref 0.44–1.00)
GFR calc Af Amer: 8 mL/min — ABNORMAL LOW (ref >60)
GFR calc non Af Amer: 7 mL/min — ABNORMAL LOW (ref >60)
Glucose, Bld: 93 mg/dL (ref 70–99)
Potassium: 4.5 mmol/L (ref 3.5–5.1)
Sodium: 136 mmol/L (ref 135–145)
Total Bilirubin: 0.6 mg/dL (ref 0.3–1.2)
Total Protein: 6.8 g/dL (ref 6.5–8.1)

## 2019-12-28 LAB — HEPARIN LEVEL (UNFRACTIONATED)
Heparin Unfractionated: 0.64 IU/mL (ref 0.30–0.70)
Heparin Unfractionated: 0.43 IU/mL (ref 0.30–0.70)

## 2019-12-28 LAB — VITAMIN B12: Vitamin B-12: 1463 pg/mL — ABNORMAL HIGH (ref 180–914)

## 2019-12-28 LAB — MAGNESIUM: Magnesium: 2.1 mg/dL (ref 1.7–2.4)

## 2019-12-28 LAB — PROTIME-INR
INR: 1.5 — ABNORMAL HIGH (ref 0.8–1.2)
Prothrombin Time: 17.7 seconds — ABNORMAL HIGH (ref 11.4–15.2)

## 2019-12-28 LAB — CULTURE, RESPIRATORY W GRAM STAIN

## 2019-12-28 LAB — CBC
HCT: 36.4 % (ref 36.0–46.0)
Hemoglobin: 11.1 g/dL — ABNORMAL LOW (ref 12.0–15.0)
MCH: 29.1 pg (ref 26.0–34.0)
MCHC: 30.5 g/dL (ref 30.0–36.0)
MCV: 95.5 fL (ref 80.0–100.0)
Platelets: 159 10*3/uL (ref 150–400)
RBC: 3.81 MIL/uL — ABNORMAL LOW (ref 3.87–5.11)
RDW: 15.6 % — ABNORMAL HIGH (ref 11.5–15.5)
WBC: 2.8 10*3/uL — ABNORMAL LOW (ref 4.0–10.5)
nRBC: 0 % (ref 0.0–0.2)

## 2019-12-28 LAB — PHOSPHORUS: Phosphorus: 3.6 mg/dL (ref 2.5–4.6)

## 2019-12-28 MED ORDER — CHLORHEXIDINE GLUCONATE CLOTH 2 % EX PADS
6.0000 | MEDICATED_PAD | Freq: Every day | CUTANEOUS | Status: DC
  Administered 2019-12-31: 6 via TOPICAL

## 2019-12-28 MED ORDER — CALCIUM GLUCONATE-NACL 1-0.675 GM/50ML-% IV SOLN
1.0000 g | Freq: Once | INTRAVENOUS | Status: AC
  Administered 2019-12-28: 1000 mg via INTRAVENOUS
  Filled 2019-12-28: qty 50

## 2019-12-28 MED ORDER — CALCIUM CARBONATE ANTACID 500 MG PO CHEW
2.0000 | CHEWABLE_TABLET | Freq: Once | ORAL | Status: AC
  Administered 2019-12-28: 400 mg via ORAL
  Filled 2019-12-28: qty 2

## 2019-12-28 MED ORDER — HYDROMORPHONE HCL 2 MG/ML IJ SOLN: 0.5000 mg | Freq: Once | INTRAMUSCULAR | Status: DC

## 2019-12-28 MED ORDER — CALCIUM ACETATE (PHOS BINDER) 667 MG PO CAPS
1334.0000 mg | ORAL_CAPSULE | Freq: Three times a day (TID) | ORAL | Status: DC
  Administered 2019-12-28 – 2020-01-02 (×12): 1334 mg via ORAL
  Filled 2019-12-28 (×14): qty 2

## 2019-12-28 MED ORDER — TRAMADOL HCL 50 MG PO TABS
50.0000 mg | ORAL_TABLET | Freq: Four times a day (QID) | ORAL | Status: DC | PRN
  Administered 2019-12-28 – 2019-12-30 (×4): 50 mg via ORAL
  Filled 2019-12-28 (×4): qty 1

## 2019-12-28 MED ORDER — SORBITOL 70 % SOLN
30.0000 mL | Freq: Once | Status: AC
  Administered 2019-12-28: 30 mL via ORAL
  Filled 2019-12-28: qty 30

## 2019-12-28 MED ORDER — HYDROMORPHONE HCL 1 MG/ML IJ SOLN
0.5000 mg | Freq: Once | INTRAMUSCULAR | Status: AC
  Administered 2019-12-28: 0.5 mg via INTRAVENOUS
  Filled 2019-12-28: qty 0.5

## 2019-12-28 MED ORDER — CALCIUM CARBONATE ANTACID 500 MG PO CHEW
1.0000 | CHEWABLE_TABLET | Freq: Every day | ORAL | Status: DC
  Administered 2019-12-29 – 2020-01-02 (×5): 200 mg via ORAL
  Filled 2019-12-28 (×5): qty 1

## 2019-12-28 MED ORDER — WARFARIN SODIUM 10 MG PO TABS
10.0000 mg | ORAL_TABLET | Freq: Once | ORAL | Status: AC
  Administered 2019-12-28: 10 mg via ORAL
  Filled 2019-12-28: qty 1

## 2019-12-28 NOTE — Progress Notes (Signed)
Patient has been experiencing more pain in lower left abdominal area today, gave tylenol and tramadol PO earlier in the day that helped, relieved pain from 8 out of 10 score down to 2 out of 10 score---at 1700, patient complaining of same pain again, I gave tylenol and tramadol, but pain has not been relieved ---I texted dr Tyrell Antonio to inform, she has given one time dose dilaudid IV, I have bladder scanned with results of only 10 ml urine, dr Tyrell Antonio advised, and she has also ordered abdominal xray---patient has also taken other meds ordered in case she has constipation

## 2019-12-28 NOTE — Progress Notes (Signed)
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  She reports coughing up a lot of blood started the day prior to admission, prior red blood.  She had a cold and had been coughing and thinks this is what led to it.  She report low-grade fever.  No GI symptoms, no sick contacts.  She was feeling well other than mild cough onto the day prior to admission.  She is on Coumadin and her INR fluctuates. Patient had a CT angiogram which was negative for PE with show obstruction of her SVC stent.  Dr. Doren Custard on-call vascular surgery advised no intervention at this time. Consulted, patient underwent bronchoscopy on 6/29.  Pulmonology think hemoptysis likely related to  Bronchitis.  Plan to continue with heparin and resume Coumadin today.  Assessment & Plan:   Principal Problem:   Hemoptysis Active Problems:   End stage renal disease on dialysis [facility])   Essential hypertension   History of pulmonary embolism   Occlusion of stent of peripheral artery   Dyslipidemia  1-Hemoptysis: Evaluated by CCM, underwent bronchoscopy cultures negative so far and  cytology negative. Pulmonologist think hemoptysis is related to acute bronchitis. Patient was started on Prednisone.  -Patient has significant pulmonary esophageal collateral with esophageal varices, with patient appears certain that this is about his rather than hematemesis Hb stable.   Hypocalcemia; replete IV>   ESRD on hemodialysis MWF: Received Lokelma for hyperkalemia. Patient seen on hemodialysis today  Hyperkalemia: Resolved  SVC stent occlusion: Per EMR discussion with Dr. Doren Custard by EDP.  Appears to be subacute versus chronic in nature.  No surgical  intervention indicated at this time.  Hypotension: Resolved  Hyperlipidemia: Continue with rosuvastatin  History of PE: Patient need to be on anticoagulation indefinitely Continue with Heparin resume Coumadin. INR 1.5  Leukopenia:  B12 normal.  Left LE pain; probably related to constipation. Sorbitol ordered.   Estimated body mass index is 19.6 kg/m as calculated from the following:   Height as of this encounter: _0  (1.575 m).   Weight as of this encounter: 48.6 kg.   DVT prophylaxis: Heparin drip Code Status: Full code Family Communication: Care discussed with patient Disposition Plan:  Status is: Inpatient  Remains inpatient appropriate because:Hemodynamically unstable   Dispo: The patient is from: Home              Anticipated d/c is to: Home              Anticipated d/c date is: 2 days              Patient currently is not medically stable to d/c.  Plan to resume Coumadin today Home when INR therapeutic        Consultants:   CCM  Nephrology  Procedures:   HD  Bronchoscopy  Antimicrobials:    Subjective: Feeling well. Denies hemoptysis. Cough persist.   Objective: Vitals:   12/28/19 0351 12/28/19 0530 12/28/19 0714 12/28/19 1106  BP: 113/76  (!) 126/100 (!) 123/96  Pulse: 64  64 63  Resp: _1 Temp: 97.9 F (36.6 C)  97.7 F (36.5 C) 98.1 F (36.7 C)  TempSrc: Oral  Oral Oral  SpO2: 93% 95% 92% 92%  Weight:      Height:        Intake/Output Summary (Last 24 hours) at 12/28/2019 1240 Last data filed at 12/28/2019 1218 Gross per 24 hour  Intake 1452.23 ml  Output 1000 ml  Net 452.23 ml   Filed Weights   12/26/19 0711 12/27/19 1137 12/27/19 1455  Weight: 47.6 kg 49.7 kg 48.6 kg    Examination:  General exam: NAD Respiratory system: CTA Cardiovascular system: S 1, S 2 RRR Gastrointestinal system: BS present, soft, nt Central nervous system: alert, and oriented Extremities: symmetric power.    Data Reviewed: I have  personally reviewed following labs and imaging studies  CBC: Recent Labs  Lab 12/24/19 2107 12/25/19 1008 12/26/19 0303 12/27/19 1200 12/28/19 0307  WBC 2.6*  --  2.9* 3.8* 2.8*  NEUTROABS 1.4*  --   --   --   --   HGB 12.5 11.9* 13.0 11.2* 11.1*  HCT 41.5 38.9 43.0 37.3 36.4  MCV 97.0  --  98.2 97.1 95.5  PLT 190  --  170 156 882   Basic Metabolic Panel: Recent Labs  Lab 12/24/19 2335 12/26/19 0303 12/27/19 1200 12/28/19 0307  NA 137 137 132* 136  K 6.4* 3.9 4.5 4.5  CL 98 99 97* 101  CO2 20* _0 GLUCOSE 94 59* 199* 93  BUN 61* 18 41* 24*  CREATININE 13.39* 6.76* 10.76* 6.98*  CALCIUM 7.3* 8.1* 6.6* 6.5*  MG  --   --  2.3 2.1  PHOS  --   --  4.5 3.6   GFR: Estimated Creatinine Clearance: 9 mL/min (A) (by C-G formula based on SCr of 6.98 mg/dL (H)). Liver Function Tests: Recent Labs  Lab 12/24/19 2335 12/27/19 1200 12/28/19 0307  AST _1 ALT _2 ALKPHOS 113 86 78  BILITOT 0.6 0.3 0.6  PROT 8.7* 7.2 6.8  ALBUMIN 4.6 3.6 3.6   Recent Labs  Lab 12/24/19 2107  LIPASE 63*   No results for input(s): AMMONIA in the last 168 hours. Coagulation Profile: Recent Labs  Lab 12/24/19 2107 12/27/19 1200 12/28/19 0307  INR 1.9* 1.7* 1.5*   Cardiac Enzymes: No results for input(s): CKTOTAL, CKMB, CKMBINDEX, TROPONINI in the last 168 hours. BNP (last 3 results) No results for input(s): PROBNP in the last 8760 hours. HbA1C: No results for input(s): HGBA1C in the last 72 hours. CBG: Recent Labs  Lab 12/26/19 0738 12/26/19 0842  GLUCAP 68* 73   Lipid Profile: Recent Labs    12/27/19 1200  CHOL 71  HDL 44  LDLCALC 15  TRIG 62  CHOLHDL 1.6   Thyroid Function Tests: No results for input(s): TSH, T4TOTAL, FREET4, T3FREE, THYROIDAB in the last 72 hours. Anemia Panel: Recent Labs    12/28/19 0307  VITAMINB12 1,463*   Sepsis Labs: No results for input(s): PROCALCITON, LATICACIDVEN in the last 168 hours.  Recent Results (from the  past 240 hour(s))  SARS Coronavirus 2 by RT PCR (hospital order, performed in [facility] hospital lab) Nasopharyngeal Nasopharyngeal Swab     Status: None   Collection Time: 12/24/19  9:12 PM   Specimen: Nasopharyngeal Swab  Result Value Ref Range Status   SARS Coronavirus 2 NEGATIVE NEGATIVE Final    Comment: (NOTE) SARS-CoV-2 target nucleic acids are NOT DETECTED.  The SARS-CoV-2 RNA is generally detectable in upper and lower respiratory specimens during the acute phase of infection. The lowest concentration of SARS-CoV-2 viral copies this assay can detect  is 250 copies / mL. A negative result does not preclude SARS-CoV-2 infection and should not be used as the sole basis for treatment or other patient management decisions.  A negative result may occur with improper specimen collection / handling, submission of specimen other than nasopharyngeal swab, presence of viral mutation(s) within the areas targeted by this assay, and inadequate number of viral copies (<250 copies / mL). A negative result must be combined with clinical observations, patient history, and epidemiological information.  Fact Sheet for Patients:   StrictlyIdeas.no  Fact Sheet for Healthcare Providers: BankingDealers.co.za  This test is not yet approved or  cleared by the [place] FDA and has been authorized for detection and/or diagnosis of SARS-CoV-2 by FDA under an Emergency Use Authorization (EUA).  This EUA will remain in effect (meaning this test can be used) for the duration of the COVID-19 declaration under Section 564(b)(1) of the Act, 21 U.S.C. section 360bbb-3(b)(1), unless the authorization is terminated or revoked sooner.  Performed at [facility], [address] [place], [place] [address]   Blood culture (routine x 2)     Status: None (Preliminary result)   Collection Time: 12/25/19  1:40 AM   Specimen: BLOOD LEFT ARM  Result  Value Ref Range Status   Specimen Description   Final    BLOOD LEFT ARM Performed at [facility], [address] [place], [place] [address]    Special Requests   Final    BOTTLES DRAWN AEROBIC AND ANAEROBIC Blood Culture adequate volume Performed at [facility], [address] [place], [place] [address]    Culture   Final    NO GROWTH 3 DAYS Performed at [facility], [place] [address], [place], [place] [address]    Report Status PENDING  Incomplete  Blood culture (routine x 2)     Status: None (Preliminary result)   Collection Time: 12/25/19  1:50 AM   Specimen: BLOOD RIGHT HAND  Result Value Ref Range Status   Specimen Description   Final    BLOOD RIGHT HAND Performed at [facility], [address] [place], [place] [address]    Special Requests   Final    BOTTLES DRAWN AEROBIC AND ANAEROBIC Blood Culture adequate volume Performed at [facility], [address] [place], [place] [address]    Culture   Final    NO GROWTH 3 DAYS Performed at [facility], [place] [address] [place], [place] [address]    Report Status PENDING  Incomplete  MRSA PCR Screening     Status: None   Collection Time: 12/25/19  6:40 AM   Specimen: Nasopharyngeal  Result Value Ref Range Status   MRSA by PCR NEGATIVE NEGATIVE Final    Comment:        The GeneXpert MRSA Assay (FDA approved for NASAL specimens only), is one component of a comprehensive MRSA colonization surveillance program. It is not intended to diagnose MRSA infection nor to guide or monitor treatment for MRSA infections. Performed at [facility], [place] [address], [place], [place] [address]   Culture, respiratory     Status: None (Preliminary result)   Collection Time: 12/26/19  8:18 AM   Specimen: Bronchoalveolar Lavage; Respiratory  Result Value Ref Range Status   Specimen Description BRONCHIAL ALVEOLAR LAVAGE  Final   Special Requests NONE  Final   Gram Stain    Final  RARE WBC PRESENT,BOTH PMN AND MONONUCLEAR NO ORGANISMS SEEN    Culture   Final    CULTURE REINCUBATED FOR BETTER GROWTH Performed at [facility], [place] [address] [place], [place] [address]    Report Status PENDING  Incomplete  Fungus Culture With Stain     Status: None (Preliminary result)   Collection Time: 12/26/19  8:18 AM   Specimen: Bronchial Alveolar Lavage  Result Value Ref Range Status   Fungus Stain Final report  Final    Comment: (NOTE) Performed At: [facility] [place], [place] [id number] Rush Farmer MD [id number]    Fungus (Mycology) Culture PENDING  Incomplete   Fungal Source BRONCHIAL ALVEOLAR LAVAGE  Final    Comment: Performed at [facility], [place] [address], [place], [place] [address]  Fungus Culture Result     Status: None   Collection Time: 12/26/19  8:18 AM  Result Value Ref Range Status   Result 1 Comment  Final    Comment: (NOTE) KOH/Calcofluor preparation:  no fungus observed. Performed At: [facility] [place], [place] [id number] Rush Farmer MD [id number]          Radiology Studies: No results found.      Scheduled Meds:  calcium acetate  1,334 mg Oral TID WC   [START ON 12/29/2019] calcium carbonate  1 tablet Oral Daily   Chlorhexidine Gluconate Cloth  6 each Topical Q0600   Chlorhexidine Gluconate Cloth  6 each Topical Q0600   cholecalciferol  2,000 Units Oral Daily   doxercalciferol  1 mcg Intravenous Q M,W,F-HD   famotidine  20 mg Oral Daily   folic acid  1 mg Oral Daily   multivitamin  1 tablet Oral QHS   predniSONE  20 mg Oral Q breakfast   rosuvastatin  10 mg Oral Daily   sodium chloride flush  3 mL Intravenous Q12H   tranexamic acid  500 mg Nebulization Q8H   warfarin  10 mg Oral ONCE-1600   Warfarin - Pharmacist Dosing Inpatient   Does not apply q1600   Continuous Infusions:  calcium gluconate 1,000 mg (12/28/19 1218)    heparin 650 Units/hr (12/28/19 0526)     LOS: 3 days    Time spent: 35 minutes.     Elmarie Shiley, MD [facility]   If 7PM-7AM, please contact night-coverage [email or web address]  12/28/2019, 12:40 PM

## 2019-12-28 NOTE — Progress Notes (Addendum)
[place] for heparin Indication: history of pulmonary embolus and DVT  Allergies  Allergen Reactions  . Cefazolin Anaphylaxis  . Ferrlecit [Na Ferric Gluc Cplx In Sucrose] Anaphylaxis    Anaphylactic shock/severe angioedema on 08/09/15 related to IV iron Ferrlecit IV)  required intubation for respiratory failure  . Cyclosporine Other (See Comments)    Loss of motor skills  . Lactose Intolerance (Gi) Nausea Only    Stomach pains also  . Latex Rash  . Vancomycin Rash    Patient Measurements: Height: 5\' 2"  (157.5 cm) Weight: 48.6 kg (107 lb 2.3 oz) IBW/kg (Calculated) : 50.1 Heparin Dosing Weight: 48.6 kg   Vital Signs: Temp: 97.9 F (36.6 C) (07/01 0351) Temp Source: Oral (07/01 0351) BP: 113/76 (07/01 0351) Pulse Rate: 64 (07/01 0351)  Labs: Recent Labs    12/26/19 0303 12/26/19 1817 12/27/19 1200 12/27/19 1632 12/28/19 0307  HGB 13.0  --  11.2*  --  11.1*  HCT 43.0  --  37.3  --  36.4  PLT 170  --  156  --  159  LABPROT  --   --  19.2*  --  17.7*  INR  --   --  1.7*  --  1.5*  HEPARINUNFRC  --    < > >2.20* 1.07* 0.64  CREATININE 6.76*  --  10.76*  --  6.98*   < > = values in this interval not displayed.    Estimated Creatinine Clearance: 9 mL/min (A) (by C-G formula based on SCr of 6.98 mg/dL (H)).  Assessment: 29 y.o. female with h/o PE/DVT for anticoagulation. Home Coumadin regimen is 7 mg daily  Goal of Therapy:  INR 2-3 Heparin level 0.3-0.5 units/ml Monitor platelets by anticoagulation protocol: Yes   Plan:  Decrease Heparin 650 units/hr Coumadin 10 mg today.  Phillis Knack, PharmD, BCPS

## 2019-12-28 NOTE — Plan of Care (Signed)
  Problem: Education: Goal: Knowledge of General Education information will improve Description: Including pain rating scale, medication(s)/side effects and non-pharmacologic comfort measures Outcome: Progressing   Problem: Health Behavior/Discharge Planning: Goal: Ability to manage health-related needs will improve Outcome: Progressing   Problem: Clinical Measurements: Goal: Diagnostic test results will improve Outcome: Progressing   Problem: Activity: Goal: Risk for activity intolerance will decrease Outcome: Progressing   Problem: Nutrition: Goal: Adequate nutrition will be maintained Outcome: Progressing   Problem: Coping: Goal: Level of anxiety will decrease Outcome: Progressing   Problem: Elimination: Goal: Will not experience complications related to bowel motility Outcome: Progressing   Problem: Pain Managment: Goal: General experience of comfort will improve Outcome: Progressing

## 2019-12-28 NOTE — Progress Notes (Addendum)
[place] for heparin Indication: history of pulmonary embolus and DVT  Allergies  Allergen Reactions  . Cefazolin Anaphylaxis  . Ferrlecit [Na Ferric Gluc Cplx In Sucrose] Anaphylaxis    Anaphylactic shock/severe angioedema on 08/09/15 related to IV iron Ferrlecit IV)  required intubation for respiratory failure  . Cyclosporine Other (See Comments)    Loss of motor skills  . Lactose Intolerance (Gi) Nausea Only    Stomach pains also  . Latex Rash  . Vancomycin Rash    Patient Measurements: Height: 5\' 2"  (157.5 cm) Weight: 48.6 kg (107 lb 2.3 oz) IBW/kg (Calculated) : 50.1 Heparin Dosing Weight: 48.6 kg   Vital Signs: Temp: 98.1 F (36.7 C) (07/01 1106) Temp Source: Oral (07/01 1106) BP: 123/96 (07/01 1106) Pulse Rate: 63 (07/01 1106)  Labs: Recent Labs    12/26/19 0303 12/26/19 1817 12/27/19 1200 12/27/19 1200 12/27/19 1632 12/28/19 0307 12/28/19 1359  HGB 13.0  --  11.2*  --   --  11.1*  --   HCT 43.0  --  37.3  --   --  36.4  --   PLT 170  --  156  --   --  159  --   LABPROT  --   --  19.2*  --   --  17.7*  --   INR  --   --  1.7*  --   --  1.5*  --   HEPARINUNFRC  --    < > >2.20*   < > 1.07* 0.64 0.43  CREATININE 6.76*  --  10.76*  --   --  6.98*  --    < > = values in this interval not displayed.    Estimated Creatinine Clearance: 9 mL/min (A) (by C-G formula based on SCr of 6.98 mg/dL (H)).  Assessment: 29 y.o. female with h/o PE/DVT for anticoagulation.   Home Coumadin regimen is 7 mg daily Heparin level is therapeutic at 0.43 on 650 units/hr INR is low 1.5  Goal of Therapy:  INR 2-3 Heparin level 0.3-0.5 units/ml Monitor platelets by anticoagulation protocol: Yes   Plan:  Continue Heparin 650 units/hr Coumadin 10 mg today.  Vertis Kelch, PharmD, BCPS Phone [phone] 12/28/2019       3:11 PM  Please check [email or web address] for unit-specific pharmacist phone numbers

## 2019-12-28 NOTE — Progress Notes (Signed)
[facility] Progress Note  Subjective: seen in room, no new c/o  Vitals:   12/28/19 0351 12/28/19 0530 12/28/19 0714 12/28/19 1106  BP: 113/76  (!) 126/100 (!) 123/96  Pulse: 64  64 63  Resp: 15  13 11   Temp: 97.9 F (36.6 C)  97.7 F (36.5 C) 98.1 F (36.7 C)  TempSrc: Oral  Oral Oral  SpO2: 93% 95% 92% 92%  Weight:      Height:        Exam:  alert, nad   no jvd  Chest cta bilat  Cor reg no RG  Abd soft ntnd no ascites   Ext no LE edema   Alert, NF, ox3   L IJ TDC    OP HD: [facility] MWF   3.5h  350/800  47kg  2/2.5 bath  P2  Hep 3000  TDC  hect 1 ug tiw    Assessment/Plan: 1. Hemoptysis. Hx PE/DVT on Coumadin. CTA no infiltrates, mild GG changes at bases. Bronchoscopy showed some bronchitis on the R side, started on pred 20mg  / d per CCM.  Did not tolerate extra fluid removal w/ HD Monday.  2. ESRD -  HD MWF. HD tomorrow. Has TDC.  3. Hypertension/volume  - BP's soft to low-normal here, dc'd  losartan. If restarting give lower dose of 25- 50mg  / d.  4. Anemia  - Hgb 11.9 Not on ESA. Follow trends.  5. Metabolic bone disease -  Ca lower today 6.8.  Will give 2 po Tums today and then 1 daily for 1 week, also getting IV 1 gm ca gluc today. Will dc renvela and resume pholso 2 ac tid since phos is in 2-4 range. Cont hect 1ug 6. Hx PE/DVT  - on Coumadin. INR 1.5, back on coumadin     Rob Demya Scruggs 12/28/2019, 12:05 PM   Recent Labs  Lab 12/27/19 1200 12/28/19 0307  K 4.5 4.5  BUN 41* 24*  CREATININE 10.76* 6.98*  CALCIUM 6.6* 6.5*  PHOS 4.5 3.6  HGB 11.2* 11.1*   Inpatient medications: . calcium acetate  1,334 mg Oral TID WC  . [START ON 12/29/2019] calcium carbonate  1 tablet Oral Daily  . calcium carbonate  2 tablet Oral Once  . Chlorhexidine Gluconate Cloth  6 each Topical Q0600  . Chlorhexidine Gluconate Cloth  6 each Topical Q0600  . cholecalciferol  2,000 Units Oral Daily  . doxercalciferol  1 mcg Intravenous Q M,W,F-HD  . famotidine  20 mg  Oral Daily  . folic acid  1 mg Oral Daily  . multivitamin  1 tablet Oral QHS  . predniSONE  20 mg Oral Q breakfast  . rosuvastatin  10 mg Oral Daily  . sodium chloride flush  3 mL Intravenous Q12H  . tranexamic acid  500 mg Nebulization Q8H  . warfarin  10 mg Oral ONCE-1600  . Warfarin - Pharmacist Dosing Inpatient   Does not apply q1600   . calcium gluconate    . heparin 650 Units/hr (12/28/19 0526)   acetaminophen **OR** acetaminophen, calcium carbonate (dosed in mg elemental calcium), camphor-menthol **AND** hydrOXYzine, docusate sodium, menthol-cetylpyridinium, ondansetron **OR** ondansetron (ZOFRAN) IV, sorbitol, traMADol, zolpidem

## 2019-12-28 NOTE — Progress Notes (Signed)
[facility] Progress Note  Subjective: seen in room, no new c/o  Vitals:   12/28/19 0351 12/28/19 0530 12/28/19 0714 12/28/19 1106  BP: 113/76  (!) 126/100 (!) 123/96  Pulse: 64  64 63  Resp: 15  13 11   Temp: 97.9 F (36.6 C)  97.7 F (36.5 C) 98.1 F (36.7 C)  TempSrc: Oral  Oral Oral  SpO2: 93% 95% 92% 92%  Weight:      Height:        Exam:  alert, nad   no jvd  Chest cta bilat  Cor reg no RG  Abd soft ntnd no ascites   Ext no LE edema   Alert, NF, ox3   L IJ TDC    OP HD: [facility] MWF   3.5h  350/800  47kg  2/2.5 bath  P2  Hep 3000  hect 1 ug tiw    Assessment/Plan: 1. Hemoptysis. Hx PE/DVT on Coumadin. CTA no infiltrates, mild GG changes at bases. Bronchoscopy showed some bronchitis on the R side, started on pred 20mg  / d per CCM.  Did not tolerate extra fluid removal w/ HD Monday.  2. ESRD -  HD MWF. HD today.  3. Hypertension/volume  - BP's soft to low-normal here, dc'd  losartan. If restarted give lower dose of 50mg  / d.  4. Anemia  - Hgb 11.9 Not on ESA. Follow trends.  5. Metabolic bone disease -  Ca low. Continue Ca acetate binder/Tums/Hectorol.  6. Hx PE/DVT  - on Coumadin. INR 1.9 on admission. Heparin/warfarin bridge per pharmacy      Rob [place] 12/28/2019, 11:56 AM   Recent Labs  Lab 12/27/19 1200 12/28/19 0307  K 4.5 4.5  BUN 41* 24*  CREATININE 10.76* 6.98*  CALCIUM 6.6* 6.5*  PHOS 4.5 3.6  HGB 11.2* 11.1*   Inpatient medications:  calcium acetate  1,334 mg Oral TID WC   [START ON 12/29/2019] calcium carbonate  1 tablet Oral Daily   calcium carbonate  2 tablet Oral Once   Chlorhexidine Gluconate Cloth  6 each Topical Q0600   Chlorhexidine Gluconate Cloth  6 each Topical Q0600   cholecalciferol  2,000 Units Oral Daily   doxercalciferol  1 mcg Intravenous Q M,W,F-HD   famotidine  20 mg Oral Daily   folic acid  1 mg Oral Daily   multivitamin  1 tablet Oral QHS   predniSONE  20 mg Oral Q breakfast   rosuvastatin   10 mg Oral Daily   sodium chloride flush  3 mL Intravenous Q12H   tranexamic acid  500 mg Nebulization Q8H   warfarin  10 mg Oral ONCE-1600   Warfarin - Pharmacist Dosing Inpatient   Does not apply q1600    calcium gluconate     heparin 650 Units/hr (12/28/19 0526)   acetaminophen **OR** acetaminophen, calcium carbonate (dosed in mg elemental calcium), camphor-menthol **AND** hydrOXYzine, docusate sodium, menthol-cetylpyridinium, ondansetron **OR** ondansetron (ZOFRAN) IV, sorbitol, traMADol, zolpidem

## 2019-12-29 ENCOUNTER — Encounter (HOSPITAL_COMMUNITY): Payer: Self-pay | Admitting: Internal Medicine

## 2019-12-29 ENCOUNTER — Other Ambulatory Visit: Payer: Self-pay

## 2019-12-29 MED ORDER — WARFARIN SODIUM 5 MG PO TABS
7.0000 mg | ORAL_TABLET | ORAL | Status: AC
  Administered 2019-12-29: 7 mg via ORAL
  Filled 2019-12-29: qty 1

## 2019-12-29 MED ORDER — HEPARIN SODIUM (PORCINE) 1000 UNIT/ML IJ SOLN
INTRAMUSCULAR | Status: AC
  Administered 2019-12-30: 1000 [IU]
  Filled 2019-12-29: qty 4

## 2019-12-29 MED ORDER — DIPHENHYDRAMINE HCL 50 MG/ML IJ SOLN
INTRAMUSCULAR | Status: AC
  Administered 2019-12-29: 50 mg
  Filled 2019-12-29: qty 1

## 2019-12-29 MED ORDER — HEPARIN SODIUM (PORCINE) 1000 UNIT/ML DIALYSIS: 1500.0000 [IU] | INTRAMUSCULAR | Status: DC | PRN

## 2019-12-29 MED ORDER — HYDROMORPHONE HCL 1 MG/ML IJ SOLN
0.5000 mg | Freq: Once | INTRAMUSCULAR | Status: AC
  Administered 2019-12-29: 0.5 mg via INTRAVENOUS
  Filled 2019-12-29: qty 0.5

## 2019-12-29 MED ORDER — SORBITOL 70 % SOLN
30.0000 mL | Freq: Once | Status: AC
  Administered 2019-12-29: 30 mL via ORAL
  Filled 2019-12-29: qty 30

## 2019-12-29 MED ORDER — SENNOSIDES-DOCUSATE SODIUM 8.6-50 MG PO TABS
1.0000 | ORAL_TABLET | Freq: Two times a day (BID) | ORAL | Status: DC
  Administered 2019-12-29 – 2020-01-02 (×7): 1 via ORAL
  Filled 2019-12-29 (×9): qty 1

## 2019-12-29 MED ORDER — HYDROCODONE-ACETAMINOPHEN 5-325 MG PO TABS
1.0000 | ORAL_TABLET | Freq: Once | ORAL | Status: AC
  Administered 2019-12-29: 1 via ORAL
  Filled 2019-12-29: qty 1

## 2019-12-29 NOTE — Progress Notes (Signed)
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  She reports coughing up a lot of blood started the day prior to admission, prior red blood.  She had a cold and had been coughing and thinks this is what led to it.  She report low-grade fever.  No GI symptoms, no sick contacts.  She was feeling well other than mild cough onto the day prior to admission.  She is on Coumadin and her INR fluctuates. Patient had a CT angiogram which was negative for PE with show obstruction of her SVC stent.  Dr. Doren Custard on-call vascular surgery advised no intervention at this time. Consulted, patient underwent bronchoscopy on 6/29.  Pulmonology think hemoptysis likely related to  Bronchitis.  Plan to continue with heparin and resume Coumadin today.  Assessment & Plan:   Principal Problem:   Hemoptysis Active Problems:   End stage renal disease on dialysis [facility])   Essential hypertension   History of pulmonary embolism   Occlusion of stent of peripheral artery   Dyslipidemia  1-Hemoptysis: Evaluated by CCM, underwent bronchoscopy cultures negative so far and  cytology negative. Pulmonologist think hemoptysis is related to acute bronchitis. Patient was started on Prednisone.  -Patient has significant pulmonary esophageal collateral with esophageal varices, with patient appears certain that this is about his rather than hematemesis Hb stable.   Hypocalcemia; replete IV>  Started on oral supplement.   ESRD on hemodialysis MWF: Received Lokelma for hyperkalemia.   Hyperkalemia: Resolved  SVC stent occlusion: Per EMR discussion with Dr. Doren Custard by EDP.  Appears to be subacute versus chronic in nature.  No surgical  intervention indicated at this time.  Hypotension: Resolved  Hyperlipidemia: Continue with rosuvastatin  History of PE: Patient need to be on anticoagulation indefinitely Continue with Heparin resume Coumadin. INR 1.5  Leukopenia:  B12 normal.  Abdominal pain; probably related to constipation. Sorbitol ordered.  Repeat sorbitol dose.   Estimated body mass index is 19.6 kg/m as calculated from the following:   Height as of this encounter: _0  (1.575 m).   Weight as of this encounter: 48.6 kg.   DVT prophylaxis: Heparin drip Code Status: Full code Family Communication: Care discussed with patient Disposition Plan:  Status is: Inpatient  Remains inpatient appropriate because:Hemodynamically unstable   Dispo: The patient is from: Home              Anticipated d/c is to: Home              Anticipated d/c date is: 2 days              Patient currently is not medically stable to d/c.  Plan to resume Coumadin today Home when INR therapeutic        Consultants:   CCM  Nephrology  Procedures:   HD  Bronchoscopy  Antimicrobials:    Subjective: Report improvement of abdominal pain  Denies hemoptysis.  Objective: Vitals:   12/28/19 2337 12/29/19 0404 12/29/19 0707 12/29/19 1120  BP: (!) 140/95 (!) 112/92 (!) 115/97   Pulse: (!) 56 70 66 62  Resp: _1 Temp: 98.5 F (36.9 C) 98.5 F (36.9 C) 98.6 F (37 C) 98.4 F (36.9 C)  TempSrc:  Oral Oral Oral Oral  SpO2: 93% 90% 94%   Weight:      Height:        Intake/Output Summary (Last 24 hours) at 12/29/2019 1413 Last data filed at 12/29/2019 0849 Gross per 24 hour  Intake 835.18 ml  Output --  Net 835.18 ml   Filed Weights   12/26/19 0711 12/27/19 1137 12/27/19 1455  Weight: 47.6 kg 49.7 kg 48.6 kg    Examination:  General exam: NAD Respiratory system: CTA Cardiovascular system: S 1, S 2 RRR Gastrointestinal system: BS present, soft  nt Central nervous system: Alert and  oriented Extremities: Symmetric power.    Data Reviewed: I have personally reviewed following labs and imaging studies  CBC: Recent Labs  Lab 12/24/19 2107 12/25/19 1008 12/26/19 0303 12/27/19 1200 12/28/19 0307  WBC 2.6*  --  2.9* 3.8* 2.8*  NEUTROABS 1.4*  --   --   --   --   HGB 12.5 11.9* 13.0 11.2* 11.1*  HCT 41.5 38.9 43.0 37.3 36.4  MCV 97.0  --  98.2 97.1 95.5  PLT 190  --  170 156 888   Basic Metabolic Panel: Recent Labs  Lab 12/24/19 2335 12/26/19 0303 12/27/19 1200 12/28/19 0307  NA 137 137 132* 136  K 6.4* 3.9 4.5 4.5  CL 98 99 97* 101  CO2 20* _0 GLUCOSE 94 59* 199* 93  BUN 61* 18 41* 24*  CREATININE 13.39* 6.76* 10.76* 6.98*  CALCIUM 7.3* 8.1* 6.6* 6.5*  MG  --   --  2.3 2.1  PHOS  --   --  4.5 3.6   GFR: Estimated Creatinine Clearance: 9 mL/min (A) (by C-G formula based on SCr of 6.98 mg/dL (H)). Liver Function Tests: Recent Labs  Lab 12/24/19 2335 12/27/19 1200 12/28/19 0307  AST _1 ALT _2 ALKPHOS 113 86 78  BILITOT 0.6 0.3 0.6  PROT 8.7* 7.2 6.8  ALBUMIN 4.6 3.6 3.6   Recent Labs  Lab 12/24/19 2107  LIPASE 63*   No results for input(s): AMMONIA in the last 168 hours. Coagulation Profile: Recent Labs  Lab 12/24/19 2107 12/27/19 1200 12/28/19 0307  INR 1.9* 1.7* 1.5*   Cardiac Enzymes: No results for input(s): CKTOTAL, CKMB, CKMBINDEX, TROPONINI in the last 168 hours. BNP (last 3 results) No results for input(s): PROBNP in the last 8760 hours. HbA1C: No results for input(s): HGBA1C in the last 72 hours. CBG: Recent Labs  Lab 12/26/19 0738 12/26/19 0842  GLUCAP 68* 73   Lipid Profile: Recent Labs    12/27/19 1200  CHOL 71  HDL 44  LDLCALC 15  TRIG 62  CHOLHDL 1.6   Thyroid Function Tests: No results for input(s): TSH, T4TOTAL, FREET4, T3FREE, THYROIDAB in the last 72 hours. Anemia Panel: Recent Labs    12/28/19 0307  VITAMINB12 1,463*   Sepsis Labs: No results for input(s):  PROCALCITON, LATICACIDVEN in the last 168 hours.  Recent Results (from the past 240 hour(s))  SARS Coronavirus 2 by RT PCR (hospital order, performed in [facility] hospital lab) Nasopharyngeal Nasopharyngeal Swab     Status: None   Collection Time: 12/24/19  9:12 PM   Specimen: Nasopharyngeal Swab  Result Value Ref Range Status   SARS Coronavirus 2 NEGATIVE NEGATIVE Final    Comment: (NOTE) SARS-CoV-2 target nucleic acids are NOT DETECTED.  The SARS-CoV-2 RNA is generally detectable in upper and lower respiratory specimens during the acute phase of infection.  The lowest concentration of SARS-CoV-2 viral copies this assay can detect is 250 copies / mL. A negative result does not preclude SARS-CoV-2 infection and should not be used as the sole basis for treatment or other patient management decisions.  A negative result may occur with improper specimen collection / handling, submission of specimen other than nasopharyngeal swab, presence of viral mutation(s) within the areas targeted by this assay, and inadequate number of viral copies (<250 copies / mL). A negative result must be combined with clinical observations, patient history, and epidemiological information.  Fact Sheet for Patients:   StrictlyIdeas.no  Fact Sheet for Healthcare Providers: BankingDealers.co.za  This test is not yet approved or  cleared by the [place] FDA and has been authorized for detection and/or diagnosis of SARS-CoV-2 by FDA under an Emergency Use Authorization (EUA).  This EUA will remain in effect (meaning this test can be used) for the duration of the COVID-19 declaration under Section 564(b)(1) of the Act, 21 U.S.C. section 360bbb-3(b)(1), unless the authorization is terminated or revoked sooner.  Performed at [facility], [address] [place], [place] [address]   Blood culture (routine x 2)     Status: None (Preliminary result)    Collection Time: 12/25/19  1:40 AM   Specimen: BLOOD LEFT ARM  Result Value Ref Range Status   Specimen Description   Final    BLOOD LEFT ARM Performed at [facility], [address] [place], [place] [address]    Special Requests   Final    BOTTLES DRAWN AEROBIC AND ANAEROBIC Blood Culture adequate volume Performed at [facility], [address] [place], [place] [address]    Culture   Final    NO GROWTH 4 DAYS Performed at [facility], [place] [address], [place], [place] [address]    Report Status PENDING  Incomplete  Blood culture (routine x 2)     Status: None (Preliminary result)   Collection Time: 12/25/19  1:50 AM   Specimen: BLOOD RIGHT HAND  Result Value Ref Range Status   Specimen Description   Final    BLOOD RIGHT HAND Performed at [facility], [address] [place], [place] [address]    Special Requests   Final    BOTTLES DRAWN AEROBIC AND ANAEROBIC Blood Culture adequate volume Performed at [facility], [address] [place], [place] [address]    Culture   Final    NO GROWTH 4 DAYS Performed at [facility], [place] [address], [place], [place] [address]    Report Status PENDING  Incomplete  MRSA PCR Screening     Status: None   Collection Time: 12/25/19  6:40 AM   Specimen: Nasopharyngeal  Result Value Ref Range Status   MRSA by PCR NEGATIVE NEGATIVE Final    Comment:        The GeneXpert MRSA Assay (FDA approved for NASAL specimens only), is one component of a comprehensive MRSA colonization surveillance program. It is not intended to diagnose MRSA infection nor to guide or monitor treatment for MRSA infections. Performed at [facility], [place] [address], [place], [place] [address]   Culture, respiratory     Status: None   Collection Time: 12/26/19  8:18 AM   Specimen: Bronchoalveolar Lavage; Respiratory  Result Value Ref Range Status   Specimen Description BRONCHIAL ALVEOLAR LAVAGE   Final   Special Requests NONE  Final   Gram Stain   Final    RARE WBC PRESENT,BOTH PMN AND MONONUCLEAR NO ORGANISMS SEEN    Culture   Final    FEW MORAXELLA CATARRHALIS(BRANHAMELLA) BETA LACTAMASE POSITIVE Performed at [facility], [address] [place], [place] [address]    Report Status 12/28/2019 FINAL  Final  Fungus Culture With Stain     Status: None (Preliminary result)   Collection Time: 12/26/19  8:18 AM   Specimen: Bronchial Alveolar Lavage  Result Value Ref Range Status   Fungus Stain Final report  Final    Comment: (NOTE) Performed At: [facility] [place], [place] [id number] Rush Farmer MD SJ:[id number]    Fungus (Mycology) Culture PENDING  Incomplete   Fungal Source BRONCHIAL ALVEOLAR LAVAGE  Final    Comment: Performed at [facility], [place] [address] [place],  [address]  Fungus Culture Result     Status: None   Collection Time: 12/26/19  8:18 AM  Result Value Ref Range Status   Result 1 Comment  Final    Comment: (NOTE) KOH/Calcofluor preparation:  no fungus observed. Performed At: [facility] [place], [place] [id number] Rush Farmer MD EX:[id number]          Radiology Studies: DG Abd 1 View  Result Date: 12/28/2019 CLINICAL DATA:  Left lower quadrant pain, history of SBO EXAM: ABDOMEN - 1 VIEW COMPARISON:  None. FINDINGS: Large volume high attenuation material within the colon, could reflect prior ingestion of bismuth containing products or inspissated stool within overall large colonic stool burden. No high-grade obstructive bowel gas pattern is seen however. Surgical clips noted in the left upper quadrant and left hemipelvis. Prior transcervical fixation of both femora. Mild levocurvature of the lumbar spine. IMPRESSION: 1. Large volume high attenuation material within the colon, could reflect prior ingestion of bismuth containing products or inspissated stool within an overall large  colonic stool burden. Correlate for features of constipation. 2. No high-grade obstructive bowel gas pattern. Electronically Signed   By: Lovena Le M.D.   On: 12/28/2019 20:10        Scheduled Meds: . calcium acetate  1,334 mg Oral TID WC  . calcium carbonate  1 tablet Oral Daily  . Chlorhexidine Gluconate Cloth  6 each Topical Q0600  . Chlorhexidine Gluconate Cloth  6 each Topical Q0600  . Chlorhexidine Gluconate Cloth  6 each Topical Q0600  . cholecalciferol  2,000 Units Oral Daily  . doxercalciferol  1 mcg Intravenous Q M,W,F-HD  . famotidine  20 mg Oral Daily  . folic acid  1 mg Oral Daily  . multivitamin  1 tablet Oral QHS  . predniSONE  20 mg Oral Q breakfast  . rosuvastatin  10 mg Oral Daily  . senna-docusate  1 tablet Oral BID  . sodium chloride flush  3 mL Intravenous Q12H  . Warfarin - Pharmacist Dosing Inpatient   Does not apply q1600   Continuous Infusions: . heparin 650 Units/hr (12/28/19 0526)     LOS: 4 days    Time spent: 35 minutes.     Elmarie Shiley, MD [facility]   If 7PM-7AM, please contact night-coverage [email or web address]  12/29/2019, 2:13 PM

## 2019-12-29 NOTE — Progress Notes (Addendum)
[facility] Progress Note  Subjective: seen in room, no c/o's, no further cough or hemoptysis. Awaiting Rx INR.   Vitals:   12/28/19 1937 12/28/19 2337 12/29/19 0404 12/29/19 0707  BP: (!) 128/97 (!) 140/95 (!) 112/92 (!) 115/97  Pulse: (!) 58 (!) 56 70 66  Resp: 16 14 15 12   Temp: 97.9 F (36.6 C) 98.5 F (36.9 C) 98.5 F (36.9 C) 98.6 F (37 C)  TempSrc: Oral Oral Oral Oral  SpO2: 99% 93% 90% 94%  Weight:      Height:        Exam:  alert, nad   no jvd  Chest cta bilat  Cor reg no RG  Abd soft ntnd no ascites   Ext no LE edema   Alert, NF, ox3   L IJ TDC    OP HD: [facility] MWF   3.5h  350/800  47kg  2/2.5 bath  P2  Hep 3000  TDC  hect 1 ug tiw    Assessment/Plan: 1. Hemoptysis/ bronchitis. Hx PE/DVT on Coumadin. CTA no infiltrates. Bronchoscopy showed bronchitic changes R side, started on pred 20mg  / d per CCM.  2. ESRD -  HD MWF. HD today.  3. Hypertension/volume  - BP's soft to low-normal here, dc'd  losartan. If restarting give lower dose of 25- 50mg  / d.  4. Anemia  - Hgb 11.9 Not on ESA. Follow trends.  5. Metabolic bone disease -  Ca low here 8.1 > 6.5. Gave extra IV and po Ca++ yesterday and resumed her phoslo which contains Ca++ as well.  Repeat labs pend today 6. Hx PE/DVT - back on coumadin, INR pending today, awaiting Rx INR for dc     Rob Xayden Linsey 12/29/2019, 10:08 AM   Recent Labs  Lab 12/27/19 1200 12/28/19 0307  K 4.5 4.5  BUN 41* 24*  CREATININE 10.76* 6.98*  CALCIUM 6.6* 6.5*  PHOS 4.5 3.6  HGB 11.2* 11.1*   Inpatient medications: . calcium acetate  1,334 mg Oral TID WC  . calcium carbonate  1 tablet Oral Daily  . Chlorhexidine Gluconate Cloth  6 each Topical Q0600  . Chlorhexidine Gluconate Cloth  6 each Topical Q0600  . Chlorhexidine Gluconate Cloth  6 each Topical Q0600  . cholecalciferol  2,000 Units Oral Daily  . doxercalciferol  1 mcg Intravenous Q M,W,F-HD  . famotidine  20 mg Oral Daily  . folic acid  1 mg Oral  Daily  . multivitamin  1 tablet Oral QHS  . predniSONE  20 mg Oral Q breakfast  . rosuvastatin  10 mg Oral Daily  . sodium chloride flush  3 mL Intravenous Q12H  . Warfarin - Pharmacist Dosing Inpatient   Does not apply q1600   . heparin 650 Units/hr (12/28/19 0526)   acetaminophen **OR** acetaminophen, calcium carbonate (dosed in mg elemental calcium), camphor-menthol **AND** hydrOXYzine, docusate sodium, menthol-cetylpyridinium, ondansetron **OR** ondansetron (ZOFRAN) IV, sorbitol, traMADol, zolpidem

## 2019-12-29 NOTE — Progress Notes (Signed)
[place] for heparin Indication: history of pulmonary embolus and DVT  Allergies  Allergen Reactions  . Cefazolin Anaphylaxis  . Ferrlecit [Na Ferric Gluc Cplx In Sucrose] Anaphylaxis    Anaphylactic shock/severe angioedema on 08/09/15 related to IV iron Ferrlecit IV)  required intubation for respiratory failure  . Cyclosporine Other (See Comments)    Loss of motor skills  . Lactose Intolerance (Gi) Nausea Only    Stomach pains also  . Latex Rash  . Vancomycin Rash    Patient Measurements: Height: 5\' 2"  (157.5 cm) Weight: 48.6 kg (107 lb 2.3 oz) IBW/kg (Calculated) : 50.1 Heparin Dosing Weight: 48.6 kg   Vital Signs: Temp: 98.5 F (36.9 C) (07/02 1933) Temp Source: Oral (07/02 1933) BP: 147/102 (07/02 1933) Pulse Rate: 58 (07/02 1933)  Labs: Recent Labs    12/27/19 1200 12/27/19 1200 12/27/19 1632 12/28/19 0307 12/28/19 1359  HGB 11.2*  --   --  11.1*  --   HCT 37.3  --   --  36.4  --   PLT 156  --   --  159  --   LABPROT 19.2*  --   --  17.7*  --   INR 1.7*  --   --  1.5*  --   HEPARINUNFRC >2.20*   < > 1.07* 0.64 0.43  CREATININE 10.76*  --   --  6.98*  --    < > = values in this interval not displayed.    Estimated Creatinine Clearance: 9 mL/min (A) (by C-G formula based on SCr of 6.98 mg/dL (H)).  Assessment: 29 y.o. female with h/o PE/DVT for anticoagulation.   Home Coumadin regimen is 7 mg daily  Pt refused labs today (7/2) so unable to assess INR and heparin level. Will give pt home dose of 7mg  tonight and check INR tomorrow. Continue current heparin rate and will attempt to get heparin level in HD.  Goal of Therapy:  INR 2-3 Heparin level 0.3-0.5 units/ml Monitor platelets by anticoagulation protocol: Yes   Plan:  -Warfarin 7mg  PO x1 now -Continue heparin 650 units/h for now -Check heparin level with HD if able  Arrie Senate, PharmD, BCPS Clinical Pharmacist [phone] Please check AMION for all [place] numbers 12/29/2019

## 2019-12-29 NOTE — Progress Notes (Addendum)
Patient c/o left lower abdominal pain, rating 8 out of 10. This RN offered patient PRN Tylenol or Tramadol. Patient refused both stating that neither provided her with pain relief earlier in the day. She requested another dose of Dilaudid. This RN educated the patient that Dilaudid will further contribute to her constipation. RN assisted patient to the bathroom in an attempt to have a bowl movement. Sorbitol was given earlier in the evening for constipation. Pt states she had a very small bowel movement, but pain was still present. Nausea also present once patient assisted back to bed. PRN Zofran was given. Patient asked if doctor could still be paged for Dilaudid. RN paged [facility] MD for a one-time dose of Dilaudid. Will continue to monitor.

## 2019-12-29 NOTE — Progress Notes (Signed)
RN notified by lab tech that patient was requesting to have labs drawn in HD. RN advise lab tech pt is going to HD at some point today not sure when. Lab tech tried again to drawn labs patient refused to let patient draw.

## 2019-12-30 ENCOUNTER — Inpatient Hospital Stay (HOSPITAL_COMMUNITY): Payer: Medicare Other

## 2019-12-30 LAB — HEPARIN LEVEL (UNFRACTIONATED): Heparin Unfractionated: 0.32 IU/mL (ref 0.30–0.70)

## 2019-12-30 LAB — CBC
HCT: 36.4 % (ref 36.0–46.0)
Hemoglobin: 10.9 g/dL — ABNORMAL LOW (ref 12.0–15.0)
MCH: 28.8 pg (ref 26.0–34.0)
MCHC: 29.9 g/dL — ABNORMAL LOW (ref 30.0–36.0)
MCV: 96.3 fL (ref 80.0–100.0)
Platelets: 148 10*3/uL — ABNORMAL LOW (ref 150–400)
RBC: 3.78 MIL/uL — ABNORMAL LOW (ref 3.87–5.11)
RDW: 15.5 % (ref 11.5–15.5)
WBC: 2.8 10*3/uL — ABNORMAL LOW (ref 4.0–10.5)
nRBC: 0 % (ref 0.0–0.2)

## 2019-12-30 LAB — COMPREHENSIVE METABOLIC PANEL
ALT: 15 U/L (ref 0–44)
AST: 26 U/L (ref 15–41)
Albumin: 3.7 g/dL (ref 3.5–5.0)
Alkaline Phosphatase: 83 U/L (ref 38–126)
Anion gap: 12 (ref 5–15)
BUN: 38 mg/dL — ABNORMAL HIGH (ref 6–20)
CO2: 23 mmol/L (ref 22–32)
Calcium: 8.2 mg/dL — ABNORMAL LOW (ref 8.9–10.3)
Chloride: 98 mmol/L (ref 98–111)
Creatinine, Ser: 10.61 mg/dL — ABNORMAL HIGH (ref 0.44–1.00)
GFR calc Af Amer: 5 mL/min — ABNORMAL LOW (ref >60)
GFR calc non Af Amer: 4 mL/min — ABNORMAL LOW (ref >60)
Glucose, Bld: 99 mg/dL (ref 70–99)
Potassium: 5.3 mmol/L — ABNORMAL HIGH (ref 3.5–5.1)
Sodium: 133 mmol/L — ABNORMAL LOW (ref 135–145)
Total Bilirubin: 0.6 mg/dL (ref 0.3–1.2)
Total Protein: 6.9 g/dL (ref 6.5–8.1)

## 2019-12-30 LAB — CULTURE, BLOOD (ROUTINE X 2)
Culture: NO GROWTH
Culture: NO GROWTH
Special Requests: ADEQUATE
Special Requests: ADEQUATE

## 2019-12-30 LAB — PROTIME-INR
INR: 2 — ABNORMAL HIGH (ref 0.8–1.2)
Prothrombin Time: 21.9 seconds — ABNORMAL HIGH (ref 11.4–15.2)

## 2019-12-30 LAB — MAGNESIUM: Magnesium: 2.5 mg/dL — ABNORMAL HIGH (ref 1.7–2.4)

## 2019-12-30 LAB — PHOSPHORUS: Phosphorus: 4.1 mg/dL (ref 2.5–4.6)

## 2019-12-30 MED ORDER — HYDROMORPHONE HCL 1 MG/ML IJ SOLN
0.5000 mg | Freq: Once | INTRAMUSCULAR | Status: AC
  Administered 2019-12-30: 0.5 mg via INTRAVENOUS
  Filled 2019-12-30: qty 0.5

## 2019-12-30 MED ORDER — HYDROCODONE-ACETAMINOPHEN 5-325 MG PO TABS
1.0000 | ORAL_TABLET | Freq: Four times a day (QID) | ORAL | Status: DC | PRN
  Administered 2019-12-30 – 2020-01-02 (×6): 1 via ORAL
  Filled 2019-12-30 (×6): qty 1

## 2019-12-30 MED ORDER — IOHEXOL 9 MG/ML PO SOLN
500.0000 mL | ORAL | Status: AC
  Administered 2019-12-30 (×2): 500 mL via ORAL

## 2019-12-30 MED ORDER — DICLOFENAC SODIUM 1 % EX GEL
2.0000 g | Freq: Four times a day (QID) | CUTANEOUS | Status: DC
  Administered 2019-12-30 (×2): 2 g via TOPICAL
  Filled 2019-12-30: qty 100

## 2019-12-30 MED ORDER — HYDROMORPHONE HCL 1 MG/ML IJ SOLN
0.5000 mg | INTRAMUSCULAR | Status: AC | PRN
  Administered 2019-12-30 – 2019-12-31 (×2): 0.5 mg via INTRAVENOUS
  Filled 2019-12-30 (×2): qty 0.5

## 2019-12-30 MED ORDER — HYDROMORPHONE HCL 1 MG/ML IJ SOLN
INTRAMUSCULAR | Status: AC
  Administered 2019-12-30: 1 mg
  Filled 2019-12-30: qty 0.5

## 2019-12-30 MED ORDER — HYDROMORPHONE HCL 1 MG/ML IJ SOLN
INTRAMUSCULAR | Status: AC
  Filled 2019-12-30: qty 0.5

## 2019-12-30 MED ORDER — WARFARIN SODIUM 5 MG PO TABS
7.0000 mg | ORAL_TABLET | Freq: Once | ORAL | Status: AC
  Administered 2019-12-30: 7 mg via ORAL
  Filled 2019-12-30: qty 1

## 2019-12-30 NOTE — Progress Notes (Signed)
Heparin lab was not drawn in HD. Rn called HD to make sure they knew to draw lab. When pt returned to floor RN called to get heparin lab drawn. No results for heparin. RN called lab and was informed lab was not drawn. RN asked to have lab drawn by phlebotomy. Will continue to monitor.

## 2019-12-30 NOTE — Progress Notes (Signed)
[place] for heparin Indication: history of pulmonary embolus and DVT  Allergies  Allergen Reactions  . Cefazolin Anaphylaxis  . Ferrlecit [Na Ferric Gluc Cplx In Sucrose] Anaphylaxis    Anaphylactic shock/severe angioedema on 08/09/15 related to IV iron Ferrlecit IV)  required intubation for respiratory failure  . Cyclosporine Other (See Comments)    Loss of motor skills  . Lactose Intolerance (Gi) Nausea Only    Stomach pains also  . Latex Rash  . Vancomycin Rash    Patient Measurements: Height: 5\' 2"  (157.5 cm) Weight: 48.6 kg (107 lb 2.3 oz) IBW/kg (Calculated) : 50.1 Heparin Dosing Weight: 48.6 kg   Vital Signs: Temp: 98.7 F (37.1 C) (07/03 1148) Temp Source: Oral (07/03 1148) BP: 138/100 (07/03 1148) Pulse Rate: 57 (07/03 1148)  Labs: Recent Labs    12/28/19 0307 12/28/19 1359 12/29/19 2320 12/30/19 1130  HGB 11.1*  --  10.9*  --   HCT 36.4  --  36.4  --   PLT 159  --  148*  --   LABPROT 17.7*  --   --  21.9*  INR 1.5*  --   --  2.0*  HEPARINUNFRC 0.64 0.43  --  0.32  CREATININE 6.98*  --  10.61*  --     Estimated Creatinine Clearance: 5.9 mL/min (A) (by C-G formula based on SCr of 10.61 mg/dL (H)).  Assessment: 29 y.o. female with h/o PE/DVT for anticoagulation.   Home Coumadin regimen is 7 mg daily  Pt refused labs earlier today - able to collect mid-day. Heparin level was therapeutic at 0.32, on 650 units/hr. INR is therapeutic at 2. Discussed with MD and okay to stop heparin and continue warfarin given results and difficulty with consistent lab draws. No s/sx of bleeding.   so unable to assess INR and heparin level.   Goal of Therapy:  INR 2-3 Heparin level 0.3-0.5 units/ml Monitor platelets by anticoagulation protocol: Yes   Plan:  -Warfarin 7mg  PO x1 now -Discontinue heparin infusion  -Monitor daily INR, CBC, and for s/sx of bleeding   Antonietta Jewel, PharmD, [place] Pharmacist  Phone:  [phone] 12/30/2019 12:50 PM  Please check AMION for all [place] phone numbers After 10:00 PM, call [place] [phone]

## 2019-12-30 NOTE — Progress Notes (Signed)
Patient c/o pain on Rt. Groin, throbbing pain. Applied K-pad (heating) for pain, but patient said that it didn't help much. Diclofenac cream didn't help either and she refused to put on. She was crying and at times screaming or moaning. patient asked IV pain medication because po pain medication is not working for her. At the same time c/o nausea. Administered Zofran 4 mg IVP. Notified Dr. Tyrell Antonio regarding this matter. Ordered dilaudid 0.5 mg via IV once and CT of abd & pelvis. Explained patient regarding this. She understood it. HS [employer]

## 2019-12-30 NOTE — Progress Notes (Signed)
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  She reports coughing up a lot of blood started the day prior to admission, prior red blood.  She had a cold and had been coughing and thinks this is what led to it.  She report low-grade fever.  No GI symptoms, no sick contacts.  She was feeling well other than mild cough onto the day prior to admission.  She is on Coumadin and her INR fluctuates. Patient had a CT angiogram which was negative for PE with show obstruction of her SVC stent.  Dr. Doren Custard on-call vascular surgery advised no intervention at this time. Consulted, patient underwent bronchoscopy on 6/29.  Pulmonology think hemoptysis likely related to  Bronchitis.  Plan to continue with heparin and resume Coumadin today.  Assessment & Plan:   Principal Problem:   Hemoptysis Active Problems:   End stage renal disease on dialysis [facility])   Essential hypertension   History of pulmonary embolism   Occlusion of stent of peripheral artery   Dyslipidemia  1-Hemoptysis: Evaluated by CCM, underwent bronchoscopy cultures negative so far and  cytology negative. Pulmonologist think hemoptysis is related to acute bronchitis. Patient was started on Prednisone.  -Patient has significant pulmonary esophageal collateral with esophageal varices, with patient appears certain that this is about his rather than hematemesis Hb stable.   Hypocalcemia; replaced. Resolved.  Started on oral supplement.   ESRD on hemodialysis MWF: Received Lokelma for hyperkalemia.   Hyperkalemia: Resolved  SVC stent occlusion: Per EMR discussion with Dr. Doren Custard by EDP.  Appears to be subacute versus chronic in nature.  No surgical  intervention indicated at this time.  Hypotension: Resolved  Hyperlipidemia: Continue with rosuvastatin  History of PE: Patient need to be on anticoagulation indefinitely Continue with Heparin resume Coumadin. INR pending for today.   Leukopenia:  B12 normal.  Left side Abdominal pain; probably related to constipation. Sorbitol ordered.  Pain resolved after BM.   Right hip pain; check x ray, voltaren gel, change tramadol to vicodin.    Estimated body mass index is 19.6 kg/m as calculated from the following:   Height as of this encounter: _0  (1.575 m).   Weight as of this encounter: 48.6 kg.   DVT prophylaxis: Heparin drip Code Status: Full code Family Communication: Care discussed with patient and Mother who was at bedside.  Disposition Plan:  Status is: Inpatient  Remains inpatient appropriate because:Hemodynamically unstable   Dispo: The patient is from: Home              Anticipated d/c is to: Home              Anticipated d/c date is: 2 days              Patient currently is not medically stable to d/c.  Plan to resume Coumadin today Home when INR therapeutic        Consultants:   CCM  Nephrology  Procedures:   HD  Bronchoscopy  Antimicrobials:    Subjective: Left side abdominal pain resolved.  Complaining today of right side hip pain, throbbing.   Objective: Vitals:   12/30/19 0212 12/30/19 0230 12/30/19 0314 12/30/19 0811  BP: (!) 143/85 (!) 147/99 Marland Kitchen)  124/94 115/80  Pulse: (!) 41 (!) 58 61 91  Resp:  _0 Temp:  97.8 F (36.6 C) 98.1 F (36.7 C) 99 F (37.2 C)  TempSrc:  Oral Oral Oral  SpO2:    94%  Weight:      Height:        Intake/Output Summary (Last 24 hours) at 12/30/2019 0848 Last data filed at 12/30/2019 9741 Gross per 24 hour  Intake 609.15 ml  Output 4058 ml  Net -3448.85 ml   Filed Weights   12/26/19 0711 12/27/19 1137 12/27/19 1455  Weight: 47.6 kg 49.7 kg 48.6 kg    Examination:  General exam:  NAD Respiratory system: CTA Cardiovascular system: S 1, S 2 RRR Gastrointestinal system: BS present, soft, nt Central nervous system: Aalert Extremities: Symmetric power.    Data Reviewed: I have personally reviewed following labs and imaging studies  CBC: Recent Labs  Lab 12/24/19 2107 12/24/19 2107 12/25/19 1008 12/26/19 0303 12/27/19 1200 12/28/19 0307 12/29/19 2320  WBC 2.6*  --   --  2.9* 3.8* 2.8* 2.8*  NEUTROABS 1.4*  --   --   --   --   --   --   HGB 12.5   < > 11.9* 13.0 11.2* 11.1* 10.9*  HCT 41.5   < > 38.9 43.0 37.3 36.4 36.4  MCV 97.0  --   --  98.2 97.1 95.5 96.3  PLT 190  --   --  170 156 159 148*   < > = values in this interval not displayed.   Basic Metabolic Panel: Recent Labs  Lab 12/24/19 2335 12/26/19 0303 12/27/19 1200 12/28/19 0307 12/29/19 2320  NA 137 137 132* 136 133*  K 6.4* 3.9 4.5 4.5 5.3*  CL 98 99 97* 101 98  CO2 20* _1 GLUCOSE 94 59* 199* 93 99  BUN 61* 18 41* 24* 38*  CREATININE 13.39* 6.76* 10.76* 6.98* 10.61*  CALCIUM 7.3* 8.1* 6.6* 6.5* 8.2*  MG  --   --  2.3 2.1 2.5*  PHOS  --   --  4.5 3.6 4.1   GFR: Estimated Creatinine Clearance: 5.9 mL/min (A) (by C-G formula based on SCr of 10.61 mg/dL (H)). Liver Function Tests: Recent Labs  Lab 12/24/19 2335 12/27/19 1200 12/28/19 0307 12/29/19 2320  AST _2 ALT _3 ALKPHOS 113 86 78 83  BILITOT 0.6 0.3 0.6 0.6  PROT 8.7* 7.2 6.8 6.9  ALBUMIN 4.6 3.6 3.6 3.7   Recent Labs  Lab 12/24/19 2107  LIPASE 63*   No results for input(s): AMMONIA in the last 168 hours. Coagulation Profile: Recent Labs  Lab 12/24/19 2107 12/27/19 1200 12/28/19 0307  INR 1.9* 1.7* 1.5*   Cardiac Enzymes: No results for input(s): CKTOTAL, CKMB, CKMBINDEX, TROPONINI in the last 168 hours. BNP (last 3 results) No results for input(s): PROBNP in the last 8760 hours. HbA1C: No results for input(s): HGBA1C in the last 72 hours. CBG: Recent Labs  Lab  12/26/19 0738 12/26/19 0842  GLUCAP 68* 73   Lipid Profile: Recent Labs    12/27/19 1200  CHOL 71  HDL 44  LDLCALC 15  TRIG 62  CHOLHDL 1.6   Thyroid Function Tests: No results for input(s): TSH, T4TOTAL, FREET4, T3FREE, THYROIDAB in the last 72 hours. Anemia Panel: Recent Labs    12/28/19 0307  VITAMINB12 1,463*   Sepsis Labs: No results for input(s): PROCALCITON, LATICACIDVEN in  the last 168 hours.  Recent Results (from the past 240 hour(s))  SARS Coronavirus 2 by RT PCR (hospital order, performed in [facility] hospital lab) Nasopharyngeal Nasopharyngeal Swab     Status: None   Collection Time: 12/24/19  9:12 PM   Specimen: Nasopharyngeal Swab  Result Value Ref Range Status   SARS Coronavirus 2 NEGATIVE NEGATIVE Final    Comment: (NOTE) SARS-CoV-2 target nucleic acids are NOT DETECTED.  The SARS-CoV-2 RNA is generally detectable in upper and lower respiratory specimens during the acute phase of infection. The lowest concentration of SARS-CoV-2 viral copies this assay can detect is 250 copies / mL. A negative result does not preclude SARS-CoV-2 infection and should not be used as the sole basis for treatment or other patient management decisions.  A negative result may occur with improper specimen collection / handling, submission of specimen other than nasopharyngeal swab, presence of viral mutation(s) within the areas targeted by this assay, and inadequate number of viral copies (<250 copies / mL). A negative result must be combined with clinical observations, patient history, and epidemiological information.  Fact Sheet for Patients:   StrictlyIdeas.no  Fact Sheet for Healthcare Providers: BankingDealers.co.za  This test is not yet approved or  cleared by the [place] FDA and has been authorized for detection and/or diagnosis of SARS-CoV-2 by FDA under an Emergency Use Authorization (EUA).  This EUA will  remain in effect (meaning this test can be used) for the duration of the COVID-19 declaration under Section 564(b)(1) of the Act, 21 U.S.C. section 360bbb-3(b)(1), unless the authorization is terminated or revoked sooner.  Performed at [facility], [address] [place], [place] [address]   Blood culture (routine x 2)     Status: None (Preliminary result)   Collection Time: 12/25/19  1:40 AM   Specimen: BLOOD LEFT ARM  Result Value Ref Range Status   Specimen Description   Final    BLOOD LEFT ARM Performed at [facility], [address] [place], [place] [address]    Special Requests   Final    BOTTLES DRAWN AEROBIC AND ANAEROBIC Blood Culture adequate volume Performed at [facility], [address] [place], [place] [address]    Culture   Final    NO GROWTH 4 DAYS Performed at [facility], [place] [address], [place], [place] [address]    Report Status PENDING  Incomplete  Blood culture (routine x 2)     Status: None (Preliminary result)   Collection Time: 12/25/19  1:50 AM   Specimen: BLOOD RIGHT HAND  Result Value Ref Range Status   Specimen Description   Final    BLOOD RIGHT HAND Performed at [facility], [address] [place], [place] [address]    Special Requests   Final    BOTTLES DRAWN AEROBIC AND ANAEROBIC Blood Culture adequate volume Performed at [facility], [address] [place], [place] [address]    Culture   Final    NO GROWTH 4 DAYS Performed at [facility], [place] [address], [place], [place] [address]    Report Status PENDING  Incomplete  MRSA PCR Screening     Status: None   Collection Time: 12/25/19  6:40 AM   Specimen: Nasopharyngeal  Result Value Ref Range Status   MRSA by PCR NEGATIVE NEGATIVE Final    Comment:        The GeneXpert  MRSA Assay (FDA approved for NASAL specimens only), is one component of a comprehensive MRSA colonization surveillance program. It is  not intended to diagnose MRSA infection nor to guide or monitor treatment for MRSA infections. Performed at [facility], [place] [address], [place], [place] [address]   Culture, respiratory     Status: None   Collection Time: 12/26/19  8:18 AM   Specimen: Bronchoalveolar Lavage; Respiratory  Result Value Ref Range Status   Specimen Description BRONCHIAL ALVEOLAR LAVAGE  Final   Special Requests NONE  Final   Gram Stain   Final    RARE WBC PRESENT,BOTH PMN AND MONONUCLEAR NO ORGANISMS SEEN    Culture   Final    FEW MORAXELLA CATARRHALIS(BRANHAMELLA) BETA LACTAMASE POSITIVE Performed at [facility], [place] [address] [place], [place] [address]    Report Status 12/28/2019 FINAL  Final  Fungus Culture With Stain     Status: None (Preliminary result)   Collection Time: 12/26/19  8:18 AM   Specimen: Bronchial Alveolar Lavage  Result Value Ref Range Status   Fungus Stain Final report  Final    Comment: (NOTE) Performed At: [facility] [place], [place] [id number] Rush Farmer MD NU:[id number]    Fungus (Mycology) Culture PENDING  Incomplete   Fungal Source BRONCHIAL ALVEOLAR LAVAGE  Final    Comment: Performed at [facility], [place] [address], [place], [place] [address]  Fungus Culture Result     Status: None   Collection Time: 12/26/19  8:18 AM  Result Value Ref Range Status   Result 1 Comment  Final    Comment: (NOTE) KOH/Calcofluor preparation:  no fungus observed. Performed At: [facility] [place], [place] [id number] Rush Farmer MD EP:[id number]          Radiology Studies: DG Abd 1 View  Result Date: 12/28/2019 CLINICAL DATA:  Left lower quadrant pain, history of SBO EXAM: ABDOMEN - 1 VIEW COMPARISON:  None. FINDINGS: Large volume high attenuation material within the colon, could reflect prior ingestion of bismuth containing products or inspissated stool within overall large colonic stool burden. No  high-grade obstructive bowel gas pattern is seen however. Surgical clips noted in the left upper quadrant and left hemipelvis. Prior transcervical fixation of both femora. Mild levocurvature of the lumbar spine. IMPRESSION: 1. Large volume high attenuation material within the colon, could reflect prior ingestion of bismuth containing products or inspissated stool within an overall large colonic stool burden. Correlate for features of constipation. 2. No high-grade obstructive bowel gas pattern. Electronically Signed   By: Lovena Le M.D.   On: 12/28/2019 20:10        Scheduled Meds: . calcium acetate  1,334 mg Oral TID WC  . calcium carbonate  1 tablet Oral Daily  . Chlorhexidine Gluconate Cloth  6 each Topical Q0600  . Chlorhexidine Gluconate Cloth  6 each Topical Q0600  . Chlorhexidine Gluconate Cloth  6 each Topical Q0600  . cholecalciferol  2,000 Units Oral Daily  . doxercalciferol  1 mcg Intravenous Q M,W,F-HD  . famotidine  20 mg Oral Daily  . folic acid  1 mg Oral Daily  . HYDROmorphone      . multivitamin  1 tablet Oral QHS  . predniSONE  20 mg Oral Q breakfast  . rosuvastatin  10 mg Oral Daily  . senna-docusate  1 tablet Oral BID  . sodium chloride flush  3 mL Intravenous Q12H  . Warfarin -  Pharmacist Dosing Inpatient   Does not apply q1600   Continuous Infusions: . heparin 650 Units/hr (12/30/19 0300)     LOS: 5 days    Time spent: 35 minutes.     Elmarie Shiley, MD [facility]   If 7PM-7AM, please contact night-coverage [email or web address]  12/30/2019, 8:48 AM

## 2019-12-30 NOTE — Progress Notes (Addendum)
I received call from Radiology about abnormal CT scan of pelvis it is positive for bilateral intramuscular hematoma please review report for details. . I paged MD on call call made her aware and ask her to review CT report and call me with any new orders I need to carry out. I also notified pharmacy made them aware. Patient continues to c/o bilateral groin pain. I will continue to monitor.

## 2019-12-30 NOTE — Progress Notes (Signed)
   12/30/19 0230  Hand-Off documentation  Handoff Given Given to shift RN/LPN  Report given to (Full Name) Daiva Eves, RN  Handoff Received Received from shift RN/LPN  Report received from (Full Name) Yaacov Koziol  Vital Signs  Temp 97.8 F (36.6 C)  Temp Source Oral  Pulse Rate (!) 58  Resp 14  BP (!) 147/99  BP Location Right Arm  BP Method Automatic  Patient Position (if appropriate) Lying  Pain Assessment  Pain Scale 0-10  Pain Score 4  Pain Type Acute pain  Pain Location Abdomen  Pain Orientation Right;Left  Pain Descriptors / Indicators Aching  Pain Frequency Intermittent  Post-Hemodialysis Assessment  Rinseback Volume (mL) 250 mL  KECN 234 V  Dialyzer Clearance Lightly streaked  Duration of HD Treatment -hour(s) 3.5 hour(s)  Hemodialysis Intake (mL) 500 mL  UF Total -Machine (mL) 2528 mL  Net UF (mL) 2028 mL  Tolerated HD Treatment Yes  Post-Hemodialysis Comments pt tolerated the HD tx very well, stable, and complaints/concerns.  AVG/AVF Arterial Site Held (minutes) 0 minutes  AVG/AVF Venous Site Held (minutes) 0 minutes  Education / Care Plan  Dialysis Education Provided Yes  Hemodialysis Catheter Left Subclavian Double-lumen  No Placement Date or Time found.   Placed prior to admission: Yes  Orientation: Left  Access Location: Subclavian  Hemodialysis Catheter Type: Double-lumen  Site Condition No complications  Blue Lumen Status Heparin locked  Red Lumen Status Heparin locked;Capped (Central line)  Catheter fill solution Heparin 1000 units/ml  Catheter fill volume (Arterial) 1.5 cc  Catheter fill volume (Venous) 1.5  Dressing Type Occlusive  Dressing Status Clean;Dry;Intact

## 2019-12-30 NOTE — Progress Notes (Addendum)
[facility] Progress Note  Subjective: seen in room, c/o severe R groin/ ant thigh pain, no radiation, not tender, onset early this am.  Crystal Dunlap w/ PT yesterday, had HD w/ 4L off yest, not sure that is correct though  Vitals:   12/30/19 0212 12/30/19 0230 12/30/19 0314 12/30/19 0811  BP: (!) 143/85 (!) 147/99 (!) 124/94 115/80  Pulse: (!) 41 (!) 58 61 91  Resp:  14 12 13   Temp:  97.8 F (36.6 C) 98.1 F (36.7 C) 99 F (37.2 C)  TempSrc:  Oral Oral Oral  SpO2:    94%  Weight:      Height:        Exam:  alert, nad   no jvd  Chest cta bilat  Cor reg no RG  Abd soft ntnd no ascites   Ext no LE edema, R groin area nontender, no mass or erythema   Alert, NF, ox3   L IJ TDC    OP HD: HP MWF   3.5h  350/800  47kg  2/2.5 bath  P2  Hep 3000  TDC  hect 1 ug tiw    Assessment/Plan: 1. Hemoptysis/ bronchitis. Hx PE/DVT on Coumadin. CTA no infiltrates. Bronchoscopy showed bronchitic changes R side, started on pred 20mg  / d per CCM.  SVC syndrome has been reported to possibly cause hemoptysis due to dilatation of bronchial veins.  2. ESRD -  HD MWF. 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 ESRD since age 29 in 97. HD today.  3. Vasc access: seen by Dr Oneida Alar in 2016 and RUA AVG was suggested but pt refused permanent access at that time and appears she has been cath-dependent since.  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 This is from CTA chest report. Not sure where SVC stent was placed.  5. Hypertension/volume  - BP's soft to low-normal here, dc'd  losartan. If restarting give lower dose of 25- 50mg  / d.  6. Anemia  - Hgb 11.9 Not on ESA. Follow trends.  7. Metabolic bone disease -  Ca low here 8.1 > 6.5, was given IV/po Ca and Ca++ improved to > 8 on 7.2. Switched renvela to phoslo (was taking both at  home per records?).  8. Hx PE/DVT - back on coumadin, INR pending      Crystal Dunlap 12/30/2019, 11:41 AM   Recent Labs  Lab 12/28/19 0307 12/29/19 2320  K 4.5 5.3*  BUN 24* 38*  CREATININE 6.98* 10.61*  CALCIUM 6.5* 8.2*  PHOS 3.6 4.1  HGB 11.1* 10.9*   Inpatient medications: . calcium acetate  1,334 mg Oral TID WC  . calcium carbonate  1 tablet Oral Daily  . Chlorhexidine Gluconate Cloth  6 each Topical Q0600  . Chlorhexidine Gluconate Cloth  6 each Topical Q0600  . Chlorhexidine Gluconate Cloth  6 each Topical Q0600  . cholecalciferol  2,000 Units Oral Daily  . diclofenac Sodium  2 g Topical QID  . doxercalciferol  1 mcg Intravenous Q M,W,F-HD  . famotidine  20 mg Oral Daily  . folic acid  1 mg Oral Daily  . HYDROmorphone      . multivitamin  1 tablet Oral QHS  . predniSONE  20 mg Oral Q breakfast  . rosuvastatin  10 mg Oral Daily  . senna-docusate  1 tablet Oral BID  . sodium  chloride flush  3 mL Intravenous Q12H  . Warfarin - Pharmacist Dosing Inpatient   Does not apply q1600   . heparin 650 Units/hr (12/30/19 0300)   acetaminophen **OR** acetaminophen, calcium carbonate (dosed in mg elemental calcium), camphor-menthol **AND** hydrOXYzine, docusate sodium, heparin, HYDROcodone-acetaminophen, menthol-cetylpyridinium, ondansetron **OR** ondansetron (ZOFRAN) IV, sorbitol, zolpidem

## 2019-12-31 ENCOUNTER — Inpatient Hospital Stay (HOSPITAL_COMMUNITY): Payer: Medicare Other

## 2019-12-31 DIAGNOSIS — Z992 Dependence on renal dialysis: Secondary | ICD-10-CM

## 2019-12-31 DIAGNOSIS — R918 Other nonspecific abnormal finding of lung field: Secondary | ICD-10-CM

## 2019-12-31 DIAGNOSIS — I739 Peripheral vascular disease, unspecified: Secondary | ICD-10-CM

## 2019-12-31 DIAGNOSIS — N186 End stage renal disease: Secondary | ICD-10-CM

## 2019-12-31 DIAGNOSIS — M79609 Pain in unspecified limb: Secondary | ICD-10-CM

## 2019-12-31 DIAGNOSIS — M7989 Other specified soft tissue disorders: Secondary | ICD-10-CM

## 2019-12-31 DIAGNOSIS — I82401 Acute embolism and thrombosis of unspecified deep veins of right lower extremity: Secondary | ICD-10-CM

## 2019-12-31 DIAGNOSIS — Z7901 Long term (current) use of anticoagulants: Secondary | ICD-10-CM

## 2019-12-31 LAB — CBC WITH DIFFERENTIAL/PLATELET
Abs Immature Granulocytes: 0.02 10*3/uL (ref 0.00–0.07)
Basophils Absolute: 0 10*3/uL (ref 0.0–0.1)
Basophils Relative: 1 %
Eosinophils Absolute: 0 10*3/uL (ref 0.0–0.5)
Eosinophils Relative: 1 %
HCT: 34.1 % — ABNORMAL LOW (ref 36.0–46.0)
Hemoglobin: 10.7 g/dL — ABNORMAL LOW (ref 12.0–15.0)
Immature Granulocytes: 1 %
Lymphocytes Relative: 16 %
Lymphs Abs: 0.5 10*3/uL — ABNORMAL LOW (ref 0.7–4.0)
MCH: 29.6 pg (ref 26.0–34.0)
MCHC: 31.4 g/dL (ref 30.0–36.0)
MCV: 94.5 fL (ref 80.0–100.0)
Monocytes Absolute: 0.3 10*3/uL (ref 0.1–1.0)
Monocytes Relative: 10 %
Neutro Abs: 2.3 10*3/uL (ref 1.7–7.7)
Neutrophils Relative %: 71 %
Platelets: 172 10*3/uL (ref 150–400)
RBC: 3.61 MIL/uL — ABNORMAL LOW (ref 3.87–5.11)
RDW: 15.4 % (ref 11.5–15.5)
WBC: 3.2 10*3/uL — ABNORMAL LOW (ref 4.0–10.5)
nRBC: 0 % (ref 0.0–0.2)

## 2019-12-31 LAB — PROTIME-INR
INR: 2.1 — ABNORMAL HIGH (ref 0.8–1.2)
Prothrombin Time: 22.5 seconds — ABNORMAL HIGH (ref 11.4–15.2)

## 2019-12-31 LAB — HEMOGLOBIN AND HEMATOCRIT, BLOOD
HCT: 34.5 % — ABNORMAL LOW (ref 36.0–46.0)
Hemoglobin: 11.2 g/dL — ABNORMAL LOW (ref 12.0–15.0)

## 2019-12-31 MED ORDER — AMLODIPINE BESYLATE 2.5 MG PO TABS
2.5000 mg | ORAL_TABLET | Freq: Every day | ORAL | Status: DC
  Administered 2019-12-31 – 2020-01-01 (×2): 2.5 mg via ORAL
  Filled 2019-12-31 (×3): qty 1

## 2019-12-31 MED ORDER — WARFARIN SODIUM 5 MG PO TABS
7.0000 mg | ORAL_TABLET | Freq: Once | ORAL | Status: AC
  Administered 2019-12-31: 7 mg via ORAL
  Filled 2019-12-31: qty 1

## 2019-12-31 MED ORDER — HYDROMORPHONE HCL 1 MG/ML IJ SOLN
1.0000 mg | Freq: Once | INTRAMUSCULAR | Status: AC
  Administered 2019-12-31: 1 mg via INTRAVENOUS
  Filled 2019-12-31: qty 1

## 2019-12-31 MED ORDER — AMLODIPINE BESYLATE 5 MG PO TABS: 5.0000 mg | ORAL_TABLET | Freq: Every day | ORAL | Status: DC

## 2019-12-31 MED ORDER — WARFARIN - PHARMACIST DOSING INPATIENT: Freq: Every day | Status: DC

## 2019-12-31 MED ORDER — HYDROMORPHONE HCL 1 MG/ML IJ SOLN
0.5000 mg | INTRAMUSCULAR | Status: DC | PRN
  Administered 2019-12-31 – 2020-01-02 (×14): 0.5 mg via INTRAVENOUS
  Filled 2019-12-31 (×13): qty 0.5

## 2019-12-31 MED ORDER — HYDROMORPHONE HCL 2 MG/ML IJ SOLN: 0.5000 mg | INTRAMUSCULAR | Status: DC | PRN

## 2019-12-31 NOTE — Progress Notes (Signed)
Ona for heparin Indication: history of pulmonary embolus and DVT  Allergies  Allergen Reactions  . Cefazolin Anaphylaxis  . Ferrlecit [Na Ferric Gluc Cplx In Sucrose] Anaphylaxis    Anaphylactic shock/severe angioedema on 08/09/15 related to IV iron Ferrlecit IV)  required intubation for respiratory failure  . Cyclosporine Other (See Comments)    Loss of motor skills  . Lactose Intolerance (Gi) Nausea Only    Stomach pains also  . Latex Rash  . Vancomycin Rash    Patient Measurements: Height: 5\' 2"  (157.5 cm) Weight: 52.4 kg (115 lb 8.3 oz) IBW/kg (Calculated) : 50.1 Heparin Dosing Weight: 48.6 kg   Vital Signs: Temp: 98.5 F (36.9 C) (07/04 1146) Temp Source: Oral (07/04 1146) BP: 135/103 (07/04 1146) Pulse Rate: 50 (07/04 0342)  Labs: Recent Labs    12/29/19 2320 12/30/19 1130 12/31/19 0249 12/31/19 0507  HGB 10.9*  --   --  10.7*  HCT 36.4  --   --  34.1*  PLT 148*  --   --  172  LABPROT  --  21.9* 22.5*  --   INR  --  2.0* 2.1*  --   HEPARINUNFRC  --  0.32  --   --   CREATININE 10.61*  --   --   --     Estimated Creatinine Clearance: 6.1 mL/min (A) (by C-G formula based on SCr of 10.61 mg/dL (H)).  Assessment: 29 y.o. female with h/o PE/DVT for anticoagulation.   Home Coumadin regimen is 7 mg daily.  INR today is 2.1. Found to have IM hematoma involving the psoas muscle (R>L). Duplex finding acute R DVT.  Evaluated by MD and plan for possible IVC filter and continue warfarin at this time. Hgb 10.7, plt 172.   Goal of Therapy:  INR 2-3 - aiming for lower end of the goal  Heparin level 0.3-0.5 units/ml Monitor platelets by anticoagulation protocol: Yes   Plan:  -Warfarin 7 mg PO x1 now -Monitor daily INR, CBC, and for s/sx of bleeding   Antonietta Jewel, PharmD, [place] Pharmacist  Phone: [phone] 12/31/2019 3:21 PM  Please check AMION for all [place] phone numbers After 10:00 PM, call [place]  [phone]

## 2019-12-31 NOTE — Consult Note (Addendum)
New Hematology/Oncology Consult   Requesting VQ:[id number] Regalado       Reason for Consult: DVT, hematoma  HPI: Ms. Kleeman has a complex medical history including end-stage renal disease, maintained on hemodialysis.  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  The Coumadin is managed by nephrology and her primary provider.  She was admitted 12/24/2019 with hemoptysis.  She reports a cough and fever for several days prior to hospital admission.  She was evaluated by pulmonary medicine and underwent a bronchoscopy 12/26/2019..  This revealed evidence of bronchitis with inflammation of the right main and right bronchus intermedius.  Blood was noted in the right airway and suctioned.  No active bleeding.  A CT of the chest on 12/25/2019 revealed occlusion of an SVC stent, right middle and lower lobe groundglass airspace disease, and a 6 mm right upper lobe groundglass nodule.  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  The PT/INR was subtherapeutic at 1.9 on hospital admission.  Coumadin was placed on hold and she was started on heparin anticoagulation 12/25/2019.  Coumadin was resumed 12/27/2019.  Ms. Rahimi reports low back pain and right thigh pain beginning yesterday.  A CT abdomen/pelvis on 12/30/2019 revealed an intramuscular hematoma at the bilateral psoas muscles.  The heparin infusion was discontinued 12/30/2019.    Past Medical History:  Diagnosis Date  . Anemia   . Dyslipidemia 12/25/2019  . ESRD (end stage renal disease) on dialysis ([place]) 08/10/2008   MWF; [facility]; [place]. 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 on HD since 2010. TDC dependent for several yrs.    . Focal segmental glomerulosclerosis   . GERD (gastroesophageal reflux disease)   . Hemoptysis 12/2019  .  Hyperlipidemia   . Hypertension   . 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 Secondary hyperparathyroidism ([place])   . SIRS (systemic inflammatory response syndrome) ([place]) 11/23/2016  :  Past Surgical History:  Procedure Laterality Date  . BRONCHIAL WASHINGS  12/26/2019   Procedure: BRONCHIAL WASHINGS;  Surgeon: Laurin Coder, MD;  Location: [facility];  Service: Pulmonary;;  . COLON SURGERY  ~ 2011/2012   "for bowel obstruction"  . INSERTION OF DIALYSIS CATHETER Left ~ 2016   chest  . KIDNEY TRANSPLANT  2007  . VIDEO BRONCHOSCOPY N/A 12/26/2019   Procedure: VIDEO BRONCHOSCOPY WITHOUT FLUORO;  Surgeon: Laurin Coder, MD;  Location: [facility];  Service: Pulmonary;  Laterality: N/A;  :   Current Facility-Administered Medications:  .  acetaminophen (TYLENOL) tablet 650 mg, 650 mg, Oral, Q6H PRN, 650 mg at 12/30/19 0642 **OR** acetaminophen (TYLENOL) suppository 650 mg, 650 mg, Rectal, Q6H PRN, Olalere, Adewale A, MD .  amLODipine (NORVASC) tablet 2.5 mg, 2.5 mg, Oral, Daily, Regalado, Belkys A, MD, 2.5 mg at 12/31/19 1041 .  calcium acetate (PHOSLO) capsule 1,334 mg, 1,334 mg, Oral, TID WC, Roney Jaffe, MD, 1,334 mg at 12/30/19 1157 .  calcium carbonate (dosed in mg elemental calcium) suspension 500 mg of elemental calcium, 500 mg of elemental calcium, Oral, Q6H PRN, Olalere, Adewale A, MD .  calcium carbonate (TUMS - dosed in mg elemental calcium) chewable tablet 200 mg of elemental calcium, 1 tablet, Oral, Daily, Roney Jaffe, MD, 200 mg of elemental calcium at 12/31/19 0944 .  camphor-menthol (SARNA) lotion  1 application, 1 application, Topical, G6Y PRN **AND** hydrOXYzine (ATARAX/VISTARIL) tablet 25 mg, 25 mg, Oral, Q8H PRN, Olalere, Adewale A, MD .  Chlorhexidine Gluconate Cloth 2 % PADS 6 each, 6 each, Topical, Q0600, Olalere, Adewale A, MD, 6 each at 12/31/19 0537 .  Chlorhexidine Gluconate Cloth 2 % PADS 6 each, 6 each, Topical, Q0600, Roney Jaffe, MD, 6 each at 12/31/19 0537 .  Chlorhexidine Gluconate Cloth 2 % PADS 6 each, 6 each, Topical, Q0600, Roney Jaffe, MD, 6 each at 12/31/19 0537 .  cholecalciferol (VITAMIN D3) tablet 2,000 Units, 2,000 Units, Oral, Daily, Olalere, Adewale A, MD, 2,000 Units at 12/31/19 0943 .  docusate sodium (ENEMEEZ) enema 283 mg, 1 enema, Rectal, PRN, Olalere, Adewale A, MD .  doxercalciferol (HECTOROL) injection 1 mcg, 1 mcg, Intravenous, Q M,W,F-HD, Olalere, Adewale A, MD .  famotidine (PEPCID) tablet 20 mg, 20 mg, Oral, Daily, Allie Bossier, MD, 20 mg at 12/31/19 0943 .  folic acid (FOLVITE) tablet 1 mg, 1 mg, Oral, Daily, Olalere, Adewale A, MD, 1 mg at 12/31/19 0944 .  HYDROcodone-acetaminophen (NORCO/VICODIN) 5-325 MG per tablet 1 tablet, 1 tablet, Oral, Q6H PRN, Regalado, Belkys A, MD, 1 tablet at 12/31/19 1041 .  HYDROmorphone (DILAUDID) injection 0.5 mg, 0.5 mg, Intravenous, Q3H PRN, Regalado, Belkys A, MD, 0.5 mg at 12/31/19 1150 .  menthol-cetylpyridinium (CEPACOL) lozenge 3 mg, 1 lozenge, Oral, PRN, Allie Bossier, MD .  multivitamin (RENA-VIT) tablet 1 tablet, 1 tablet, Oral, QHS, Olalere, Adewale A, MD, 1 tablet at 12/30/19 2115 .  ondansetron (ZOFRAN) tablet 4 mg, 4 mg, Oral, Q6H PRN **OR** ondansetron (ZOFRAN) injection 4 mg, 4 mg, Intravenous, Q6H PRN, Olalere, Adewale A, MD, 4 mg at 12/31/19 0824 .  predniSONE (DELTASONE) tablet 20 mg, 20 mg, Oral, Q breakfast, Olalere, Adewale A, MD, 20 mg at 12/31/19 0742 .  rosuvastatin (CRESTOR) tablet 10 mg, 10 mg, Oral, Daily, Olalere, Adewale A, MD, 10 mg at 12/31/19 0944 .  senna-docusate (Senokot-S) tablet 1 tablet, 1 tablet, Oral, BID, Regalado, Belkys A, MD, 1 tablet at 12/31/19 0944 .  sodium chloride flush (NS) 0.9 % injection 3 mL, 3 mL, Intravenous, Q12H, Olalere, Adewale A, MD, 3 mL at 12/31/19 0946 .  sorbitol 70 % solution 30 mL, 30 mL, Oral, PRN, Olalere, Adewale A, MD, 30 mL at 12/28/19 2142 .  zolpidem (AMBIEN) tablet 5 mg, 5  mg, Oral, QHS PRN, Olalere, Adewale A, MD:  . amLODipine  2.5 mg Oral Daily  . calcium acetate  1,334 mg Oral TID WC  . calcium carbonate  1 tablet Oral Daily  . Chlorhexidine Gluconate Cloth  6 each Topical Q0600  . Chlorhexidine Gluconate Cloth  6 each Topical Q0600  . Chlorhexidine Gluconate Cloth  6 each Topical Q0600  . cholecalciferol  2,000 Units Oral Daily  . doxercalciferol  1 mcg Intravenous Q M,W,F-HD  . famotidine  20 mg Oral Daily  . folic acid  1 mg Oral Daily  . multivitamin  1 tablet Oral QHS  . predniSONE  20 mg Oral Q breakfast  . rosuvastatin  10 mg Oral Daily  . senna-docusate  1 tablet Oral BID  . sodium chloride flush  3 mL Intravenous Q12H  :  Allergies  Allergen Reactions  . Cefazolin Anaphylaxis  . Ferrlecit [Na Ferric Gluc Cplx In Sucrose] Anaphylaxis    Anaphylactic shock/severe angioedema on 08/09/15 related to IV iron Ferrlecit IV)  required intubation for respiratory failure  . Cyclosporine Other (See Comments)  Loss of motor skills  . Lactose Intolerance (Gi) Nausea Only    Stomach pains also  . Latex Rash  . Vancomycin Rash  :   SOCIAL HISTORY: She lives with the mother in [place].  She works as a Optometrist.  She does not use tobacco or alcohol.  Review of Systems:   Positives include: Pain in lower back and right thigh, face swelling-she relates this to needing a dialysis treatment  A complete ROS was otherwise negative.   Physical Exam:  Blood pressure (!) 135/103, pulse (!) 50, temperature 98.5 F (36.9 C), temperature source Oral, resp. rate 10, height 5\' 2"  (1.575 m), weight 115 lb 8.3 oz (52.4 kg), last menstrual period 10/28/2019, SpO2 95 %.  HEENT: Mild bilateral facial edema Lungs: Clear bilaterally Cardiac: Regular rate and rhythm, distant heart sounds Abdomen: No hepatosplenomegaly, nontender  Vascular: Mild edema of the right thigh Lymph nodes: No cervical, supraclavicular, axillary, or inguinal  nodes Neurologic: Alert and oriented, the motor exam appears intact in the upper and lower extremities bilaterally Skin: No rash Musculoskeletal: Tender at the right thigh  LABS:  Recent Labs    12/29/19 2320 12/31/19 0507  WBC 2.8* 3.2*  HGB 10.9* 10.7*  HCT 36.4 34.1*  PLT 148* 172    Recent Labs    12/29/19 2320  NA 133*  K 5.3*  CL 98  CO2 23  GLUCOSE 99  BUN 38*  CREATININE 10.61*  CALCIUM 8.2*      RADIOLOGY:  CT ABDOMEN PELVIS WO CONTRAST  Result Date: 12/30/2019 CLINICAL DATA:  29 year old female with acute abdomen pain. EXAM: CT ABDOMEN AND PELVIS WITHOUT CONTRAST TECHNIQUE: Multidetector CT imaging of the abdomen and pelvis was performed following the standard protocol without IV contrast. COMPARISON:  Abdominal radiograph dated 12/28/2019 chest CT dated 12/25/2019. FINDINGS: Evaluation of this exam is limited in the absence of intravenous contrast. Lower chest: Partially visualized small bilateral pleural effusions with associated partial compressive atelectasis of the lower lobes versus pneumonia, new since the recent chest CT. There is bibasilar linear and streaky densities which may represent edema, or pneumonia. Aspiration is not excluded. There is mild cardiomegaly. Trace pericardial effusion or mild pericardial thickening. Clinical correlation is recommended to evaluate for possibility of pericarditis. Partially visualized probable tip of the dialysis catheter at the cavoatrial junction. There is partial calcification of the mitral annulus. No intra-abdominal free air or free fluid. Hepatobiliary: There is enlargement of the left lobe of the liver which may represent early changes of cirrhosis. No intrahepatic biliary ductal dilatation. High attenuating content within the gallbladder may represent vicarious excretion of recently administered contrast versus sludge or small stones. Pancreas: The pancreas is poorly visualized. There is mild peripancreatic stranding,  likely related to systemic congestion or edema. Correlation with pancreatic enzymes recommended to exclude pancreatitis. Spleen: The spleen is grossly unremarkable. Adrenals/Urinary Tract: Status post prior nephrectomy. Evaluation of the surgical bed is very limited due to edema. The adrenal glands are poorly visualized. The urinary bladder is collapsed. Stomach/Bowel: There is oral contrast throughout the small bowel and in the colon. No evidence of bowel obstruction. Air is noted in the distal esophagus which may represent reflux. An air-filled tubular structure in the right lower quadrant likely represents a normal appendix. Vascular/Lymphatic: The abdominal aorta is grossly unremarkable. There are scattered linear calcification along the IVC and right common iliac vein concerning for chronic venous thrombus or scarring. Evaluation of the vasculature is very limited on this noncontrast CT.  No portal venous gas identified. Nodular densities along the aorta and retroperitoneum may represent vascular collaterals or mildly enlarged lymph nodes. Reproductive: The uterus is anteverted and grossly unremarkable. Metallic coil material noted in the left hemipelvis. The ovaries are not well visualized. Other: There is intramuscular hematoma involving the psoas muscles bilaterally, right greater left. The right hematoma measures approximately 4 x 5 cm in greatest axial dimensions and 7 cm in craniocaudal length. There is layering density within the hematoma which can be seen with bleeding associated with coagulation disorder. There is diffuse subcutaneous stranding of the pelvic wall. Musculoskeletal: No acute osseous pathology. Bilateral femoral head screws. IMPRESSION: 1. Intramuscular hematoma involving the psoas muscles bilaterally, right greater left. There is layering density within the hematoma which can be seen with coagulopathy. 2. No bowel obstruction. Normal appendix. 3. Partially visualized small bilateral  pleural effusions with associated partial compressive atelectasis of the lower lobes versus pneumonia, new since the recent chest CT. Aspiration is not excluded. 4. Mild cardiomegaly. 5. Aortic Atherosclerosis (ICD10-I70.0). 6. Diffuse calcification of IVC and additional findings as above. These results were called by telephone at the time of interpretation on 12/30/2019 at 8:28 pm to nurse,Hagerman, who verbally acknowledged these results. Electronically Signed   By: Anner Crete M.D.   On: 12/30/2019 20:28   DG Abd 1 View  Result Date: 12/28/2019 CLINICAL DATA:  Left lower quadrant pain, history of SBO EXAM: ABDOMEN - 1 VIEW COMPARISON:  None. FINDINGS: Large volume high attenuation material within the colon, could reflect prior ingestion of bismuth containing products or inspissated stool within overall large colonic stool burden. No high-grade obstructive bowel gas pattern is seen however. Surgical clips noted in the left upper quadrant and left hemipelvis. Prior transcervical fixation of both femora. Mild levocurvature of the lumbar spine. IMPRESSION: 1. Large volume high attenuation material within the colon, could reflect prior ingestion of bismuth containing products or inspissated stool within an overall large colonic stool burden. Correlate for features of constipation. 2. No high-grade obstructive bowel gas pattern. Electronically Signed   By: Lovena Le M.D.   On: 12/28/2019 20:10   CT Angio Chest PE W and/or Wo Contrast  Addendum Date: 12/25/2019   ADDENDUM REPORT: 12/25/2019 01:45 ADDENDUM: The stent within the superior vena cava was in fact present on the previous CT, but was more difficult to appreciate due to contrast within the patent lumen. The occlusion stent near the atrial caval junction has occurred in the interim since 2019. Electronically Signed   By: Randa Ngo M.D.   On: 12/25/2019 01:45   Result Date: 12/25/2019 CLINICAL DATA:  Productive cough for 1 week, hemoptysis  EXAM: CT ANGIOGRAPHY CHEST WITH CONTRAST TECHNIQUE: Multidetector CT imaging of the chest was performed using the standard protocol during bolus administration of intravenous contrast. Multiplanar CT image reconstructions and MIPs were obtained to evaluate the vascular anatomy. CONTRAST:  43mL OMNIPAQUE IOHEXOL 350 MG/ML SOLN COMPARISON:  12/24/2017, 12/24/2019 FINDINGS: Cardiovascular: Contrast was injected via the left upper extremity. Since the previous exam, a stent has been placed within the superior vena cava just proximal to the atrial caval junction. There is evidence of complete occlusion of the stent, with extensive venous collateral opacification as result of the pressure injection. Contrast refluxes into numerous collateral veins in the mediastinum and retroperitoneum. This results in a nondiagnostic evaluation of the pulmonary vasculature due to insufficient contrast enhancement. Heart is unremarkable without pericardial effusion. Thoracic aorta is normal in caliber with no aneurysm or  dissection. Mediastinum/Nodes: No enlarged mediastinal, hilar, or axillary lymph nodes. Thyroid gland, trachea, and esophagus demonstrate no significant findings. Lungs/Pleura: There is patchy ground-glass airspace disease within the right lung base within the right middle and right lower lobes. This could reflect aspiration or hemorrhage given clinical presentation. There is a 6 mm ground-glass right upper lobe nodule abutting the minor fissure, reference image 38. No other airspace disease, effusion, or pneumothorax. The central airways are patent. Upper Abdomen: Extensive venous collaterals are seen within the upper abdomen. This includes large varices surrounding the distal esophagus, which may account for the patient's hematemesis. Musculoskeletal: There are no acute or destructive bony lesions. Chronic renal osteodystrophy again noted. Reconstructed images demonstrates no additional findings. Review of the MIP  images confirms the above findings. IMPRESSION: 1. Occlusion of the SVC stent. As result, the contrast power injected into the left upper extremity refluxes into numerous mediastinal and retroperitoneal venous collaterals. Opacification of the pulmonary vasculature is nondiagnostic for the detection of pulmonary emboli. 2. Right middle and right lower lobe ground-glass airspace disease which may reflect hemorrhage or aspiration. 3. 6 mm right upper lobe ground-glass nodule. Initial follow-up with CT at 6-12 months is recommended to confirm persistence. If persistent, repeat CT is recommended every 2 years until 5 years of stability has been established. This recommendation follows the consensus statement: Guidelines for Management of Incidental Pulmonary Nodules Detected on CT Images: From the Fleischner Society 2017; Radiology 2017; 284:228-243. 4. Numerous varices surround the distal esophagus, and may account for the patient's hematemesis. Electronically Signed: By: Randa Ngo M.D. On: 12/25/2019 00:31   DG Chest Portable 1 View  Result Date: 12/24/2019 CLINICAL DATA:  Cough. EXAM: PORTABLE CHEST 1 VIEW COMPARISON:  July 04, 2018 FINDINGS: There is a well-positioned left-sided tunneled dialysis catheter. A left brachiocephalic vein stent is noted. The heart size is stable. There is a small right-sided pleural effusion which is stable from prior study. There is no pneumothorax. No focal infiltrate. IMPRESSION: 1. Stable small right-sided pleural effusion. 2. Well-positioned left-sided tunneled dialysis catheter. Electronically Signed   By: Constance Holster M.D.   On: 12/24/2019 21:53   DG HIP UNILAT WITH PELVIS 1V RIGHT  Result Date: 12/30/2019 CLINICAL DATA:  Right hip pain for 2 days.  No known injury. EXAM: DG HIP (WITH OR WITHOUT PELVIS) 1V RIGHT COMPARISON:  None. FINDINGS: No acute bony or joint abnormality is identified. Single screws are in place across both femoral necks. The screws are  intact without complicating feature. No avascular necrosis of the femoral heads. Mild bilateral acetabular dysplasia is more notable on the right. Joint spaces are preserved. Vascular coils and surgical clips in the left pelvis are seen. IMPRESSION: No acute abnormality.  Negative for arthropathy. Mild bilateral acetabular dysplasia. Screws in place across the femoral necks bilaterally may be due for fixation of old slipped capital femoral epiphysis. Electronically Signed   By: Inge Rise M.D.   On: 12/30/2019 14:16   VAS [place] LOWER EXTREMITY VENOUS (DVT)  Result Date: 12/31/2019  Lower Venous DVTStudy Indications: Pain, and Swelling. Other Indications: SVC stent occlusion. Risk Factors: History of PE and DVT X 2 ESRD, on dialysis since the age of 39. Anticoagulation: Coumadin. Limitations: Bowel gas, pain in the abdomen secondary to intramuscular hematoma involving the psoas muscles. Comparison Study: No prior study on file Performing Technologist: Sharion Dove RVS  Examination Guidelines: A complete evaluation includes B-mode imaging, spectral Doppler, color Doppler, and power Doppler as needed of all accessible  portions of each vessel. Bilateral testing is considered an integral part of a complete examination. Limited examinations for reoccurring indications may be performed as noted. The reflux portion of the exam is performed with the patient in reverse Trendelenburg.  +---------+---------------+---------+-----------+----------+--------------+ RIGHT    CompressibilityPhasicitySpontaneityPropertiesThrombus Aging +---------+---------------+---------+-----------+----------+--------------+ CFV      Partial        No       No                   Acute          +---------+---------------+---------+-----------+----------+--------------+ SFJ      Full                                                        +---------+---------------+---------+-----------+----------+--------------+ FV Prox   Partial                                      Acute          +---------+---------------+---------+-----------+----------+--------------+ FV Mid   Partial                                      Acute          +---------+---------------+---------+-----------+----------+--------------+ FV DistalPartial        No       No                   Acute          +---------+---------------+---------+-----------+----------+--------------+ PFV      Full                                                        +---------+---------------+---------+-----------+----------+--------------+ POP      None           No       No                   Acute          +---------+---------------+---------+-----------+----------+--------------+ PTV      None                                         Acute          +---------+---------------+---------+-----------+----------+--------------+ PERO     None                                         Acute          +---------+---------------+---------+-----------+----------+--------------+ Gastroc  None                                         Acute          +---------+---------------+---------+-----------+----------+--------------+  EIV                                                   Not visualized +---------+---------------+---------+-----------+----------+--------------+ CIV                                                   Not visualized +---------+---------------+---------+-----------+----------+--------------+ Patient is extremely tender at groin and into abdomen secondary to psoas hematoma. Unable to visualize external or common iliac veins due to pain and bowel gas.  +----+---------------+---------+-----------+----------+--------------+ LEFTCompressibilityPhasicitySpontaneityPropertiesThrombus Aging +----+---------------+---------+-----------+----------+--------------+ CFV Full           Yes      Yes                                  +----+---------------+---------+-----------+----------+--------------+     Summary: RIGHT: - Findings consistent with acute deep vein thrombosis involving the right common femoral vein, right femoral vein, right popliteal vein, right posterior tibial veins, right peroneal veins, and right gastrocnemius veins.  LEFT: - No evidence of common femoral vein obstruction.  *See table(s) above for measurements and observations.    Preliminary     Assessment and Plan:   1.  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  History of pulmonary embolism and upper extremity deep vein thrombosis, maintained on Coumadin anticoagulation chronically 3.  Admission with hemoptysis 12/24/2019-bronchoscopy suggestive of bronchitis, resolved 4.  Retroperitoneal-bilateral psoas hematomas, likely related to heparin anticoagulation (supratherapeutic at times) during this hospital admission 5.  Acute right lower extremity DVT on Doppler 12/31/2019 6.  Pain secondary to retroperitoneal hemorrhage and right leg DVT 7.  SVC stent occlusion 8.  Right lung nodule on chest CT 12/25/2019-to be followed by pulmonary medicine as an outpatient   Ms. Donovan was admitted with hemoptysis felt to be related to bronchitis.  She is chronically maintained on Coumadin anticoagulation for history of venous thromboembolic disease.  The PT/INR was subtherapeutic on hospital admission and remained subtherapeutic for the next several days.  It is not clear to me that she has failed Coumadin anticoagulation.  The CT yesterday revealed evidence of bilateral psoas hematomas.  This is likely related to heparin anticoagulation during this admission.  No known trauma.   The current pain appears to be related to the right lower extremity DVT.  I discussed the very difficult current situation with Ms. Grandville Silos.  She needs anticoagulation therapy, but has hemorrhage in the retroperitoneum.  We can consider placing a  retrievable IVC filter and holding anticoagulation or continuing Coumadin with clinical and radiologic follow-up of the retroperitoneal hematomas.  I did not favor starting apixaban with the recent hemorrhage.  Would start apixaban if she fails Coumadin.  I will discuss the case with Dr. Tyrell Antonio.  My initial impression is to request placement of a retrievable IVC filter and continue Coumadin anticoagulation.  She will need close outpatient follow-up of the PT/INR via her primary provider and nephrology.    Betsy Coder, MD 12/31/2019, 2:18 PM  I discussed the case with Dr. Tyrell Antonio.  I contacted her mother by telephone and discussed her current status and treatment recommendations.  Her mother is  in agreement with proceeding with IVC filter placement.

## 2019-12-31 NOTE — Progress Notes (Addendum)
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  She reports coughing up a lot of blood started the day prior to admission, prior red blood.  She had a cold and had been coughing and thinks this is what led to it.  She report low-grade fever.  No GI symptoms, no sick contacts.  She was feeling well other than mild cough onto the day prior to admission.  She is on Coumadin and her INR fluctuates. Patient had a CT angiogram which was negative for PE with show obstruction of her SVC stent.  Dr. Doren Custard on-call vascular surgery advised no intervention at this time. Consulted, patient underwent bronchoscopy on 6/29.  Pulmonology think hemoptysis likely related to  Bronchitis.  Plan to continue with heparin and resume Coumadin today.  Assessment & Plan:   Principal Problem:   Hemoptysis Active Problems:   End stage renal disease on dialysis [facility])   Essential hypertension   History of pulmonary embolism   Occlusion of stent of peripheral artery   Dyslipidemia  1-Hemoptysis: Evaluated by CCM, underwent bronchoscopy cultures negative so far and  cytology negative. Pulmonologist think hemoptysis is related to acute bronchitis. Patient was started on Prednisone.  -Patient has significant pulmonary esophageal collateral with esophageal varices, with patient appears certain that this is about his rather than hematemesis Hb stable.   Intramuscular Hematoma,Psoas Muscle: Hb remain stable at 10.  Will hold coumadin today.  Repeat hb tonight and tomorrow am.  IV dilaudid PRN for pain.  Suspect was related to heparin gtt.   Addendum:  Right LE DVT, acute femoral, tibial peroneal:  Hematology consulted.  Will  follow Dr Benay Spice recommendation, we might have to continue with coumadin.  INR 2.1  Discussed with Dr Benay Spice, plan to proceed with coumadin resumption and IVC filter placement.   B/L lower atelectasis;  No fever.  Incentive spirometry.   Hypocalcemia; replaced. Resolved.  Started on oral supplement.   ESRD on hemodialysis MWF: Received Lokelma for hyperkalemia.   Hyperkalemia: Resolved  SVC stent occlusion: Per EMR discussion with Dr. Doren Custard by EDP.  Appears to be subacute versus chronic in nature.  No surgical intervention indicated at this time. Discussed with Renal, plan for formal Vascular consultation.   Hypotension: Resolved  Hyperlipidemia: Continue with rosuvastatin  History of PE times 2 and DVT: Patient need to be on anticoagulation indefinitely Continue with Heparin resume Coumadin. INR pending for today.   Leukopenia:  B12 normal.  Left side Abdominal pain; probably related to constipation. Sorbitol ordered.  Pain resolved after BM.   Right hip pain; psoas Hematoma.   Estimated body mass index is 21.13 kg/m as calculated from the following:   Height as of this encounter: _0  (1.575 m).   Weight as of this encounter: 52.4 kg.   DVT prophylaxis: Heparin drip Code Status: Full code Family Communication: Care discussed with patient and Mother who was at bedside.  Disposition Plan:  Status is: Inpatient  Remains inpatient appropriate because:Hemodynamically unstable   Dispo: The patient is from: Home              Anticipated d/c is to: Home              Anticipated d/c  date is: 2 days              Patient currently is not medically stable to d/c.  Monitor hb in setting of iliopsoas hematoma       Consultants:   CCM  Nephrology  Procedures:   HD  Bronchoscopy  Antimicrobials:    Subjective: Still complaining of right groin, hip pain. Pain is not worse,.   Objective: Vitals:   12/30/19 2004 12/30/19 2324 12/31/19 0342 12/31/19  0740  BP: (!) 156/112 (!) 150/110 (!) 165/109 (!) 139/109  Pulse: (!) 52 (!) 50 (!) 50   Resp: _0 Temp: 98.2 F (36.8 C) 98.3 F (36.8 C) 98.1 F (36.7 C) 98.7 F (37.1 C)  TempSrc: Oral Oral Oral Oral  SpO2: 99% 100% 99% 95%  Weight:   52.4 kg   Height:        Intake/Output Summary (Last 24 hours) at 12/31/2019 0934 Last data filed at 12/30/2019 1258 Gross per 24 hour  Intake 232.29 ml  Output --  Net 232.29 ml   Filed Weights   12/27/19 1137 12/27/19 1455 12/31/19 0342  Weight: 49.7 kg 48.6 kg 52.4 kg    Examination:  General exam: NAD Respiratory system: CTA Cardiovascular system: S 1, S 2 RRR Gastrointestinal system: BS present, soft, nt Central nervous system: Alert.  Extremities: Symmetric power   Data Reviewed: I have personally reviewed following labs and imaging studies  CBC: Recent Labs  Lab 12/24/19 2107 12/25/19 1008 12/26/19 0303 12/27/19 1200 12/28/19 0307 12/29/19 2320 12/31/19 0507  WBC 2.6*  --  2.9* 3.8* 2.8* 2.8* 3.2*  NEUTROABS 1.4*  --   --   --   --   --  2.3  HGB 12.5   < > 13.0 11.2* 11.1* 10.9* 10.7*  HCT 41.5   < > 43.0 37.3 36.4 36.4 34.1*  MCV 97.0  --  98.2 97.1 95.5 96.3 94.5  PLT 190  --  170 156 159 148* 172   < > = values in this interval not displayed.   Basic Metabolic Panel: Recent Labs  Lab 12/24/19 2335 12/26/19 0303 12/27/19 1200 12/28/19 0307 12/29/19 2320  NA 137 137 132* 136 133*  K 6.4* 3.9 4.5 4.5 5.3*  CL 98 99 97* 101 98  CO2 20* _1 GLUCOSE 94 59* 199* 93 99  BUN 61* 18 41* 24* 38*  CREATININE 13.39* 6.76* 10.76* 6.98* 10.61*  CALCIUM 7.3* 8.1* 6.6* 6.5* 8.2*  MG  --   --  2.3 2.1 2.5*  PHOS  --   --  4.5 3.6 4.1   GFR: Estimated Creatinine Clearance: 6.1 mL/min (A) (by C-G formula based on SCr of 10.61 mg/dL (H)). Liver Function Tests: Recent Labs  Lab 12/24/19 2335 12/27/19 1200 12/28/19 0307 12/29/19 2320  AST _2 ALT _3 ALKPHOS 113 86 78 83   BILITOT 0.6 0.3 0.6 0.6  PROT 8.7* 7.2 6.8 6.9  ALBUMIN 4.6 3.6 3.6 3.7   Recent Labs  Lab 12/24/19 2107  LIPASE 63*   No results for input(s): AMMONIA in the last 168 hours. Coagulation Profile: Recent Labs  Lab 12/24/19 2107 12/27/19 1200 12/28/19 0307 12/30/19 1130 12/31/19 0249  INR 1.9* 1.7* 1.5* 2.0* 2.1*   Cardiac Enzymes: No results for input(s): CKTOTAL, CKMB, CKMBINDEX, TROPONINI in the last 168 hours. BNP (last 3 results) No results for input(s): PROBNP in the last 8760  hours. HbA1C: No results for input(s): HGBA1C in the last 72 hours. CBG: Recent Labs  Lab 12/26/19 0738 12/26/19 0842  GLUCAP 68* 73   Lipid Profile: No results for input(s): CHOL, HDL, LDLCALC, TRIG, CHOLHDL, LDLDIRECT in the last 72 hours. Thyroid Function Tests: No results for input(s): TSH, T4TOTAL, FREET4, T3FREE, THYROIDAB in the last 72 hours. Anemia Panel: No results for input(s): VITAMINB12, FOLATE, FERRITIN, TIBC, IRON, RETICCTPCT in the last 72 hours. Sepsis Labs: No results for input(s): PROCALCITON, LATICACIDVEN in the last 168 hours.  Recent Results (from the past 240 hour(s))  SARS Coronavirus 2 by RT PCR (hospital order, performed in [facility] hospital lab) Nasopharyngeal Nasopharyngeal Swab     Status: None   Collection Time: 12/24/19  9:12 PM   Specimen: Nasopharyngeal Swab  Result Value Ref Range Status   SARS Coronavirus 2 NEGATIVE NEGATIVE Final    Comment: (NOTE) SARS-CoV-2 target nucleic acids are NOT DETECTED.  The SARS-CoV-2 RNA is generally detectable in upper and lower respiratory specimens during the acute phase of infection. The lowest concentration of SARS-CoV-2 viral copies this assay can detect is 250 copies / mL. A negative result does not preclude SARS-CoV-2 infection and should not be used as the sole basis for treatment or other patient management decisions.  A negative result may occur with improper specimen collection / handling, submission  of specimen other than nasopharyngeal swab, presence of viral mutation(s) within the areas targeted by this assay, and inadequate number of viral copies (<250 copies / mL). A negative result must be combined with clinical observations, patient history, and epidemiological information.  Fact Sheet for Patients:   StrictlyIdeas.no  Fact Sheet for Healthcare Providers: BankingDealers.co.za  This test is not yet approved or  cleared by the [place] FDA and has been authorized for detection and/or diagnosis of SARS-CoV-2 by FDA under an Emergency Use Authorization (EUA).  This EUA will remain in effect (meaning this test can be used) for the duration of the COVID-19 declaration under Section 564(b)(1) of the Act, 21 U.S.C. section 360bbb-3(b)(1), unless the authorization is terminated or revoked sooner.  Performed at [facility], [address] [place], [place] [address]   Blood culture (routine x 2)     Status: None   Collection Time: 12/25/19  1:40 AM   Specimen: BLOOD LEFT ARM  Result Value Ref Range Status   Specimen Description   Final    BLOOD LEFT ARM Performed at [facility], [address] [place], [place] [address]    Special Requests   Final    BOTTLES DRAWN AEROBIC AND ANAEROBIC Blood Culture adequate volume Performed at [facility], [address] [place], [place] [address]    Culture   Final    NO GROWTH 5 DAYS Performed at [facility], [place] [address] [place], [place] [address]    Report Status 12/30/2019 FINAL  Final  Blood culture (routine x 2)     Status: None   Collection Time: 12/25/19  1:50 AM   Specimen: BLOOD RIGHT HAND  Result Value Ref Range Status   Specimen Description   Final    BLOOD RIGHT HAND Performed at [facility], [address] [place], [place] [address]    Special Requests   Final    BOTTLES DRAWN AEROBIC AND ANAEROBIC Blood  Culture adequate volume Performed at [facility], [address] [place], [place]  [address]    Culture   Final    NO GROWTH 5 DAYS Performed at [facility], [place] [address] [place], [place] [address]    Report Status 12/30/2019 FINAL  Final  MRSA PCR Screening     Status: None   Collection Time: 12/25/19  6:40 AM   Specimen: Nasopharyngeal  Result Value Ref Range Status   MRSA by PCR NEGATIVE NEGATIVE Final    Comment:        The GeneXpert MRSA Assay (FDA approved for NASAL specimens only), is one component of a comprehensive MRSA colonization surveillance program. It is not intended to diagnose MRSA infection nor to guide or monitor treatment for MRSA infections. Performed at [facility], [place] [address], [place], [place] [address]   Culture, respiratory     Status: None   Collection Time: 12/26/19  8:18 AM   Specimen: Bronchoalveolar Lavage; Respiratory  Result Value Ref Range Status   Specimen Description BRONCHIAL ALVEOLAR LAVAGE  Final   Special Requests NONE  Final   Gram Stain   Final    RARE WBC PRESENT,BOTH PMN AND MONONUCLEAR NO ORGANISMS SEEN    Culture   Final    FEW MORAXELLA CATARRHALIS(BRANHAMELLA) BETA LACTAMASE POSITIVE Performed at [facility], [place] [address], [place], [place] [address]    Report Status 12/28/2019 FINAL  Final  Fungus Culture With Stain     Status: None (Preliminary result)   Collection Time: 12/26/19  8:18 AM   Specimen: Bronchial Alveolar Lavage  Result Value Ref Range Status   Fungus Stain Final report  Final    Comment: (NOTE) Performed At: [facility] [place], [place] [id number] Rush Farmer MD OZ:[id number]    Fungus (Mycology) Culture PENDING  Incomplete   Fungal Source BRONCHIAL ALVEOLAR LAVAGE  Final    Comment: Performed at [facility], [place] [address], [place], [place] [address]  Fungus Culture Result     Status: None   Collection Time: 12/26/19  8:18  AM  Result Value Ref Range Status   Result 1 Comment  Final    Comment: (NOTE) KOH/Calcofluor preparation:  no fungus observed. Performed At: [facility] [place], [place] [id number] Rush Farmer MD UU:[id number]          Radiology Studies: CT ABDOMEN PELVIS WO CONTRAST  Result Date: 12/30/2019 CLINICAL DATA:  29 year old female with acute abdomen pain. EXAM: CT ABDOMEN AND PELVIS WITHOUT CONTRAST TECHNIQUE: Multidetector CT imaging of the abdomen and pelvis was performed following the standard protocol without IV contrast. COMPARISON:  Abdominal radiograph dated 12/28/2019 chest CT dated 12/25/2019. FINDINGS: Evaluation of this exam is limited in the absence of intravenous contrast. Lower chest: Partially visualized small bilateral pleural effusions with associated partial compressive atelectasis of the lower lobes versus pneumonia, new since the recent chest CT. There is bibasilar linear and streaky densities which may represent edema, or pneumonia. Aspiration is not excluded. There is mild cardiomegaly. Trace pericardial effusion or mild pericardial thickening. Clinical correlation is recommended to evaluate for possibility of pericarditis. Partially visualized probable tip of the dialysis catheter at the cavoatrial junction. There is partial calcification of the mitral annulus. No intra-abdominal free air or free fluid. Hepatobiliary: There is enlargement of the left lobe of the liver which may represent early changes of cirrhosis. No intrahepatic biliary ductal dilatation. High attenuating content within the gallbladder may represent vicarious excretion of recently administered contrast versus sludge or small stones.  Pancreas: The pancreas is poorly visualized. There is mild peripancreatic stranding, likely related to systemic congestion or edema. Correlation with pancreatic enzymes recommended to exclude pancreatitis. Spleen: The spleen is grossly unremarkable.  Adrenals/Urinary Tract: Status post prior nephrectomy. Evaluation of the surgical bed is very limited due to edema. The adrenal glands are poorly visualized. The urinary bladder is collapsed. Stomach/Bowel: There is oral contrast throughout the small bowel and in the colon. No evidence of bowel obstruction. Air is noted in the distal esophagus which may represent reflux. An air-filled tubular structure in the right lower quadrant likely represents a normal appendix. Vascular/Lymphatic: The abdominal aorta is grossly unremarkable. There are scattered linear calcification along the IVC and right common iliac vein concerning for chronic venous thrombus or scarring. Evaluation of the vasculature is very limited on this noncontrast CT. No portal venous gas identified. Nodular densities along the aorta and retroperitoneum may represent vascular collaterals or mildly enlarged lymph nodes. Reproductive: The uterus is anteverted and grossly unremarkable. Metallic coil material noted in the left hemipelvis. The ovaries are not well visualized. Other: There is intramuscular hematoma involving the psoas muscles bilaterally, right greater left. The right hematoma measures approximately 4 x 5 cm in greatest axial dimensions and 7 cm in craniocaudal length. There is layering density within the hematoma which can be seen with bleeding associated with coagulation disorder. There is diffuse subcutaneous stranding of the pelvic wall. Musculoskeletal: No acute osseous pathology. Bilateral femoral head screws. IMPRESSION: 1. Intramuscular hematoma involving the psoas muscles bilaterally, right greater left. There is layering density within the hematoma which can be seen with coagulopathy. 2. No bowel obstruction. Normal appendix. 3. Partially visualized small bilateral pleural effusions with associated partial compressive atelectasis of the lower lobes versus pneumonia, new since the recent chest CT. Aspiration is not excluded. 4. Mild  cardiomegaly. 5. Aortic Atherosclerosis (ICD10-I70.0). 6. Diffuse calcification of IVC and additional findings as above. These results were called by telephone at the time of interpretation on 12/30/2019 at 8:28 pm to nurse,Hagerman, who verbally acknowledged these results. Electronically Signed   By: Anner Crete M.D.   On: 12/30/2019 20:28   DG HIP UNILAT WITH PELVIS 1V RIGHT  Result Date: 12/30/2019 CLINICAL DATA:  Right hip pain for 2 days.  No known injury. EXAM: DG HIP (WITH OR WITHOUT PELVIS) 1V RIGHT COMPARISON:  None. FINDINGS: No acute bony or joint abnormality is identified. Single screws are in place across both femoral necks. The screws are intact without complicating feature. No avascular necrosis of the femoral heads. Mild bilateral acetabular dysplasia is more notable on the right. Joint spaces are preserved. Vascular coils and surgical clips in the left pelvis are seen. IMPRESSION: No acute abnormality.  Negative for arthropathy. Mild bilateral acetabular dysplasia. Screws in place across the femoral necks bilaterally may be due for fixation of old slipped capital femoral epiphysis. Electronically Signed   By: Inge Rise M.D.   On: 12/30/2019 14:16        Scheduled Meds:  calcium acetate  1,334 mg Oral TID WC   calcium carbonate  1 tablet Oral Daily   Chlorhexidine Gluconate Cloth  6 each Topical Q0600   Chlorhexidine Gluconate Cloth  6 each Topical Q0600   Chlorhexidine Gluconate Cloth  6 each Topical Q0600   cholecalciferol  2,000 Units Oral Daily   doxercalciferol  1 mcg Intravenous Q M,W,F-HD   famotidine  20 mg Oral Daily   folic acid  1 mg Oral Daily   multivitamin  1 tablet Oral QHS   predniSONE  20 mg Oral Q breakfast   rosuvastatin  10 mg Oral Daily   senna-docusate  1 tablet Oral BID   sodium chloride flush  3 mL Intravenous Q12H   Warfarin - Pharmacist Dosing Inpatient   Does not apply q1600   Continuous Infusions:    LOS: 6 days     Time spent: 35 minutes.     Elmarie Shiley, MD [facility]   If 7PM-7AM, please contact night-coverage [email or web address]  12/31/2019, 9:34 AM

## 2019-12-31 NOTE — Plan of Care (Signed)
  Problem: Education: Goal: Knowledge of General Education information will improve Description: Including pain rating scale, medication(s)/side effects and non-pharmacologic comfort measures Outcome: Progressing   Problem: Health Behavior/Discharge Planning: Goal: Ability to manage health-related needs will improve Outcome: Progressing   Problem: Clinical Measurements: Goal: Will remain free from infection Outcome: Progressing   Problem: Clinical Measurements: Goal: Diagnostic test results will improve Outcome: Progressing   Problem: Clinical Measurements: Goal: Respiratory complications will improve Outcome: Progressing   Problem: Nutrition: Goal: Adequate nutrition will be maintained Outcome: Progressing   Problem: Coping: Goal: Level of anxiety will decrease Outcome: Progressing   Problem: Pain Managment: Goal: General experience of comfort will improve Outcome: Progressing

## 2019-12-31 NOTE — Progress Notes (Signed)
MD was updated about pain increasing order to check CBC and continue to give PRN pain meds. Patient is a difficult stick this becoming a challenge to obtain labs and after a few attempts patient refuses. Phlebotomy unable to get at this time will attempts again.

## 2019-12-31 NOTE — Consult Note (Signed)
Hospital Consult    Reason for Consult: Evaluate SVC stent occlusion in setting of hemoptysis Referring Physician: Nephrology and hospitalist MRN #:  [id number]  History of Present Illness: This is a 29 y.o. 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  Patient reports that she had episodes of coughing and then had large-volume hemoptysis that prompted her presentation here to the hospital.  She ultimately underwent bronchoscopy and was found to have fresh blood in the right airway with inflammation and signs of bronchitis.  She denies ever having hemoptysis prior to this event.  This has since resolved while she has been on steroids.  She has been on dialysis since age of 3.  States she has been catheter dependent for the last 4 years.  She had a failed attempted access placed in the left arm and states the fistula was never created but they did explore her arm with surgery.  States she is adamant against any further access at this time and wants to remain catheter dependent.  In regards to her SVC stent she is unclear as to when and where this was placed.  She is unclear about when it may have occluded.  She denies any worsening facial swelling or upper extremity swelling and feels that she is at her baseline.  Past Medical History:  Diagnosis Date  . Anemia   . Dyslipidemia 12/25/2019  . ESRD (end stage renal disease) on dialysis ([place]) 08/10/2008   MWF; [facility]; [place]. 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 on HD since 2010. TDC dependent for several yrs.    . Focal segmental glomerulosclerosis   . GERD (gastroesophageal reflux disease)   . Hemoptysis 12/2019  . Hyperlipidemia   . Hypertension   .  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 Secondary hyperparathyroidism ([place])   . SIRS (systemic inflammatory response syndrome) ([place]) 11/23/2016    Past Surgical History:  Procedure Laterality Date  . BRONCHIAL WASHINGS  12/26/2019   Procedure: BRONCHIAL WASHINGS;  Surgeon: Laurin Coder, MD;  Location: [facility];  Service: Pulmonary;;  . COLON SURGERY  ~ 2011/2012   "for bowel obstruction"  . INSERTION OF DIALYSIS CATHETER Left ~ 2016   chest  . KIDNEY TRANSPLANT  2007  . VIDEO BRONCHOSCOPY N/A 12/26/2019   Procedure: VIDEO BRONCHOSCOPY WITHOUT FLUORO;  Surgeon: Laurin Coder, MD;  Location: [facility];  Service: Pulmonary;  Laterality: N/A;    Allergies  Allergen Reactions  . Cefazolin Anaphylaxis  . Ferrlecit [Na Ferric Gluc Cplx In Sucrose] Anaphylaxis    Anaphylactic shock/severe angioedema on 08/09/15 related to IV iron Ferrlecit IV)  required intubation for respiratory failure  . Cyclosporine Other (See Comments)    Loss of motor skills  . Lactose Intolerance (Gi) Nausea Only    Stomach pains also  . Latex Rash  . Vancomycin Rash    Prior to Admission medications   Medication Sig Start Date End Date Taking? Authorizing Provider  Calcium Acetate 668 (169 Ca) MG TABS Take 1,336 mg by mouth 3 (three) times daily with meals.  11/29/19  Yes [provider]  docusate sodium (COLACE) 100 MG capsule Take 200 mg by mouth daily as needed for mild constipation.  Yes [provider]  EPINEPHrine 0.3 mg/0.3 mL IJ SOAJ injection Inject 0.3 mLs (0.3 mg total) into the muscle once. Patient taking differently: Inject 0.3 mg into the muscle once as needed (FOR ANAPHYLAXIS).  08/16/15  Yes Vira Blanco, MD  famotidine (PEPCID) 20 MG tablet Take 20 mg by mouth at bedtime.    Yes [provider]  ferrous sulfate 325 (65 FE) MG EC tablet Take 325 mg by mouth at bedtime. 03/18/18  Yes [provider]  rosuvastatin (CRESTOR) 10 MG  tablet Take 10 mg by mouth every evening.    Yes [provider]  sevelamer carbonate (RENVELA) 800 MG tablet Take 2,400 mg by mouth 3 (three) times daily with meals.   Yes [provider]  warfarin (COUMADIN) 2 MG tablet Take 2 mg by mouth at bedtime. Take with 5 mg tablet to equal 7mg  by mouth daily   Yes [provider]  warfarin (COUMADIN) 5 MG tablet Take 5 mg by mouth at bedtime. Take with 2mg  tablet to equal 7mg  by mouth daily   Yes [provider]  benzonatate (TESSALON) 100 MG capsule Take 1 capsule (100 mg total) by mouth every 8 (eight) hours. 12/24/19   Suzy Bouchard, PA-C  diphenhydrAMINE (BENADRYL) 25 mg capsule Take 1 capsule (25 mg total) by mouth every 6 (six) hours as needed for allergies. Patient not taking: Reported on 12/25/2019 08/14/15   Jonetta Osgood, MD    Social History   Socioeconomic History  . Marital status: Single    Spouse name: Not on file  . Number of children: Not on file  . Years of education: Not on file  . Highest education level: Not on file  Occupational History  . Occupation: Control and instrumentation engineer  Tobacco Use  . Smoking status: Never Smoker  . Smokeless tobacco: Never Used  Vaping Use  . Vaping Use: Never used  Substance and Sexual Activity  . Alcohol use: No  . Drug use: No  . Sexual activity: Not Currently  Other Topics Concern  . Not on file  Social History Narrative  . Not on file   Social Determinants of Health   Financial Resource Strain:   . Difficulty of Paying Living Expenses:   Food Insecurity:   . Worried About Charity fundraiser in the Last Year:   . Arboriculturist in the Last Year:   Transportation Needs:   . Film/video editor (Medical):   Marland Kitchen Lack of Transportation (Non-Medical):   Physical Activity:   . Days of Exercise per Week:   . Minutes of Exercise per Session:   Stress:   . Feeling of Stress :   Social Connections:   . Frequency of Communication with Friends and  Family:   . Frequency of Social Gatherings with Friends and Family:   . Attends Religious Services:   . Active Member of Clubs or Organizations:   . Attends Archivist Meetings:   Marland Kitchen Marital Status:   Intimate Partner Violence:   . Fear of Current or Ex-Partner:   . Emotionally Abused:   Marland Kitchen Physically Abused:   . Sexually Abused:      Family History  Problem Relation Age of Onset  . Kidney disease Neg Hx     ROS: [x]  Positive   [ ]  Negative   [ ]  All sytems reviewed and are negative  Cardiovascular: []  chest pain/pressure []  palpitations []  SOB lying flat []  DOE []  pain in  legs while walking []  pain in legs at rest []  pain in legs at night []  non-healing ulcers []  hx of DVT []  swelling in legs  Pulmonary: []  productive cough []  asthma/wheezing []  home O2  Neurologic: []  weakness in []  arms []  legs []  numbness in []  arms []  legs []  hx of CVA []  mini stroke [] difficulty speaking or slurred speech []  temporary loss of vision in one eye []  dizziness  Hematologic: []  hx of cancer []  bleeding problems []  problems with blood clotting easily  Endocrine:   []  diabetes []  thyroid disease  GI []  vomiting blood []  blood in stool  GU: []  CKD/renal failure []  HD--[]  M/W/F or []  T/T/S []  burning with urination []  blood in urine  Psychiatric: []  anxiety []  depression  Musculoskeletal: []  arthritis []  joint pain  Integumentary: []  rashes []  ulcers  Constitutional: []  fever []  chills   Physical Examination  Vitals:   12/31/19 0900 12/31/19 1041  BP:  (!) 120/97  Pulse:    Resp: 10   Temp:    SpO2:     Body mass index is 21.13 kg/m.  General:  NAD Gait: Not observed HENT: WNL, normocephalic, no significant facial edema Pulmonary: normal non-labored breathing, without Rales, rhonchi,  wheezing Cardiac: regular, without  Murmurs, rubs or gallops Abdomen:  soft, NT/ND, no masses Vascular Exam/Pulses: Palpable radial and brachial  pulses upper extremities No significant facial or upper extremity edema Extremities: without ischemic changes, without Gangrene , without cellulitis; without open wounds;  Musculoskeletal: no muscle wasting or atrophy  Neurologic: A&O X 3; Appropriate Affect ; SENSATION: normal; MOTOR FUNCTION:  moving all extremities equally. Speech is fluent/normal   CBC    Component Value Date/Time   WBC 3.2 (L) 12/31/2019 0507   RBC 3.61 (L) 12/31/2019 0507   HGB 10.7 (L) 12/31/2019 0507   HGB 10.5 (L) 12/24/2016 0820   HCT 34.1 (L) 12/31/2019 0507   HCT 32.7 (L) 12/24/2016 0820   PLT 172 12/31/2019 0507   PLT 110 Platelet count consistent in citrate (L) 12/24/2016 0820   MCV 94.5 12/31/2019 0507   MCV 98 12/24/2016 0820   MCH 29.6 12/31/2019 0507   MCHC 31.4 12/31/2019 0507   RDW 15.4 12/31/2019 0507   RDW 17.4 (H) 12/24/2016 0820   LYMPHSABS 0.5 (L) 12/31/2019 0507   LYMPHSABS 0.4 (L) 12/24/2016 0820   MONOABS 0.3 12/31/2019 0507   EOSABS 0.0 12/31/2019 0507   EOSABS 0.2 12/24/2016 0820   BASOSABS 0.0 12/31/2019 0507   BASOSABS 0.0 12/24/2016 0820    BMET    Component Value Date/Time   NA 133 (L) 12/29/2019 2320   K 5.3 (H) 12/29/2019 2320   CL 98 12/29/2019 2320   CO2 23 12/29/2019 2320   GLUCOSE 99 12/29/2019 2320   BUN 38 (H) 12/29/2019 2320   CREATININE 10.61 (H) 12/29/2019 2320   CALCIUM 8.2 (L) 12/29/2019 2320   GFRNONAA 4 (L) 12/29/2019 2320   GFRAA 5 (L) 12/29/2019 2320    COAGS: Lab Results  Component Value Date   INR 2.1 (H) 12/31/2019   INR 2.0 (H) 12/30/2019   INR 1.5 (H) 12/28/2019     Non-Invasive Vascular Imaging:    I reviewed CT PE study and she has a SVC stent that extends into the left innominate vein and her tunneled left IJ catheter traverses through the stent into the right atrium.  Difficult to evaluate on the study but no obvious flow of contrast within the stent.  ASSESSMENT/PLAN: This is a 29 y.o. female that vascular surgery has been  asked to evaluate for SVC stent occlusion in setting of hemoptysis.    Ultimately I agree with Dr. Nicole Cella previous recommendation on admission for no intervention on her SVC stent occlusion.  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  She is not having any significant upper extremity facial or arm swelling to suggest SVC syndrome symptoms that would warrant intervention from my standpoint.  I reviewed the nephrology concerns about collaterals from SVC stent occlusion possibly contributing to her hemoptysis but she had blood in her right airway with bronchitis and this hemoptysis has resolved with steroids and at this time that seems to be the source.  I think the only role for trying to intervene on her SVC stent occlusion would be if we want to attempt future dialysis access in her upper extremities and she is adamantly against that.  She states she wants to remain catheter dependent.  I think if you relign the SVC stent and got it open and then put a tunneled catheter back across it it will have very poor durability.    Marty Heck, MD [facility] Office: [phone]

## 2019-12-31 NOTE — Progress Notes (Signed)
VASCULAR LAB PRELIMINARY  PRELIMINARY  PRELIMINARY  PRELIMINARY  Right lower extremity venous duplex completed.    Preliminary report:  See CV proc for preliminary results.  Messaged Dr. Tyrell Antonio with results.  Also gave report to Earlie Raveling, RN.  Alasia Enge, RVT 12/31/2019, 2:00 PM

## 2020-01-01 LAB — RENAL FUNCTION PANEL
Albumin: 3.5 g/dL (ref 3.5–5.0)
Anion gap: 15 (ref 5–15)
BUN: 41 mg/dL — ABNORMAL HIGH (ref 6–20)
CO2: 19 mmol/L — ABNORMAL LOW (ref 22–32)
Calcium: 8 mg/dL — ABNORMAL LOW (ref 8.9–10.3)
Chloride: 92 mmol/L — ABNORMAL LOW (ref 98–111)
Creatinine, Ser: 11.16 mg/dL — ABNORMAL HIGH (ref 0.44–1.00)
GFR calc Af Amer: 5 mL/min — ABNORMAL LOW (ref >60)
GFR calc non Af Amer: 4 mL/min — ABNORMAL LOW (ref >60)
Glucose, Bld: 130 mg/dL — ABNORMAL HIGH (ref 70–99)
Phosphorus: 3.7 mg/dL (ref 2.5–4.6)
Potassium: 5 mmol/L (ref 3.5–5.1)
Sodium: 126 mmol/L — ABNORMAL LOW (ref 135–145)

## 2020-01-01 LAB — PROTIME-INR
INR: 2.6 — ABNORMAL HIGH (ref 0.8–1.2)
Prothrombin Time: 26.6 seconds — ABNORMAL HIGH (ref 11.4–15.2)

## 2020-01-01 MED ORDER — DIPHENHYDRAMINE HCL 50 MG/ML IJ SOLN
INTRAMUSCULAR | Status: AC
  Filled 2020-01-01: qty 1

## 2020-01-01 MED ORDER — HEPARIN SODIUM (PORCINE) 1000 UNIT/ML IJ SOLN
INTRAMUSCULAR | Status: AC
  Administered 2020-01-02: 3000 [IU]
  Filled 2020-01-01: qty 3

## 2020-01-01 MED ORDER — WARFARIN SODIUM 5 MG PO TABS
7.0000 mg | ORAL_TABLET | Freq: Once | ORAL | Status: AC
  Administered 2020-01-01: 7 mg via ORAL
  Filled 2020-01-01: qty 1

## 2020-01-01 MED ORDER — HYDROMORPHONE HCL 1 MG/ML IJ SOLN
INTRAMUSCULAR | Status: AC
  Filled 2020-01-01: qty 0.5

## 2020-01-01 MED ORDER — CHLORHEXIDINE GLUCONATE CLOTH 2 % EX PADS: 6.0000 | MEDICATED_PAD | Freq: Every day | CUTANEOUS | Status: DC

## 2020-01-01 MED ORDER — DIPHENHYDRAMINE HCL 50 MG/ML IJ SOLN
25.0000 mg | INTRAMUSCULAR | Status: AC | PRN
  Administered 2020-01-01: 25 mg via INTRAVENOUS

## 2020-01-01 MED ORDER — DOXERCALCIFEROL 4 MCG/2ML IV SOLN
INTRAVENOUS | Status: AC
  Filled 2020-01-01: qty 2

## 2020-01-01 NOTE — Progress Notes (Signed)
Patient ID: Crystal Dunlap, female   DOB: Mar 16, 1991, 29 y.o.   MRN: [id number] Request received for possible IVC filter placement in patient.  History and imaging studies have been reviewed by Dr.Shick.  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 Please contact Dr. Annamaria Boots at [phone] or [phone] with any additional questions.

## 2020-01-01 NOTE — Plan of Care (Signed)
  Problem: Education: Goal: Knowledge of General Education information will improve Description: Including pain rating scale, medication(s)/side effects and non-pharmacologic comfort measures Outcome: Progressing   Problem: Health Behavior/Discharge Planning: Goal: Ability to manage health-related needs will improve Outcome: Progressing   Problem: Clinical Measurements: Goal: Ability to maintain clinical measurements within normal limits will improve Outcome: Progressing   Problem: Clinical Measurements: Goal: Diagnostic test results will improve Outcome: Progressing   Problem: Nutrition: Goal: Adequate nutrition will be maintained Outcome: Progressing   Problem: Coping: Goal: Level of anxiety will decrease Outcome: Progressing   Problem: Pain Managment: Goal: General experience of comfort will improve Outcome: Progressing

## 2020-01-01 NOTE — Progress Notes (Signed)
IP PROGRESS NOTE  Subjective:   Crystal Dunlap reports improvement in right leg pain.  No new complaint.  Objective: Vital signs in last 24 hours: Blood pressure 140/89, pulse (!) 59, temperature 98.2 F (36.8 C), temperature source Oral, resp. rate 19, height 5\' 2"  (1.575 m), weight 117 lb 1 oz (53.1 kg), last menstrual period 10/28/2019, SpO2 95 %.  Intake/Output from previous day: 07/04 0701 - 07/05 0700 In: 449 [P.O.:440; I.V.:9] Out: 0   Physical Exam:   Extremities: Minimal enlargement of the right compared to the left thigh, mild tenderness of the right thigh Musculoskeletal: No tenderness at the lower back  Portacath/PICC-without erythema  Lab Results: Recent Labs    12/29/19 2320 12/29/19 2320 12/31/19 0507 12/31/19 1925  WBC 2.8*  --  3.2*  --   HGB 10.9*   < > 10.7* 11.2*  HCT 36.4   < > 34.1* 34.5*  PLT 148*  --  172  --    < > = values in this interval not displayed.    BMET Recent Labs    12/29/19 2320  NA 133*  K 5.3*  CL 98  CO2 23  GLUCOSE 99  BUN 38*  CREATININE 10.61*  CALCIUM 8.2*    No results found for: CEA1  Studies/Results: CT ABDOMEN PELVIS WO CONTRAST  Result Date: 12/30/2019 CLINICAL DATA:  29 year old female with acute abdomen pain. EXAM: CT ABDOMEN AND PELVIS WITHOUT CONTRAST TECHNIQUE: Multidetector CT imaging of the abdomen and pelvis was performed following the standard protocol without IV contrast. COMPARISON:  Abdominal radiograph dated 12/28/2019 chest CT dated 12/25/2019. FINDINGS: Evaluation of this exam is limited in the absence of intravenous contrast. Lower chest: Partially visualized small bilateral pleural effusions with associated partial compressive atelectasis of the lower lobes versus pneumonia, new since the recent chest CT. There is bibasilar linear and streaky densities which may represent edema, or pneumonia. Aspiration is not excluded. There is mild cardiomegaly. Trace pericardial effusion or mild pericardial  thickening. Clinical correlation is recommended to evaluate for possibility of pericarditis. Partially visualized probable tip of the dialysis catheter at the cavoatrial junction. There is partial calcification of the mitral annulus. No intra-abdominal free air or free fluid. Hepatobiliary: There is enlargement of the left lobe of the liver which may represent early changes of cirrhosis. No intrahepatic biliary ductal dilatation. High attenuating content within the gallbladder may represent vicarious excretion of recently administered contrast versus sludge or small stones. Pancreas: The pancreas is poorly visualized. There is mild peripancreatic stranding, likely related to systemic congestion or edema. Correlation with pancreatic enzymes recommended to exclude pancreatitis. Spleen: The spleen is grossly unremarkable. Adrenals/Urinary Tract: Status post prior nephrectomy. Evaluation of the surgical bed is very limited due to edema. The adrenal glands are poorly visualized. The urinary bladder is collapsed. Stomach/Bowel: There is oral contrast throughout the small bowel and in the colon. No evidence of bowel obstruction. Air is noted in the distal esophagus which may represent reflux. An air-filled tubular structure in the right lower quadrant likely represents a normal appendix. Vascular/Lymphatic: The abdominal aorta is grossly unremarkable. There are scattered linear calcification along the IVC and right common iliac vein concerning for chronic venous thrombus or scarring. Evaluation of the vasculature is very limited on this noncontrast CT. No portal venous gas identified. Nodular densities along the aorta and retroperitoneum may represent vascular collaterals or mildly enlarged lymph nodes. Reproductive: The uterus is anteverted and grossly unremarkable. Metallic coil material noted in the left hemipelvis. The  ovaries are not well visualized. Other: There is intramuscular hematoma involving the psoas muscles  bilaterally, right greater left. The right hematoma measures approximately 4 x 5 cm in greatest axial dimensions and 7 cm in craniocaudal length. There is layering density within the hematoma which can be seen with bleeding associated with coagulation disorder. There is diffuse subcutaneous stranding of the pelvic wall. Musculoskeletal: No acute osseous pathology. Bilateral femoral head screws. IMPRESSION: 1. Intramuscular hematoma involving the psoas muscles bilaterally, right greater left. There is layering density within the hematoma which can be seen with coagulopathy. 2. No bowel obstruction. Normal appendix. 3. Partially visualized small bilateral pleural effusions with associated partial compressive atelectasis of the lower lobes versus pneumonia, new since the recent chest CT. Aspiration is not excluded. 4. Mild cardiomegaly. 5. Aortic Atherosclerosis (ICD10-I70.0). 6. Diffuse calcification of IVC and additional findings as above. These results were called by telephone at the time of interpretation on 12/30/2019 at 8:28 pm to nurse,Crystal Dunlap, who verbally acknowledged these results. Electronically Signed   By: Anner Crete M.D.   On: 12/30/2019 20:28   DG HIP UNILAT WITH PELVIS 1V RIGHT  Result Date: 12/30/2019 CLINICAL DATA:  Right hip pain for 2 days.  No known injury. EXAM: DG HIP (WITH OR WITHOUT PELVIS) 1V RIGHT COMPARISON:  None. FINDINGS: No acute bony or joint abnormality is identified. Single screws are in place across both femoral necks. The screws are intact without complicating feature. No avascular necrosis of the femoral heads. Mild bilateral acetabular dysplasia is more notable on the right. Joint spaces are preserved. Vascular coils and surgical clips in the left pelvis are seen. IMPRESSION: No acute abnormality.  Negative for arthropathy. Mild bilateral acetabular dysplasia. Screws in place across the femoral necks bilaterally may be due for fixation of old slipped capital femoral  epiphysis. Electronically Signed   By: Inge Rise M.D.   On: 12/30/2019 14:16   VAS [place] LOWER EXTREMITY VENOUS (DVT)  Result Date: 12/31/2019  Lower Venous DVTStudy Indications: Pain, and Swelling. Other Indications: SVC stent occlusion. Risk Factors: History of PE and DVT X 2 ESRD, on dialysis since the age of 50, retroperitoneal hemorrhage Anticoagulation: Coumadin. Limitations: Bowel gas, pain in the abdomen secondary to intramuscular hematoma involving the psoas muscles. Comparison Study: No prior study on file Performing Technologist: Crystal Dunlap RVS  Examination Guidelines: A complete evaluation includes B-mode imaging, spectral Doppler, color Doppler, and power Doppler as needed of all accessible portions of each vessel. Bilateral testing is considered an integral part of a complete examination. Limited examinations for reoccurring indications may be performed as noted. The reflux portion of the exam is performed with the patient in reverse Trendelenburg.  +---------+---------------+---------+-----------+----------+--------------+ RIGHT    CompressibilityPhasicitySpontaneityPropertiesThrombus Aging +---------+---------------+---------+-----------+----------+--------------+ CFV      Partial        No       No                   Acute          +---------+---------------+---------+-----------+----------+--------------+ SFJ      Full                                                        +---------+---------------+---------+-----------+----------+--------------+ FV Prox  Partial  Acute          +---------+---------------+---------+-----------+----------+--------------+ FV Mid   Partial                                      Acute          +---------+---------------+---------+-----------+----------+--------------+ FV DistalPartial        No       No                   Acute           +---------+---------------+---------+-----------+----------+--------------+ PFV      Full                                                        +---------+---------------+---------+-----------+----------+--------------+ POP      None           No       No                   Acute          +---------+---------------+---------+-----------+----------+--------------+ PTV      None                                         Acute          +---------+---------------+---------+-----------+----------+--------------+ PERO     None                                         Acute          +---------+---------------+---------+-----------+----------+--------------+ Gastroc  None                                         Acute          +---------+---------------+---------+-----------+----------+--------------+ EIV                                                   Not visualized +---------+---------------+---------+-----------+----------+--------------+ CIV                                                   Not visualized +---------+---------------+---------+-----------+----------+--------------+ Patient is extremely tender at groin and into abdomen secondary to psoas hematoma. Unable to visualize external or common iliac veins due to pain and bowel gas.  +----+---------------+---------+-----------+----------+--------------+ LEFTCompressibilityPhasicitySpontaneityPropertiesThrombus Aging +----+---------------+---------+-----------+----------+--------------+ CFV Full           Yes      Yes                                 +----+---------------+---------+-----------+----------+--------------+     Summary: RIGHT: - Findings consistent with acute  deep vein thrombosis involving the right common femoral vein, right femoral vein, right popliteal vein, right posterior tibial veins, right peroneal veins, and right gastrocnemius veins.  LEFT: - No evidence of common femoral vein  obstruction.  *See table(s) above for measurements and observations.    Preliminary     Medications: I have reviewed the patient's current medications.  Assessment/Plan: 1. 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  Admission with hemoptysis 12/24/2019-bronchoscopy suggestive of bronchitis, resolved 4.  Retroperitoneal-bilateral psoas hematomas, likely related to heparin anticoagulation (supratherapeutic at times) during this hospital admission 5.  Acute right lower extremity DVT on Doppler 12/31/2019 6.  Pain secondary to retroperitoneal hemorrhage and right leg DVT 7.  SVC stent occlusion 8.  Right lung nodule on chest CT 12/25/2019-to be followed by pulmonary medicine as an outpatient 9.  History of neutropenia-evaluated [facility]  Ms. Malatesta has no new complaint.  The right leg pain has improved.  The PT/INR is pending today.  It is not clear that she has failed Coumadin anticoagulation.  Indefinite Coumadin anticoagulation was recommended when she was evaluated at [facility] several years ago.  I recommend placing a retrievable IVC filter with the recent psoas hemorrhage.  She can continue Coumadin anticoagulation with a goal INR of 2.5.  I recommend changing to apixaban if she has a documented thrombosis while on therapeutic Coumadin.   LOS: 7 days   Betsy Coder, MD   01/01/2020, 7:42 AM

## 2020-01-01 NOTE — Progress Notes (Signed)
[place] for heparin Indication: history of pulmonary embolus and DVT  Allergies  Allergen Reactions  . Cefazolin Anaphylaxis  . Ferrlecit [Na Ferric Gluc Cplx In Sucrose] Anaphylaxis    Anaphylactic shock/severe angioedema on 08/09/15 related to IV iron Ferrlecit IV)  required intubation for respiratory failure  . Cyclosporine Other (See Comments)    Loss of motor skills  . Lactose Intolerance (Gi) Nausea Only    Stomach pains also  . Latex Rash  . Vancomycin Rash    Patient Measurements: Height: 5\' 2"  (157.5 cm) Weight: 53.1 kg (117 lb 1 oz) IBW/kg (Calculated) : 50.1 Heparin Dosing Weight: 48.6 kg   Vital Signs: Temp: 98.2 F (36.8 C) (07/05 1200) Temp Source: Oral (07/05 1200) BP: 118/85 (07/05 1218) Pulse Rate: 62 (07/05 1218)  Labs: Recent Labs    12/29/19 2320 12/29/19 2320 12/30/19 1130 12/31/19 0249 12/31/19 0507 12/31/19 1925  HGB 10.9*   < >  --   --  10.7* 11.2*  HCT 36.4  --   --   --  34.1* 34.5*  PLT 148*  --   --   --  172  --   LABPROT  --   --  21.9* 22.5*  --   --   INR  --   --  2.0* 2.1*  --   --   HEPARINUNFRC  --   --  0.32  --   --   --   CREATININE 10.61*  --   --   --   --   --    < > = values in this interval not displayed.    Estimated Creatinine Clearance: 6.1 mL/min (A) (by C-G formula based on SCr of 10.61 mg/dL (H)).  Assessment: 29 y.o. female with h/o PE/DVT for anticoagulation. Found to have IM hematoma involving the psoas muscle (R>L). Duplex finding acute R DVT.  Evaluated by MD and plan for possible IVC filter and continue warfarin at this time.  Pt refusing labs gain this morning, INR yesterday was 2.1 - will continue home dose in absence of new labs.  **Home warfarin dose 7mg  daily  Goal of Therapy:  INR 2-3 - aiming for lower end of the goal  Heparin level 0.3-0.5 units/ml Monitor platelets by anticoagulation protocol: Yes   Plan:  -Warfarin 7 mg PO x1 tonight -Daily  INR   Arrie Senate, PharmD, BCPS Clinical Pharmacist [phone] Please check AMION for all [place] numbers 01/01/2020

## 2020-01-01 NOTE — Progress Notes (Signed)
[facility] NEPHROLOGY PROGRESS NOTE  Assessment/ Plan: Pt is a 29 y.o. 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  OP HD: [facility] MWF   3.5h  350/800  47kg  2/2.5 bath  P2  Hep 3000  TDC  hect 1 ug tiw  # Hemoptysis/ bronchitis. Hx PE/DVT on Coumadin. CTA no infiltrates. Bronchoscopy showed bronchitic changes R side, started on pred 20mg  / d per CCM.  SVC syndrome has been reported to possibly cause hemoptysis due to dilatation of bronchial veins.   Seen by vascular team thought to be pulmonary source.   # ESRD -HD MWF. 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 ESRD since age 65 in 47. HD today via [facility].   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   # Hypertension/volume: BP acceptable.  Off losartan.  UF as tolerated.  # Anemia due to blood loss and ESRD: Hgb stable.  Not on ESA. Follow trends.  #Secondary hyperparathyroidism:Calcium and phosphorus level acceptable.  Continue PhosLo and vitamin D.  Monitor lab.     # Hx PE/DVT - back on coumadin, INR 2.1.  Seen by hematologist, plan for IVC filter noted.  Subjective: Seen and examined at bedside.  Denies hemoptysis or cough.  Reports feeling good.  Denies nausea vomiting chest pain shortness of breath. Objective Vital signs in last 24 hours: Vitals:   12/31/19 2004 01/01/20 0002 01/01/20 0342 01/01/20 0723  BP: (!) 135/97 116/83 (!) 121/97 140/89  Pulse: (!) 58 69 (!) 57 (!) 59  Resp: 16 15 10 19   Temp: 99.3 F (37.4 C) 97.8 F (36.6 C) 97.8 F (36.6 C) 98.2 F (36.8 C)  TempSrc: Oral Oral Oral Oral   SpO2: 96% 97% 97% 95%  Weight:   53.1 kg   Height:       Weight change: 0.7 kg  Intake/Output Summary (Last 24 hours) at 01/01/2020 0821 Last data filed at 01/01/2020 0002 Gross per 24 hour  Intake 349 ml  Output 0 ml  Net 349 ml       Labs: Basic Metabolic Panel: Recent Labs  Lab 12/27/19 1200 12/28/19 0307 12/29/19 2320  NA 132* 136 133*  K 4.5 4.5 5.3*  CL 97* 101 98  CO2 22 25 23   GLUCOSE 199* 93 99  BUN 41* 24* 38*  CREATININE 10.76* 6.98* 10.61*  CALCIUM 6.6* 6.5* 8.2*  PHOS 4.5 3.6 4.1   Liver Function Tests: Recent Labs  Lab 12/27/19 1200 12/28/19 0307 12/29/19 2320  AST 24 24 26   ALT 13 13 15   ALKPHOS 86 78 83  BILITOT 0.3 0.6 0.6  PROT 7.2 6.8 6.9  ALBUMIN 3.6 3.6 3.7   No results for input(s): LIPASE, AMYLASE in the last 168 hours. No results for input(s): AMMONIA in the last 168 hours. CBC: Recent Labs  Lab 12/26/19 0303 12/26/19 0303 12/27/19 1200 12/27/19 1200 12/28/19 0307 12/28/19 0307 12/29/19 2320 12/31/19 0507 12/31/19 1925  WBC 2.9*   < > 3.8*   < > 2.8*  --  2.8* 3.2*  --   NEUTROABS  --   --   --   --   --   --   --  2.3  --   HGB 13.0   < > 11.2*   < > 11.1*   < > 10.9* 10.7* 11.2*  HCT 43.0   < > 37.3   < > 36.4   < > 36.4 34.1* 34.5*  MCV 98.2  --  97.1  --  95.5  --  96.3 94.5  --   PLT 170   < > 156   < > 159  --  148* 172  --    < > = values in this interval not displayed.   Cardiac Enzymes: No results for input(s): CKTOTAL, CKMB, CKMBINDEX, TROPONINI in the last 168 hours. CBG: Recent Labs  Lab 12/26/19 0738 12/26/19 0842  GLUCAP 68* 73    Iron Studies: No results for input(s): IRON, TIBC, TRANSFERRIN, FERRITIN in the last 72 hours. Studies/Results: CT ABDOMEN PELVIS WO CONTRAST  Result Date: 12/30/2019 CLINICAL DATA:  29 year old female with acute abdomen pain. EXAM: CT ABDOMEN AND PELVIS WITHOUT CONTRAST TECHNIQUE: Multidetector CT imaging of the abdomen and pelvis was performed following the standard  protocol without IV contrast. COMPARISON:  Abdominal radiograph dated 12/28/2019 chest CT dated 12/25/2019. FINDINGS: Evaluation of this exam is limited in the absence of intravenous contrast. Lower chest: Partially visualized small bilateral pleural effusions with associated partial compressive atelectasis of the lower lobes versus pneumonia, new since the recent chest CT. There is bibasilar linear and streaky densities which may represent edema, or pneumonia. Aspiration is not excluded. There is mild cardiomegaly. Trace pericardial effusion or mild pericardial thickening. Clinical correlation is recommended to evaluate for possibility of pericarditis. Partially visualized probable tip of the dialysis catheter at the cavoatrial junction. There is partial calcification of the mitral annulus. No intra-abdominal free air or free fluid. Hepatobiliary: There is enlargement of the left lobe of the liver which may represent early changes of cirrhosis. No intrahepatic biliary ductal dilatation. High attenuating content within the gallbladder may represent vicarious excretion of recently administered contrast versus sludge or small stones. Pancreas: The pancreas is poorly visualized. There is mild peripancreatic stranding, likely related to systemic congestion or edema. Correlation with pancreatic enzymes recommended to exclude pancreatitis. Spleen: The spleen is grossly unremarkable. Adrenals/Urinary Tract: Status post prior nephrectomy. Evaluation of the surgical bed is very limited due to edema. The adrenal glands are poorly visualized. The urinary bladder is collapsed. Stomach/Bowel: There is oral contrast throughout the small bowel and in the colon. No evidence of bowel obstruction. Air is noted in the distal esophagus which may represent reflux. An air-filled tubular structure in the right lower quadrant likely represents a normal appendix. Vascular/Lymphatic: The abdominal aorta is grossly unremarkable. There are  scattered linear calcification along the IVC and right common iliac vein concerning for chronic venous thrombus or scarring. Evaluation of the vasculature is very limited on this noncontrast CT. No portal venous gas identified. Nodular densities along the aorta and retroperitoneum may represent vascular collaterals or mildly enlarged lymph nodes. Reproductive: The uterus is anteverted and grossly unremarkable. Metallic coil material noted in the left hemipelvis. The ovaries are not well visualized. Other: There is intramuscular hematoma involving the psoas muscles bilaterally, right greater left. The right hematoma measures approximately 4 x 5 cm in greatest axial dimensions and 7 cm in craniocaudal length. There is layering density within the hematoma which can be seen with bleeding associated with coagulation disorder. There is diffuse subcutaneous stranding of the pelvic wall. Musculoskeletal: No acute osseous pathology. Bilateral femoral head screws. IMPRESSION: 1. Intramuscular hematoma involving  the psoas muscles bilaterally, right greater left. There is layering density within the hematoma which can be seen with coagulopathy. 2. No bowel obstruction. Normal appendix. 3. Partially visualized small bilateral pleural effusions with associated partial compressive atelectasis of the lower lobes versus pneumonia, new since the recent chest CT. Aspiration is not excluded. 4. Mild cardiomegaly. 5. Aortic Atherosclerosis (ICD10-I70.0). 6. Diffuse calcification of IVC and additional findings as above. These results were called by telephone at the time of interpretation on 12/30/2019 at 8:28 pm to nurse,Hagerman, who verbally acknowledged these results. Electronically Signed   By: Anner Crete M.D.   On: 12/30/2019 20:28   DG HIP UNILAT WITH PELVIS 1V RIGHT  Result Date: 12/30/2019 CLINICAL DATA:  Right hip pain for 2 days.  No known injury. EXAM: DG HIP (WITH OR WITHOUT PELVIS) 1V RIGHT COMPARISON:  None.  FINDINGS: No acute bony or joint abnormality is identified. Single screws are in place across both femoral necks. The screws are intact without complicating feature. No avascular necrosis of the femoral heads. Mild bilateral acetabular dysplasia is more notable on the right. Joint spaces are preserved. Vascular coils and surgical clips in the left pelvis are seen. IMPRESSION: No acute abnormality.  Negative for arthropathy. Mild bilateral acetabular dysplasia. Screws in place across the femoral necks bilaterally may be due for fixation of old slipped capital femoral epiphysis. Electronically Signed   By: Inge Rise M.D.   On: 12/30/2019 14:16   VAS [place] LOWER EXTREMITY VENOUS (DVT)  Result Date: 12/31/2019  Lower Venous DVTStudy Indications: Pain, and Swelling. Other Indications: SVC stent occlusion. Risk Factors: History of PE and DVT X 2 ESRD, on dialysis since the age of 26, retroperitoneal hemorrhage Anticoagulation: Coumadin. Limitations: Bowel gas, pain in the abdomen secondary to intramuscular hematoma involving the psoas muscles. Comparison Study: No prior study on file Performing Technologist: Sharion Dove RVS  Examination Guidelines: A complete evaluation includes B-mode imaging, spectral Doppler, color Doppler, and power Doppler as needed of all accessible portions of each vessel. Bilateral testing is considered an integral part of a complete examination. Limited examinations for reoccurring indications may be performed as noted. The reflux portion of the exam is performed with the patient in reverse Trendelenburg.  +---------+---------------+---------+-----------+----------+--------------+ RIGHT    CompressibilityPhasicitySpontaneityPropertiesThrombus Aging +---------+---------------+---------+-----------+----------+--------------+ CFV      Partial        No       No                   Acute          +---------+---------------+---------+-----------+----------+--------------+ SFJ       Full                                                        +---------+---------------+---------+-----------+----------+--------------+ FV Prox  Partial                                      Acute          +---------+---------------+---------+-----------+----------+--------------+ FV Mid   Partial  Acute          +---------+---------------+---------+-----------+----------+--------------+ FV DistalPartial        No       No                   Acute          +---------+---------------+---------+-----------+----------+--------------+ PFV      Full                                                        +---------+---------------+---------+-----------+----------+--------------+ POP      None           No       No                   Acute          +---------+---------------+---------+-----------+----------+--------------+ PTV      None                                         Acute          +---------+---------------+---------+-----------+----------+--------------+ PERO     None                                         Acute          +---------+---------------+---------+-----------+----------+--------------+ Gastroc  None                                         Acute          +---------+---------------+---------+-----------+----------+--------------+ EIV                                                   Not visualized +---------+---------------+---------+-----------+----------+--------------+ CIV                                                   Not visualized +---------+---------------+---------+-----------+----------+--------------+ Patient is extremely tender at groin and into abdomen secondary to psoas hematoma. Unable to visualize external or common iliac veins due to pain and bowel gas.  +----+---------------+---------+-----------+----------+--------------+  LEFTCompressibilityPhasicitySpontaneityPropertiesThrombus Aging +----+---------------+---------+-----------+----------+--------------+ CFV Full           Yes      Yes                                 +----+---------------+---------+-----------+----------+--------------+     Summary: RIGHT: - Findings consistent with acute deep vein thrombosis involving the right common femoral vein, right femoral vein, right popliteal vein, right posterior tibial veins, right peroneal veins, and right gastrocnemius veins.  LEFT: - No evidence of common femoral vein obstruction.  *See table(s) above for measurements and observations.    Preliminary     Medications:  Infusions:   Scheduled Medications: . amLODipine  2.5 mg Oral Daily  . calcium acetate  1,334 mg Oral TID WC  . calcium carbonate  1 tablet Oral Daily  . Chlorhexidine Gluconate Cloth  6 each Topical Q0600  . Chlorhexidine Gluconate Cloth  6 each Topical Q0600  . Chlorhexidine Gluconate Cloth  6 each Topical Q0600  . cholecalciferol  2,000 Units Oral Daily  . doxercalciferol  1 mcg Intravenous Q M,W,F-HD  . famotidine  20 mg Oral Daily  . folic acid  1 mg Oral Daily  . multivitamin  1 tablet Oral QHS  . predniSONE  20 mg Oral Q breakfast  . rosuvastatin  10 mg Oral Daily  . senna-docusate  1 tablet Oral BID  . sodium chloride flush  3 mL Intravenous Q12H  . Warfarin - Pharmacist Dosing Inpatient   Does not apply q1600    have reviewed scheduled and prn medications.  Physical Exam: General:NAD, comfortable Heart:RRR, s1s2 nl Lungs:clear b/l, no crackle Abdomen:soft, Non-tender, non-distended Extremities:No LE edema Dialysis Access: Left IJ TDC.  Alexande Sheerin Tanna Furry 01/01/2020,8:21 AM  LOS: 7 days  Pager: [phone]

## 2020-01-01 NOTE — Progress Notes (Signed)
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  She reports coughing up a lot of blood started the day prior to admission, prior red blood.  She had a cold and had been coughing and thinks this is what led to it.  She report low-grade fever.  No GI symptoms, no sick contacts.  She was feeling well other than mild cough onto the day prior to admission.  She is on Coumadin and her INR fluctuates. Patient had a CT angiogram which was negative for PE with show obstruction of her SVC stent.  Dr. Doren Custard on-call vascular surgery advised no intervention at this time. Consulted, patient underwent bronchoscopy on 6/29.  Pulmonology think hemoptysis likely related to  Bronchitis.  Plan to continue with heparin and resume Coumadin today.  Assessment & Plan:   Principal Problem:   Hemoptysis Active Problems:   End stage renal disease on dialysis [facility])   Essential hypertension   History of pulmonary embolism   Occlusion of stent of peripheral artery   Dyslipidemia  1-Hemoptysis: Resolved Evaluated by CCM, underwent bronchoscopy cultures negative so far and  cytology negative. Pulmonologist think hemoptysis is related to acute bronchitis. Patient was started on Prednisone.  -Patient has significant pulmonary esophageal collateral with esophageal varices, with patient appears certain that this is about his rather than hematemesis Hb stable.   Intramuscular Hematoma,Psoas Muscle: Hb remain stable at 10.--11 Repeat hb tonight and tomorrow am.  IV dilaudid PRN for pain. Oral vicodin.  Suspect was related to heparin gtt.    Right LE DVT, acute femoral, tibial peroneal:  Hematology consulted.  Discussed with Dr  Benay Spice, plan to proceed with coumadin resumption and IVC filter placement.  Case reviewed by Dr Annamaria Boots Patient has chronic IVC occlusion, seen on prior imagine.   B/L lower atelectasis;  No fever.  Incentive spirometry.   Hypocalcemia; replaced. Resolved.  Started on oral supplement.   ESRD on hemodialysis MWF: Received Lokelma for hyperkalemia.   Hyperkalemia: Resolved  SVC stent occlusion: Per EMR discussion with Dr. Doren Custard by EDP.  Appears to be subacute versus chronic in nature.  No surgical intervention indicated at this time. Discussed with Renal, plan for formal Vascular consultation. No further intervention for SVC stent occlusion.   Hypotension: Resolved  Hyperlipidemia: Continue with rosuvastatin  History of PE times 2 and DVT: Patient need to be on anticoagulation indefinitely Continue with coumadin.   Leukopenia:  B12 normal.  Left side Abdominal pain; probably related to constipation. Sorbitol ordered.  Pain resolved after BM.   Right hip pain; psoas Hematoma.   Estimated body mass index is 21.41 kg/m as calculated from the following:   Height as of this encounter: _0  (1.575 m).   Weight as of this encounter: 53.1 kg.   DVT prophylaxis: Heparin drip Code Status: Full code Family Communication: Care discussed with patient and Mother who was at bedside.  Disposition Plan:  Status is: Inpatient  Remains inpatient appropriate because:Hemodynamically unstable   Dispo: The patient is from: Home              Anticipated d/c is to: Home              Anticipated d/c date is: 2 days  Patient currently is not medically stable to d/c.  Monitor hb in setting of iliopsoas hematoma       Consultants:   CCM  Nephrology  Procedures:   HD  Bronchoscopy  Antimicrobials:    Subjective: Report thigh, hip pain has improved. Feels pain is coming back, would like pain meds.  Explain to her impotance of getting lab work. Explain  importance of close follow up with her providers for INR check and Hb.   Objective: Vitals:   01/01/20 0723 01/01/20 0942 01/01/20 1200 01/01/20 1218  BP: 140/89 115/76 99/76 118/85  Pulse: (!) 59  60 62  Resp: _0 Temp: 98.2 F (36.8 C)  98.2 F (36.8 C)   TempSrc: Oral  Oral   SpO2: 95%  92% 96%  Weight:      Height:        Intake/Output Summary (Last 24 hours) at 01/01/2020 1233 Last data filed at 01/01/2020 0002 Gross per 24 hour  Intake 343 ml  Output 0 ml  Net 343 ml   Filed Weights   12/27/19 1455 12/31/19 0342 01/01/20 0342  Weight: 48.6 kg 52.4 kg 53.1 kg    Examination:  General exam: NAD Respiratory system: CTA Cardiovascular system: S 1, S 2 RRR Gastrointestinal system: BS present, soft, nt Central nervous system: Alert Extremities: Symmetric power.   Data Reviewed: I have personally reviewed following labs and imaging studies  CBC: Recent Labs  Lab 12/26/19 0303 12/26/19 0303 12/27/19 1200 12/28/19 0307 12/29/19 2320 12/31/19 0507 12/31/19 1925  WBC 2.9*  --  3.8* 2.8* 2.8* 3.2*  --   NEUTROABS  --   --   --   --   --  2.3  --   HGB 13.0   < > 11.2* 11.1* 10.9* 10.7* 11.2*  HCT 43.0   < > 37.3 36.4 36.4 34.1* 34.5*  MCV 98.2  --  97.1 95.5 96.3 94.5  --   PLT 170  --  156 159 148* 172  --    < > = values in this interval not displayed.   Basic Metabolic Panel: Recent Labs  Lab 12/26/19 0303 12/27/19 1200 12/28/19 0307 12/29/19 2320  NA 137 132* 136 133*  K 3.9 4.5 4.5 5.3*  CL 99 97* 101 98  CO2 _1 GLUCOSE 59* 199* 93 99  BUN 18 41* 24* 38*  CREATININE 6.76* 10.76* 6.98* 10.61*  CALCIUM 8.1* 6.6* 6.5* 8.2*  MG  --  2.3 2.1 2.5*  PHOS  --  4.5 3.6 4.1   GFR: Estimated Creatinine Clearance: 6.1 mL/min (A) (by C-G formula based on SCr of 10.61 mg/dL (H)). Liver Function Tests: Recent Labs  Lab 12/27/19 1200 12/28/19 0307 12/29/19 2320  AST _2 ALT _3 ALKPHOS 86 78 83  BILITOT 0.3 0.6 0.6  PROT  7.2 6.8 6.9  ALBUMIN 3.6 3.6 3.7   No results for input(s): LIPASE, AMYLASE in the last 168 hours. No results for input(s): AMMONIA in the last 168 hours. Coagulation Profile: Recent Labs  Lab 12/27/19 1200 12/28/19 0307 12/30/19 1130 12/31/19 0249  INR 1.7* 1.5* 2.0* 2.1*   Cardiac Enzymes: No results for input(s): CKTOTAL, CKMB, CKMBINDEX, TROPONINI in the last 168 hours. BNP (last 3 results) No results for input(s): PROBNP in the last 8760 hours. HbA1C: No results for input(s): HGBA1C in the last 72 hours. CBG: Recent Labs  Lab 12/26/19 [phone] 12/26/19 [phone]  GLUCAP 68* 73   Lipid Profile: No results for input(s): CHOL, HDL, LDLCALC, TRIG, CHOLHDL, LDLDIRECT in the last 72 hours. Thyroid Function Tests: No results for input(s): TSH, T4TOTAL, FREET4, T3FREE, THYROIDAB in the last 72 hours. Anemia Panel: No results for input(s): VITAMINB12, FOLATE, FERRITIN, TIBC, IRON, RETICCTPCT in the last 72 hours. Sepsis Labs: No results for input(s): PROCALCITON, LATICACIDVEN in the last 168 hours.  Recent Results (from the past 240 hour(s))  SARS Coronavirus 2 by RT PCR (hospital order, performed in [facility] hospital lab) Nasopharyngeal Nasopharyngeal Swab     Status: None   Collection Time: 12/24/19  9:12 PM   Specimen: Nasopharyngeal Swab  Result Value Ref Range Status   SARS Coronavirus 2 NEGATIVE NEGATIVE Final    Comment: (NOTE) SARS-CoV-2 target nucleic acids are NOT DETECTED.  The SARS-CoV-2 RNA is generally detectable in upper and lower respiratory specimens during the acute phase of infection. The lowest concentration of SARS-CoV-2 viral copies this assay can detect is 250 copies / mL. A negative result does not preclude SARS-CoV-2 infection and should not be used as the sole basis for treatment or other patient management decisions.  A negative result may occur with improper specimen collection / handling, submission of specimen other than nasopharyngeal swab,  presence of viral mutation(s) within the areas targeted by this assay, and inadequate number of viral copies (<250 copies / mL). A negative result must be combined with clinical observations, patient history, and epidemiological information.  Fact Sheet for Patients:   StrictlyIdeas.no  Fact Sheet for Healthcare Providers: BankingDealers.co.za  This test is not yet approved or  cleared by the [place] FDA and has been authorized for detection and/or diagnosis of SARS-CoV-2 by FDA under an Emergency Use Authorization (EUA).  This EUA will remain in effect (meaning this test can be used) for the duration of the COVID-19 declaration under Section 564(b)(1) of the Act, 21 U.S.C. section 360bbb-3(b)(1), unless the authorization is terminated or revoked sooner.  Performed at [facility], [address] [place], [place] [address]   Blood culture (routine x 2)     Status: None   Collection Time: 12/25/19  1:40 AM   Specimen: BLOOD LEFT ARM  Result Value Ref Range Status   Specimen Description   Final    BLOOD LEFT ARM Performed at [facility], [address] [place], [place] [address]    Special Requests   Final    BOTTLES DRAWN AEROBIC AND ANAEROBIC Blood Culture adequate volume Performed at [facility], [address] [place], [place] [address]    Culture   Final    NO GROWTH 5 DAYS Performed at [facility], [place] [address], [place], [place] [address]    Report Status 12/30/2019 FINAL  Final  Blood culture (routine x 2)     Status: None   Collection Time: 12/25/19  1:50 AM   Specimen: BLOOD RIGHT HAND  Result Value Ref Range Status   Specimen Description   Final    BLOOD RIGHT HAND Performed at [facility], [address] [place], [place] [address]    Special Requests   Final    BOTTLES DRAWN AEROBIC AND ANAEROBIC Blood Culture adequate volume Performed at [facility], [address] [place], [place] [address]    Culture   Final    NO GROWTH 5 DAYS Performed at [facility], [address] [place], [place] [address]    Report Status 12/30/2019 FINAL  Final  MRSA PCR Screening     Status: None   Collection Time: 12/25/19  6:40 AM   Specimen: Nasopharyngeal  Result Value Ref Range Status   MRSA by PCR NEGATIVE NEGATIVE Final    Comment:        The GeneXpert MRSA Assay (FDA approved for NASAL specimens only), is one component of a comprehensive MRSA colonization surveillance program. It is not intended to diagnose MRSA infection nor to guide or monitor treatment for MRSA infections. Performed at [facility], [place] [address], [place], [place] [address]   Culture, respiratory     Status: None   Collection Time: 12/26/19  8:18 AM   Specimen: Bronchoalveolar Lavage; Respiratory  Result Value Ref Range Status   Specimen Description BRONCHIAL ALVEOLAR LAVAGE  Final   Special Requests NONE  Final   Gram Stain   Final    RARE WBC PRESENT,BOTH PMN AND MONONUCLEAR NO ORGANISMS SEEN    Culture   Final    FEW MORAXELLA CATARRHALIS(BRANHAMELLA) BETA LACTAMASE POSITIVE Performed at [facility], [place] [address] [place], [place] [address]    Report Status 12/28/2019 FINAL  Final  Fungus Culture With Stain     Status: None (Preliminary result)   Collection Time: 12/26/19  8:18 AM   Specimen: Bronchial Alveolar Lavage  Result Value Ref Range Status   Fungus Stain Final report  Final    Comment: (NOTE) Performed At: [facility] [place], [place] [id number] Rush Farmer MD ZD:[id number]    Fungus (Mycology) Culture PENDING  Incomplete   Fungal Source BRONCHIAL ALVEOLAR LAVAGE  Final    Comment: Performed at [facility], [place] [address], [place], [place] [address]  Fungus Culture Result     Status: None   Collection Time: 12/26/19  8:18 AM  Result Value Ref Range Status   Result 1  Comment  Final    Comment: (NOTE) KOH/Calcofluor preparation:  no fungus observed. Performed At: [facility] [place], [place] [id number] Rush Farmer MD SW:[id number]          Radiology Studies: CT ABDOMEN PELVIS WO CONTRAST  Result Date: 12/30/2019 CLINICAL DATA:  29 year old female with acute abdomen pain. EXAM: CT ABDOMEN AND PELVIS WITHOUT CONTRAST TECHNIQUE: Multidetector CT imaging of the abdomen and pelvis was performed following the standard protocol without IV contrast. COMPARISON:  Abdominal radiograph dated 12/28/2019 chest CT dated 12/25/2019. FINDINGS: Evaluation of this exam is limited in the absence of intravenous contrast. Lower chest: Partially visualized small bilateral pleural effusions with associated partial compressive atelectasis of the lower lobes versus pneumonia, new since the recent chest CT. There is bibasilar linear and streaky densities which may represent edema, or pneumonia. Aspiration is not excluded. There is mild cardiomegaly. Trace pericardial effusion or mild pericardial thickening. Clinical correlation is recommended to evaluate for possibility of pericarditis. Partially visualized probable tip of the dialysis catheter at the cavoatrial junction. There is partial calcification of the mitral annulus. No intra-abdominal free air or free fluid. Hepatobiliary: There is enlargement of the left lobe of the liver which may represent early changes of cirrhosis. No intrahepatic biliary ductal dilatation. High attenuating content within the gallbladder may represent vicarious excretion of recently administered contrast versus sludge or small stones. Pancreas: The pancreas is poorly visualized. There is mild peripancreatic stranding, likely related to systemic congestion or edema. Correlation with pancreatic enzymes  recommended to exclude pancreatitis. Spleen: The spleen is grossly unremarkable. Adrenals/Urinary Tract: Status post prior nephrectomy.  Evaluation of the surgical bed is very limited due to edema. The adrenal glands are poorly visualized. The urinary bladder is collapsed. Stomach/Bowel: There is oral contrast throughout the small bowel and in the colon. No evidence of bowel obstruction. Air is noted in the distal esophagus which may represent reflux. An air-filled tubular structure in the right lower quadrant likely represents a normal appendix. Vascular/Lymphatic: The abdominal aorta is grossly unremarkable. There are scattered linear calcification along the IVC and right common iliac vein concerning for chronic venous thrombus or scarring. Evaluation of the vasculature is very limited on this noncontrast CT. No portal venous gas identified. Nodular densities along the aorta and retroperitoneum may represent vascular collaterals or mildly enlarged lymph nodes. Reproductive: The uterus is anteverted and grossly unremarkable. Metallic coil material noted in the left hemipelvis. The ovaries are not well visualized. Other: There is intramuscular hematoma involving the psoas muscles bilaterally, right greater left. The right hematoma measures approximately 4 x 5 cm in greatest axial dimensions and 7 cm in craniocaudal length. There is layering density within the hematoma which can be seen with bleeding associated with coagulation disorder. There is diffuse subcutaneous stranding of the pelvic wall. Musculoskeletal: No acute osseous pathology. Bilateral femoral head screws. IMPRESSION: 1. Intramuscular hematoma involving the psoas muscles bilaterally, right greater left. There is layering density within the hematoma which can be seen with coagulopathy. 2. No bowel obstruction. Normal appendix. 3. Partially visualized small bilateral pleural effusions with associated partial compressive atelectasis of the lower lobes versus pneumonia, new since the recent chest CT. Aspiration is not excluded. 4. Mild cardiomegaly. 5. Aortic Atherosclerosis (ICD10-I70.0).  6. Diffuse calcification of IVC and additional findings as above. These results were called by telephone at the time of interpretation on 12/30/2019 at 8:28 pm to nurse,Hagerman, who verbally acknowledged these results. Electronically Signed   By: Anner Crete M.D.   On: 12/30/2019 20:28   VAS [place] LOWER EXTREMITY VENOUS (DVT)  Result Date: 01/01/2020  Lower Venous DVTStudy Indications: Pain, and Swelling. Other Indications: SVC stent occlusion. Risk Factors: History of PE and DVT X 2 ESRD, on dialysis since the age of 27, retroperitoneal hemorrhage Anticoagulation: Coumadin. Limitations: Bowel gas, pain in the abdomen secondary to intramuscular hematoma involving the psoas muscles. Comparison Study: No prior study on file Performing Technologist: Sharion Dove RVS  Examination Guidelines: A complete evaluation includes B-mode imaging, spectral Doppler, color Doppler, and power Doppler as needed of all accessible portions of each vessel. Bilateral testing is considered an integral part of a complete examination. Limited examinations for reoccurring indications may be performed as noted. The reflux portion of the exam is performed with the patient in reverse Trendelenburg.  +---------+---------------+---------+-----------+----------+--------------+  RIGHT     Compressibility Phasicity Spontaneity Properties Thrombus Aging  +---------+---------------+---------+-----------+----------+--------------+  CFV       Partial         No        No                     Acute           +---------+---------------+---------+-----------+----------+--------------+  SFJ       Full                                                             +---------+---------------+---------+-----------+----------+--------------+  FV Prox   Partial                                          Acute           +---------+---------------+---------+-----------+----------+--------------+  FV Mid    Partial                                          Acute            +---------+---------------+---------+-----------+----------+--------------+  FV Distal Partial         No        No                     Acute           +---------+---------------+---------+-----------+----------+--------------+  PFV       Full                                                             +---------+---------------+---------+-----------+----------+--------------+  POP       None            No        No                     Acute           +---------+---------------+---------+-----------+----------+--------------+  PTV       None                                             Acute           +---------+---------------+---------+-----------+----------+--------------+  PERO      None                                             Acute           +---------+---------------+---------+-----------+----------+--------------+  Gastroc   None                                             Acute           +---------+---------------+---------+-----------+----------+--------------+  EIV                                                        Not visualized  +---------+---------------+---------+-----------+----------+--------------+  CIV  Not visualized  +---------+---------------+---------+-----------+----------+--------------+ Patient is extremely tender at groin and into abdomen secondary to psoas hematoma. Unable to visualize external or common iliac veins due to pain and bowel gas.  +----+---------------+---------+-----------+----------+--------------+  LEFT Compressibility Phasicity Spontaneity Properties Thrombus Aging  +----+---------------+---------+-----------+----------+--------------+  CFV  Full            Yes       Yes                                    +----+---------------+---------+-----------+----------+--------------+     Summary: RIGHT: - Findings consistent with acute deep vein thrombosis involving the right common femoral vein, right femoral vein,  right popliteal vein, right posterior tibial veins, right peroneal veins, and right gastrocnemius veins.  LEFT: - No evidence of common femoral vein obstruction.  *See table(s) above for measurements and observations. Electronically signed by Monica Martinez MD on 01/01/2020 at 10:52:52 AM.    Final         Scheduled Meds:  amLODipine  2.5 mg Oral Daily   calcium acetate  1,334 mg Oral TID WC   calcium carbonate  1 tablet Oral Daily   Chlorhexidine Gluconate Cloth  6 each Topical Q0600   Chlorhexidine Gluconate Cloth  6 each Topical Q0600   Chlorhexidine Gluconate Cloth  6 each Topical Q0600   Chlorhexidine Gluconate Cloth  6 each Topical Q0600   cholecalciferol  2,000 Units Oral Daily   doxercalciferol  1 mcg Intravenous Q M,W,F-HD   famotidine  20 mg Oral Daily   folic acid  1 mg Oral Daily   multivitamin  1 tablet Oral QHS   predniSONE  20 mg Oral Q breakfast   rosuvastatin  10 mg Oral Daily   senna-docusate  1 tablet Oral BID   sodium chloride flush  3 mL Intravenous Q12H   warfarin  7 mg Oral ONCE-1600   Warfarin - Pharmacist Dosing Inpatient   Does not apply q1600   Continuous Infusions:    LOS: 7 days    Time spent: 35 minutes.     Elmarie Shiley, MD [facility]   If 7PM-7AM, please contact night-coverage [email or web address]  01/01/2020, 12:32 PM

## 2020-01-01 NOTE — Progress Notes (Signed)
This RN was notified by lab tech that patient was refusing morning lab draws. Patient requesting to have labs drawn in HD. Will continue to monitor.

## 2020-01-02 LAB — CBC
HCT: 35.1 % — ABNORMAL LOW (ref 36.0–46.0)
Hemoglobin: 11.2 g/dL — ABNORMAL LOW (ref 12.0–15.0)
MCH: 28.9 pg (ref 26.0–34.0)
MCHC: 31.9 g/dL (ref 30.0–36.0)
MCV: 90.5 fL (ref 80.0–100.0)
Platelets: 146 10*3/uL — ABNORMAL LOW (ref 150–400)
RBC: 3.88 MIL/uL (ref 3.87–5.11)
RDW: 15.2 % (ref 11.5–15.5)
WBC: 3.9 10*3/uL — ABNORMAL LOW (ref 4.0–10.5)
nRBC: 0 % (ref 0.0–0.2)

## 2020-01-02 LAB — PROTIME-INR
INR: 2.5 — ABNORMAL HIGH (ref 0.8–1.2)
Prothrombin Time: 25.8 seconds — ABNORMAL HIGH (ref 11.4–15.2)

## 2020-01-02 MED ORDER — WARFARIN SODIUM 1 MG PO TABS: 7.0000 mg | ORAL_TABLET | Freq: Every day | ORAL | 3 refills | Status: AC

## 2020-01-02 MED ORDER — SENNOSIDES-DOCUSATE SODIUM 8.6-50 MG PO TABS: 1.0000 | ORAL_TABLET | Freq: Two times a day (BID) | ORAL | 0 refills | Status: AC

## 2020-01-02 MED ORDER — VITAMIN D3 25 MCG PO TABS: 2000.0000 [IU] | ORAL_TABLET | Freq: Every day | ORAL | 0 refills | Status: AC

## 2020-01-02 MED ORDER — WARFARIN SODIUM 5 MG PO TABS: 7.0000 mg | ORAL_TABLET | Freq: Once | ORAL | Status: DC

## 2020-01-02 MED ORDER — AMLODIPINE BESYLATE 2.5 MG PO TABS: 2.5000 mg | ORAL_TABLET | Freq: Every day | ORAL | 0 refills | Status: AC

## 2020-01-02 MED ORDER — TRAMADOL HCL 50 MG PO TABS: 50.0000 mg | ORAL_TABLET | Freq: Four times a day (QID) | ORAL | 0 refills | Status: AC | PRN

## 2020-01-02 NOTE — Progress Notes (Signed)
[place] for heparin Indication: history of pulmonary embolus and DVT  Allergies  Allergen Reactions  . Cefazolin Anaphylaxis  . Ferrlecit [Na Ferric Gluc Cplx In Sucrose] Anaphylaxis    Anaphylactic shock/severe angioedema on 08/09/15 related to IV iron Ferrlecit IV)  required intubation for respiratory failure  . Cyclosporine Other (See Comments)    Loss of motor skills  . Lactose Intolerance (Gi) Nausea Only    Stomach pains also  . Latex Rash  . Vancomycin Rash    Patient Measurements: Height: 5\' 2"  (157.5 cm) Weight: 49.8 kg (109 lb 12.6 oz) IBW/kg (Calculated) : 50.1 Heparin Dosing Weight: 48.6 kg   Vital Signs: Temp: 98.2 F (36.8 C) (07/06 0359) Temp Source: Oral (07/06 0359) BP: 106/78 (07/06 0359) Pulse Rate: 64 (07/06 0359)  Labs: Recent Labs    12/30/19 1130 12/30/19 1130 12/31/19 0249 12/31/19 0507 12/31/19 0507 12/31/19 1925 01/01/20 1546 01/02/20 0236 01/02/20 0239  HGB  --   --   --  10.7*   < > 11.2*  --  11.2*  --   HCT  --   --   --  34.1*  --  34.5*  --  35.1*  --   PLT  --   --   --  172  --   --   --  146*  --   LABPROT 21.9*   < > 22.5*  --   --   --  26.6*  --  25.8*  INR 2.0*   < > 2.1*  --   --   --  2.6*  --  2.5*  HEPARINUNFRC 0.32  --   --   --   --   --   --   --   --   CREATININE  --   --   --   --   --   --  11.16*  --   --    < > = values in this interval not displayed.    Estimated Creatinine Clearance: 5.8 mL/min (A) (by C-G formula based on SCr of 11.16 mg/dL (H)).  Assessment: 28 y.o. female with h/o PE/DVT for anticoagulation. Found to have IM hematoma involving the psoas muscle (R>L) 7/3 and duplex finding acute R DVT 7/4.  Evaluated by MD and plan for possible IVC filter, but pt has chronic IVC occlusion. Warfarin continues for now.  INR therapeutic at 2.5, CBC stable.  **Home warfarin dose 7mg  daily  Goal of Therapy:  INR 2-3 - aiming for lower end of the goal  Heparin level  0.3-0.5 units/ml Monitor platelets by anticoagulation protocol: Yes   Plan:  -Warfarin 7mg  PO x1 tonight -Daily INR   Arrie Senate, PharmD, BCPS Clinical Pharmacist [phone] Please check AMION for all [place] numbers 01/02/2020

## 2020-01-02 NOTE — Discharge Instructions (Signed)
Please follow up with your Primary hematologist at [place].   Please you need INR check in 2 days to monitor coumadin level.

## 2020-01-02 NOTE — Care Management Important Message (Signed)
Important Message  Patient Details  Name: Crystal Dunlap MRN: [id number] Date of Birth: [birth year]   Medicare Important Message Given:  Yes     Kess Mcilwain 01/02/2020, 2:22 PM

## 2020-01-02 NOTE — Discharge Summary (Signed)
Physician Discharge Summary  Jeanny Rymer ZJQ:[id number] DOB: 01/21/1991 DOA: 12/24/2019  PCP: Henderson Baltimore, FNP  Admit date: 12/24/2019 Discharge date: 01/02/2020  Admitted From: Home Disposition:  Home  Recommendations for Outpatient Follow-up:  1. Follow up with PCP in 1-2 weeks 2. Please obtain BMP/CBC in one week 3. Needs close follow up with hematologist at [facility].  4. Needs INR check in 2 days.  5. Continue to monitor hb.     Discharge Condition: Stable.  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  She reports coughing up a lot of blood started the day prior to admission, prior red blood.  She had a cold and had been coughing and thinks this is what led to it.  She report low-grade fever.  No GI symptoms, no sick contacts.  She was feeling well other than mild cough onto the day prior to admission.  She is on Coumadin and her INR fluctuates. Patient had a CT angiogram which was negative for PE with show obstruction of her SVC stent.  Dr. Doren Custard on-call vascular surgery advised no intervention at this time. Consulted, patient underwent bronchoscopy on 6/29.  Pulmonology think hemoptysis likely related to  Bronchitis. Patient was restarted on coumadin bridge with heparin. She then develops flank and right leg pain. CT showed psoas hematoma and doppler was positive this admission for DVT Right LE.   1-Hemoptysis: Resolved Evaluated by CCM, underwent bronchoscopy cultures negative so far and  cytology negative. Pulmonologist think hemoptysis is related to acute bronchitis. Patient was started on Prednisone.  -Patient has significant pulmonary esophageal collateral with esophageal varices, with patient appears certain that this is  about his rather than hematemesis Hb stable.   Intramuscular Hematoma,Psoas Muscle: Hb remain stable at 10.--11 Repeat hb tonight and tomorrow am.  IV dilaudid PRN for pain. Oral vicodin.  Suspect was related to supra-therapeutic heparin gtt.  Hb remain stable at 11. Pain improved.  Stable for discharge.   Right LE DVT, acute femoral, tibial peroneal:  Hematology consulted.  Discussed with Dr Benay Spice, plan to proceed with coumadin resumption and IVC filter placement.  Case reviewed by Dr Annamaria Boots Patient has chronic IVC occlusion, seen on prior imagine. no need for IVC filter  INR at 2.5. INR goal 2.5--3  B/L lower atelectasis;  No fever.  Incentive spirometry.   Hypocalcemia; replaced. Resolved.  Started on oral supplement.   ESRD on hemodialysis MWF: Received Lokelma for hyperkalemia.   Hyperkalemia: Resolved  SVC stent occlusion: Per EMR discussion with Dr. Doren Custard by EDP.  Appears to be subacute versus chronic in nature.  No surgical intervention indicated at this time. Discussed with Renal, plan for formal Vascular consultation. No further intervention for SVC stent occlusion.   Hypotension: Resolved  Hyperlipidemia: Continue with rosuvastatin  History of PE times 2 and DVT: Patient need to be on anticoagulation indefinitely Continue with coumadin.   Leukopenia:  B12 normal.  Left side Abdominal pain; probably related to constipation. Sorbitol ordered.  Pain resolved after BM.   Right hip pain; psoas Hematoma.    Discharge Diagnoses:  Principal Problem:   Hemoptysis Active Problems:   End stage renal disease on dialysis [facility])   Essential hypertension   History of pulmonary embolism   Occlusion of stent of peripheral artery  Dyslipidemia    Discharge Instructions  Discharge Instructions    Diet - low sodium heart healthy   Complete by: As directed    Increase activity slowly   Complete by: As directed    No wound care   Complete by: As  directed      Allergies as of 01/02/2020      Reactions   Cefazolin Anaphylaxis   Ferrlecit [na Ferric Gluc Cplx In Sucrose] Anaphylaxis   Anaphylactic shock/severe angioedema on 08/09/15 related to IV iron Ferrlecit IV)  required intubation for respiratory failure   Cyclosporine Other (See Comments)   Loss of motor skills   Lactose Intolerance (gi) Nausea Only   Stomach pains also   Latex Rash   Vancomycin Rash      Medication List    STOP taking these medications   diphenhydrAMINE 25 mg capsule Commonly known as: Benadryl   Vitamin D3 50 MCG (2000 UT) capsule Replaced by: Vitamin D3 25 MCG tablet     TAKE these medications   amLODipine 2.5 MG tablet Commonly known as: NORVASC Take 1 tablet (2.5 mg total) by mouth daily.   benzonatate 100 MG capsule Commonly known as: TESSALON Take 1 capsule (100 mg total) by mouth every 8 (eight) hours.   Calcium Acetate 668 (169 Ca) MG Tabs Take 1,336 mg by mouth 3 (three) times daily with meals.   Crestor 10 MG tablet Generic drug: rosuvastatin Take 10 mg by mouth every evening.   docusate sodium 100 MG capsule Commonly known as: COLACE Take 200 mg by mouth daily as needed for mild constipation.   EPINEPHrine 0.3 mg/0.3 mL Soaj injection Commonly known as: EPI-PEN Inject 0.3 mLs (0.3 mg total) into the muscle once. What changed:   when to take this  reasons to take this   famotidine 20 MG tablet Commonly known as: PEPCID Take 20 mg by mouth at bedtime.   ferrous sulfate 325 (65 FE) MG EC tablet Take 325 mg by mouth at bedtime.   senna-docusate 8.6-50 MG tablet Commonly known as: Senokot-S Take 1 tablet by mouth 2 (two) times daily.   sevelamer carbonate 800 MG tablet Commonly known as: RENVELA Take 2,400 mg by mouth 3 (three) times daily with meals.   traMADol 50 MG tablet Commonly known as: Ultram Take 1 tablet (50 mg total) by mouth every 6 (six) hours as needed for up to 3 days.   Vitamin D3 25 MCG  tablet Commonly known as: Vitamin D Take 2 tablets (2,000 Units total) by mouth daily. Replaces: Vitamin D3 50 MCG (2000 UT) capsule   warfarin 1 MG tablet Commonly known as: COUMADIN Take 7 tablets (7 mg total) by mouth daily. What changed:   medication strength  how much to take  when to take this  additional instructions  Another medication with the same name was removed. Continue taking this medication, and follow the directions you see here.       Follow-up Information    Cousins, Gracy Bruins, FNP Follow up.   Specialty: Nurse Practitioner Why: You Need INR check your coumadin level in 2 days.  Contact information: [place] [address] [phone]              Allergies  Allergen Reactions  . Cefazolin Anaphylaxis  . Ferrlecit [Na Ferric Gluc Cplx In Sucrose] Anaphylaxis    Anaphylactic shock/severe angioedema on 08/09/15 related to IV iron Ferrlecit IV)  required intubation  for respiratory failure  . Cyclosporine Other (See Comments)    Loss of motor skills  . Lactose Intolerance (Gi) Nausea Only    Stomach pains also  . Latex Rash  . Vancomycin Rash    Consultations:  Hematology  Nephrology  Vascular surgeon.    Procedures/Studies: CT ABDOMEN PELVIS WO CONTRAST  Result Date: 12/30/2019 CLINICAL DATA:  29 year old female with acute abdomen pain. EXAM: CT ABDOMEN AND PELVIS WITHOUT CONTRAST TECHNIQUE: Multidetector CT imaging of the abdomen and pelvis was performed following the standard protocol without IV contrast. COMPARISON:  Abdominal radiograph dated 12/28/2019 chest CT dated 12/25/2019. FINDINGS: Evaluation of this exam is limited in the absence of intravenous contrast. Lower chest: Partially visualized small bilateral pleural effusions with associated partial compressive atelectasis of the lower lobes versus pneumonia, new since the recent chest CT. There is bibasilar linear and streaky densities which  may represent edema, or pneumonia. Aspiration is not excluded. There is mild cardiomegaly. Trace pericardial effusion or mild pericardial thickening. Clinical correlation is recommended to evaluate for possibility of pericarditis. Partially visualized probable tip of the dialysis catheter at the cavoatrial junction. There is partial calcification of the mitral annulus. No intra-abdominal free air or free fluid. Hepatobiliary: There is enlargement of the left lobe of the liver which may represent early changes of cirrhosis. No intrahepatic biliary ductal dilatation. High attenuating content within the gallbladder may represent vicarious excretion of recently administered contrast versus sludge or small stones. Pancreas: The pancreas is poorly visualized. There is mild peripancreatic stranding, likely related to systemic congestion or edema. Correlation with pancreatic enzymes recommended to exclude pancreatitis. Spleen: The spleen is grossly unremarkable. Adrenals/Urinary Tract: Status post prior nephrectomy. Evaluation of the surgical bed is very limited due to edema. The adrenal glands are poorly visualized. The urinary bladder is collapsed. Stomach/Bowel: There is oral contrast throughout the small bowel and in the colon. No evidence of bowel obstruction. Air is noted in the distal esophagus which may represent reflux. An air-filled tubular structure in the right lower quadrant likely represents a normal appendix. Vascular/Lymphatic: The abdominal aorta is grossly unremarkable. There are scattered linear calcification along the IVC and right common iliac vein concerning for chronic venous thrombus or scarring. Evaluation of the vasculature is very limited on this noncontrast CT. No portal venous gas identified. Nodular densities along the aorta and retroperitoneum may represent vascular collaterals or mildly enlarged lymph nodes. Reproductive: The uterus is anteverted and grossly unremarkable. Metallic coil  material noted in the left hemipelvis. The ovaries are not well visualized. Other: There is intramuscular hematoma involving the psoas muscles bilaterally, right greater left. The right hematoma measures approximately 4 x 5 cm in greatest axial dimensions and 7 cm in craniocaudal length. There is layering density within the hematoma which can be seen with bleeding associated with coagulation disorder. There is diffuse subcutaneous stranding of the pelvic wall. Musculoskeletal: No acute osseous pathology. Bilateral femoral head screws. IMPRESSION: 1. Intramuscular hematoma involving the psoas muscles bilaterally, right greater left. There is layering density within the hematoma which can be seen with coagulopathy. 2. No bowel obstruction. Normal appendix. 3. Partially visualized small bilateral pleural effusions with associated partial compressive atelectasis of the lower lobes versus pneumonia, new since the recent chest CT. Aspiration is not excluded. 4. Mild cardiomegaly. 5. Aortic Atherosclerosis (ICD10-I70.0). 6. Diffuse calcification of IVC and additional findings as above. These results were called by telephone at the time of interpretation on 12/30/2019 at 8:28 pm to nurse,Hagerman, who verbally acknowledged  these results. Electronically Signed   By: Anner Crete M.D.   On: 12/30/2019 20:28   DG Abd 1 View  Result Date: 12/28/2019 CLINICAL DATA:  Left lower quadrant pain, history of SBO EXAM: ABDOMEN - 1 VIEW COMPARISON:  None. FINDINGS: Large volume high attenuation material within the colon, could reflect prior ingestion of bismuth containing products or inspissated stool within overall large colonic stool burden. No high-grade obstructive bowel gas pattern is seen however. Surgical clips noted in the left upper quadrant and left hemipelvis. Prior transcervical fixation of both femora. Mild levocurvature of the lumbar spine. IMPRESSION: 1. Large volume high attenuation material within the colon, could  reflect prior ingestion of bismuth containing products or inspissated stool within an overall large colonic stool burden. Correlate for features of constipation. 2. No high-grade obstructive bowel gas pattern. Electronically Signed   By: Lovena Le M.D.   On: 12/28/2019 20:10   CT Angio Chest PE W and/or Wo Contrast  Addendum Date: 12/25/2019   ADDENDUM REPORT: 12/25/2019 01:45 ADDENDUM: The stent within the superior vena cava was in fact present on the previous CT, but was more difficult to appreciate due to contrast within the patent lumen. The occlusion stent near the atrial caval junction has occurred in the interim since 2019. Electronically Signed   By: Randa Ngo M.D.   On: 12/25/2019 01:45   Result Date: 12/25/2019 CLINICAL DATA:  Productive cough for 1 week, hemoptysis EXAM: CT ANGIOGRAPHY CHEST WITH CONTRAST TECHNIQUE: Multidetector CT imaging of the chest was performed using the standard protocol during bolus administration of intravenous contrast. Multiplanar CT image reconstructions and MIPs were obtained to evaluate the vascular anatomy. CONTRAST:  68m OMNIPAQUE IOHEXOL 350 MG/ML SOLN COMPARISON:  12/24/2017, 12/24/2019 FINDINGS: Cardiovascular: Contrast was injected via the left upper extremity. Since the previous exam, a stent has been placed within the superior vena cava just proximal to the atrial caval junction. There is evidence of complete occlusion of the stent, with extensive venous collateral opacification as result of the pressure injection. Contrast refluxes into numerous collateral veins in the mediastinum and retroperitoneum. This results in a nondiagnostic evaluation of the pulmonary vasculature due to insufficient contrast enhancement. Heart is unremarkable without pericardial effusion. Thoracic aorta is normal in caliber with no aneurysm or dissection. Mediastinum/Nodes: No enlarged mediastinal, hilar, or axillary lymph nodes. Thyroid gland, trachea, and esophagus  demonstrate no significant findings. Lungs/Pleura: There is patchy ground-glass airspace disease within the right lung base within the right middle and right lower lobes. This could reflect aspiration or hemorrhage given clinical presentation. There is a 6 mm ground-glass right upper lobe nodule abutting the minor fissure, reference image 38. No other airspace disease, effusion, or pneumothorax. The central airways are patent. Upper Abdomen: Extensive venous collaterals are seen within the upper abdomen. This includes large varices surrounding the distal esophagus, which may account for the patient's hematemesis. Musculoskeletal: There are no acute or destructive bony lesions. Chronic renal osteodystrophy again noted. Reconstructed images demonstrates no additional findings. Review of the MIP images confirms the above findings. IMPRESSION: 1. Occlusion of the SVC stent. As result, the contrast power injected into the left upper extremity refluxes into numerous mediastinal and retroperitoneal venous collaterals. Opacification of the pulmonary vasculature is nondiagnostic for the detection of pulmonary emboli. 2. Right middle and right lower lobe ground-glass airspace disease which may reflect hemorrhage or aspiration. 3. 6 mm right upper lobe ground-glass nodule. Initial follow-up with CT at 6-12 months is recommended to confirm persistence.  If persistent, repeat CT is recommended every 2 years until 5 years of stability has been established. This recommendation follows the consensus statement: Guidelines for Management of Incidental Pulmonary Nodules Detected on CT Images: From the Fleischner Society 2017; Radiology 2017; 284:228-243. 4. Numerous varices surround the distal esophagus, and may account for the patient's hematemesis. Electronically Signed: By: Randa Ngo M.D. On: 12/25/2019 00:31   DG Chest Portable 1 View  Result Date: 12/24/2019 CLINICAL DATA:  Cough. EXAM: PORTABLE CHEST 1 VIEW COMPARISON:   July 04, 2018 FINDINGS: There is a well-positioned left-sided tunneled dialysis catheter. A left brachiocephalic vein stent is noted. The heart size is stable. There is a small right-sided pleural effusion which is stable from prior study. There is no pneumothorax. No focal infiltrate. IMPRESSION: 1. Stable small right-sided pleural effusion. 2. Well-positioned left-sided tunneled dialysis catheter. Electronically Signed   By: Constance Holster M.D.   On: 12/24/2019 21:53   DG HIP UNILAT WITH PELVIS 1V RIGHT  Result Date: 12/30/2019 CLINICAL DATA:  Right hip pain for 2 days.  No known injury. EXAM: DG HIP (WITH OR WITHOUT PELVIS) 1V RIGHT COMPARISON:  None. FINDINGS: No acute bony or joint abnormality is identified. Single screws are in place across both femoral necks. The screws are intact without complicating feature. No avascular necrosis of the femoral heads. Mild bilateral acetabular dysplasia is more notable on the right. Joint spaces are preserved. Vascular coils and surgical clips in the left pelvis are seen. IMPRESSION: No acute abnormality.  Negative for arthropathy. Mild bilateral acetabular dysplasia. Screws in place across the femoral necks bilaterally may be due for fixation of old slipped capital femoral epiphysis. Electronically Signed   By: Inge Rise M.D.   On: 12/30/2019 14:16   VAS [place] LOWER EXTREMITY VENOUS (DVT)  Result Date: 01/01/2020  Lower Venous DVTStudy Indications: Pain, and Swelling. Other Indications: SVC stent occlusion. Risk Factors: History of PE and DVT X 2 ESRD, on dialysis since the age of 44, retroperitoneal hemorrhage Anticoagulation: Coumadin. Limitations: Bowel gas, pain in the abdomen secondary to intramuscular hematoma involving the psoas muscles. Comparison Study: No prior study on file Performing Technologist: Sharion Dove RVS  Examination Guidelines: A complete evaluation includes B-mode imaging, spectral Doppler, color Doppler, and power Doppler as needed  of all accessible portions of each vessel. Bilateral testing is considered an integral part of a complete examination. Limited examinations for reoccurring indications may be performed as noted. The reflux portion of the exam is performed with the patient in reverse Trendelenburg.  +---------+---------------+---------+-----------+----------+--------------+ RIGHT    CompressibilityPhasicitySpontaneityPropertiesThrombus Aging +---------+---------------+---------+-----------+----------+--------------+ CFV      Partial        No       No                   Acute          +---------+---------------+---------+-----------+----------+--------------+ SFJ      Full                                                        +---------+---------------+---------+-----------+----------+--------------+ FV Prox  Partial                                      Acute          +---------+---------------+---------+-----------+----------+--------------+  FV Mid   Partial                                      Acute          +---------+---------------+---------+-----------+----------+--------------+ FV DistalPartial        No       No                   Acute          +---------+---------------+---------+-----------+----------+--------------+ PFV      Full                                                        +---------+---------------+---------+-----------+----------+--------------+ POP      None           No       No                   Acute          +---------+---------------+---------+-----------+----------+--------------+ PTV      None                                         Acute          +---------+---------------+---------+-----------+----------+--------------+ PERO     None                                         Acute          +---------+---------------+---------+-----------+----------+--------------+ Gastroc  None                                         Acute           +---------+---------------+---------+-----------+----------+--------------+ EIV                                                   Not visualized +---------+---------------+---------+-----------+----------+--------------+ CIV                                                   Not visualized +---------+---------------+---------+-----------+----------+--------------+ Patient is extremely tender at groin and into abdomen secondary to psoas hematoma. Unable to visualize external or common iliac veins due to pain and bowel gas.  +----+---------------+---------+-----------+----------+--------------+ LEFTCompressibilityPhasicitySpontaneityPropertiesThrombus Aging +----+---------------+---------+-----------+----------+--------------+ CFV Full           Yes      Yes                                 +----+---------------+---------+-----------+----------+--------------+     Summary: RIGHT: - Findings consistent with acute deep vein thrombosis involving the right common femoral vein, right femoral  vein, right popliteal vein, right posterior tibial veins, right peroneal veins, and right gastrocnemius veins.  LEFT: - No evidence of common femoral vein obstruction.  *See table(s) above for measurements and observations. Electronically signed by Monica Martinez MD on 01/01/2020 at 10:52:52 AM.    Final      Subjective: Feeling well, pain improved.   Discharge Exam: Vitals:   01/02/20 0740 01/02/20 0755  BP: 101/75   Pulse: 67 76  Resp: 12 14  Temp: 98.5 F (36.9 C)   SpO2: 92% 94%     General: Pt is alert, awake, not in acute distress Cardiovascular: RRR, S1/S2 +, no rubs, no gallops Respiratory: CTA bilaterally, no wheezing, no rhonchi Abdominal: Soft, NT, ND, bowel sounds + Extremities: no edema, no cyanosis    The results of significant diagnostics from this hospitalization (including imaging, microbiology, ancillary and laboratory) are listed below for reference.      Microbiology: Recent Results (from the past 240 hour(s))  SARS Coronavirus 2 by RT PCR (hospital order, performed in [facility] hospital lab) Nasopharyngeal Nasopharyngeal Swab     Status: None   Collection Time: 12/24/19  9:12 PM   Specimen: Nasopharyngeal Swab  Result Value Ref Range Status   SARS Coronavirus 2 NEGATIVE NEGATIVE Final    Comment: (NOTE) SARS-CoV-2 target nucleic acids are NOT DETECTED.  The SARS-CoV-2 RNA is generally detectable in upper and lower respiratory specimens during the acute phase of infection. The lowest concentration of SARS-CoV-2 viral copies this assay can detect is 250 copies / mL. A negative result does not preclude SARS-CoV-2 infection and should not be used as the sole basis for treatment or other patient management decisions.  A negative result may occur with improper specimen collection / handling, submission of specimen other than nasopharyngeal swab, presence of viral mutation(s) within the areas targeted by this assay, and inadequate number of viral copies (<250 copies / mL). A negative result must be combined with clinical observations, patient history, and epidemiological information.  Fact Sheet for Patients:   StrictlyIdeas.no  Fact Sheet for Healthcare Providers: BankingDealers.co.za  This test is not yet approved or  cleared by the [place] FDA and has been authorized for detection and/or diagnosis of SARS-CoV-2 by FDA under an Emergency Use Authorization (EUA).  This EUA will remain in effect (meaning this test can be used) for the duration of the COVID-19 declaration under Section 564(b)(1) of the Act, 21 U.S.C. section 360bbb-3(b)(1), unless the authorization is terminated or revoked sooner.  Performed at [facility], [address] [place], [place] [address]   Blood culture (routine x 2)     Status: None   Collection Time: 12/25/19  1:40 AM   Specimen:  BLOOD LEFT ARM  Result Value Ref Range Status   Specimen Description   Final    BLOOD LEFT ARM Performed at [facility], [address] [place], [place] [address]    Special Requests   Final    BOTTLES DRAWN AEROBIC AND ANAEROBIC Blood Culture adequate volume Performed at [facility], [address] [place], [place] [address]    Culture   Final    NO GROWTH 5 DAYS Performed at [facility], [place] [address] [place], [place] [address]    Report Status 12/30/2019 FINAL  Final  Blood culture (routine x 2)     Status: None   Collection Time: 12/25/19  1:50 AM   Specimen: BLOOD RIGHT  HAND  Result Value Ref Range Status   Specimen Description   Final    BLOOD RIGHT HAND Performed at [facility], [address] [place], [place] [address]    Special Requests   Final    BOTTLES DRAWN AEROBIC AND ANAEROBIC Blood Culture adequate volume Performed at [facility], [address] [place], [place] [address]    Culture   Final    NO GROWTH 5 DAYS Performed at [facility], [place] [address], [place], [place] [address]    Report Status 12/30/2019 FINAL  Final  MRSA PCR Screening     Status: None   Collection Time: 12/25/19  6:40 AM   Specimen: Nasopharyngeal  Result Value Ref Range Status   MRSA by PCR NEGATIVE NEGATIVE Final    Comment:        The GeneXpert MRSA Assay (FDA approved for NASAL specimens only), is one component of a comprehensive MRSA colonization surveillance program. It is not intended to diagnose MRSA infection nor to guide or monitor treatment for MRSA infections. Performed at [facility], [place] [address] [place], [place] [address]   Culture, respiratory     Status: None   Collection Time: 12/26/19  8:18 AM   Specimen: Bronchoalveolar Lavage; Respiratory  Result Value Ref Range Status   Specimen Description BRONCHIAL ALVEOLAR LAVAGE  Final   Special Requests NONE  Final   Gram Stain   Final     RARE WBC PRESENT,BOTH PMN AND MONONUCLEAR NO ORGANISMS SEEN    Culture   Final    FEW MORAXELLA CATARRHALIS(BRANHAMELLA) BETA LACTAMASE POSITIVE Performed at [facility], [place] [address] [place], [place] [address]    Report Status 12/28/2019 FINAL  Final  Fungus Culture With Stain     Status: None (Preliminary result)   Collection Time: 12/26/19  8:18 AM   Specimen: Bronchial Alveolar Lavage  Result Value Ref Range Status   Fungus Stain Final report  Final    Comment: (NOTE) Performed At: [facility] [place], [place] [id number] Rush Farmer MD YI:[id number]    Fungus (Mycology) Culture PENDING  Incomplete   Fungal Source BRONCHIAL ALVEOLAR LAVAGE  Final    Comment: Performed at [facility], [place] [address], [place],  [address]  Fungus Culture Result     Status: None   Collection Time: 12/26/19  8:18 AM  Result Value Ref Range Status   Result 1 Comment  Final    Comment: (NOTE) KOH/Calcofluor preparation:  no fungus observed. Performed At: [facility] [place], [place] [id number] Rush Farmer MD IR:[id number]      Labs: BNP (last 3 results) No results for input(s): BNP in the last 8760 hours. Basic Metabolic Panel: Recent Labs  Lab 12/27/19 1200 12/28/19 0307 12/29/19 2320 01/01/20 1546  NA 132* 136 133* 126*  K 4.5 4.5 5.3* 5.0  CL 97* 101 98 92*  CO2 _0 19*  GLUCOSE 199* 93 99 130*  BUN 41* 24* 38* 41*  CREATININE 10.76* 6.98* 10.61* 11.16*  CALCIUM 6.6* 6.5* 8.2* 8.0*  MG 2.3 2.1 2.5*  --   PHOS 4.5 3.6 4.1 3.7   Liver Function Tests: Recent Labs  Lab 12/27/19 1200 12/28/19 0307 12/29/19 2320 01/01/20 1546  AST _1 --   ALT _2 --   ALKPHOS 86 78 83  --   BILITOT 0.3 0.6  0.6  --   PROT 7.2 6.8 6.9  --   ALBUMIN 3.6 3.6 3.7 3.5   No results for input(s): LIPASE, AMYLASE in the last 168 hours. No results for input(s): AMMONIA in the last 168  hours. CBC: Recent Labs  Lab 12/27/19 1200 12/27/19 1200 12/28/19 0307 12/29/19 2320 12/31/19 0507 12/31/19 1925 01/02/20 0236  WBC 3.8*  --  2.8* 2.8* 3.2*  --  3.9*  NEUTROABS  --   --   --   --  2.3  --   --   HGB 11.2*   < > 11.1* 10.9* 10.7* 11.2* 11.2*  HCT 37.3   < > 36.4 36.4 34.1* 34.5* 35.1*  MCV 97.1  --  95.5 96.3 94.5  --  90.5  PLT 156  --  159 148* 172  --  146*   < > = values in this interval not displayed.   Cardiac Enzymes: No results for input(s): CKTOTAL, CKMB, CKMBINDEX, TROPONINI in the last 168 hours. BNP: Invalid input(s): POCBNP CBG: No results for input(s): GLUCAP in the last 168 hours. D-Dimer No results for input(s): DDIMER in the last 72 hours. Hgb A1c No results for input(s): HGBA1C in the last 72 hours. Lipid Profile No results for input(s): CHOL, HDL, LDLCALC, TRIG, CHOLHDL, LDLDIRECT in the last 72 hours. Thyroid function studies No results for input(s): TSH, T4TOTAL, T3FREE, THYROIDAB in the last 72 hours.  Invalid input(s): FREET3 Anemia work up No results for input(s): VITAMINB12, FOLATE, FERRITIN, TIBC, IRON, RETICCTPCT in the last 72 hours. Urinalysis No results found for: COLORURINE, APPEARANCEUR, [place], [place], [place], [place], [place], [place], PROTEINUR, UROBILINOGEN, NITRITE, LEUKOCYTESUR Sepsis Labs Invalid input(s): PROCALCITONIN,  WBC,  LACTICIDVEN Microbiology Recent Results (from the past 240 hour(s))  SARS Coronavirus 2 by RT PCR (hospital order, performed in [facility] hospital lab) Nasopharyngeal Nasopharyngeal Swab     Status: None   Collection Time: 12/24/19  9:12 PM   Specimen: Nasopharyngeal Swab  Result Value Ref Range Status   SARS Coronavirus 2 NEGATIVE NEGATIVE Final    Comment: (NOTE) SARS-CoV-2 target nucleic acids are NOT DETECTED.  The SARS-CoV-2 RNA is generally detectable in upper and lower respiratory specimens during the acute phase of infection. The lowest concentration of SARS-CoV-2  viral copies this assay can detect is 250 copies / mL. A negative result does not preclude SARS-CoV-2 infection and should not be used as the sole basis for treatment or other patient management decisions.  A negative result may occur with improper specimen collection / handling, submission of specimen other than nasopharyngeal swab, presence of viral mutation(s) within the areas targeted by this assay, and inadequate number of viral copies (<250 copies / mL). A negative result must be combined with clinical observations, patient history, and epidemiological information.  Fact Sheet for Patients:   StrictlyIdeas.no  Fact Sheet for Healthcare Providers: BankingDealers.co.za  This test is not yet approved or  cleared by the [place] FDA and has been authorized for detection and/or diagnosis of SARS-CoV-2 by FDA under an Emergency Use Authorization (EUA).  This EUA will remain in effect (meaning this test can be used) for the duration of the COVID-19 declaration under Section 564(b)(1) of the Act, 21 U.S.C. section 360bbb-3(b)(1), unless the authorization is terminated or revoked sooner.  Performed at [facility], [place] [place], [place] [address]   Blood culture (routine x 2)     Status: None   Collection Time: 12/25/19  1:40 AM  Specimen: BLOOD LEFT ARM  Result Value Ref Range Status   Specimen Description   Final    BLOOD LEFT ARM Performed at [facility], [place] [place], [place] [address]    Special Requests   Final    BOTTLES DRAWN AEROBIC AND ANAEROBIC Blood Culture adequate volume Performed at [facility], [place] [place], [place] [address]    Culture   Final    NO GROWTH 5 DAYS Performed at [facility], [place] [address], [place], [place] [address]    Report Status 12/30/2019 FINAL  Final  Blood culture (routine x 2)     Status: None   Collection  Time: 12/25/19  1:50 AM   Specimen: BLOOD RIGHT HAND  Result Value Ref Range Status   Specimen Description   Final    BLOOD RIGHT HAND Performed at [facility], [address] [place], [place] [address]    Special Requests   Final    BOTTLES DRAWN AEROBIC AND ANAEROBIC Blood Culture adequate volume Performed at [facility], [place] [place], [place] [address]    Culture   Final    NO GROWTH 5 DAYS Performed at [facility], [place] [address] [place], [place] [address]    Report Status 12/30/2019 FINAL  Final  MRSA PCR Screening     Status: None   Collection Time: 12/25/19  6:40 AM   Specimen: Nasopharyngeal  Result Value Ref Range Status   MRSA by PCR NEGATIVE NEGATIVE Final    Comment:        The GeneXpert MRSA Assay (FDA approved for NASAL specimens only), is one component of a comprehensive MRSA colonization surveillance program. It is not intended to diagnose MRSA infection nor to guide or monitor treatment for MRSA infections. Performed at [facility], [place] [address] [place], [place] [address]   Culture, respiratory     Status: None   Collection Time: 12/26/19  8:18 AM   Specimen: Bronchoalveolar Lavage; Respiratory  Result Value Ref Range Status   Specimen Description BRONCHIAL ALVEOLAR LAVAGE  Final   Special Requests NONE  Final   Gram Stain   Final    RARE WBC PRESENT,BOTH PMN AND MONONUCLEAR NO ORGANISMS SEEN    Culture   Final    FEW MORAXELLA CATARRHALIS(BRANHAMELLA) BETA LACTAMASE POSITIVE Performed at [facility], [place] [address], [place], [place] [address]    Report Status 12/28/2019 FINAL  Final  Fungus Culture With Stain     Status: None (Preliminary result)   Collection Time: 12/26/19  8:18 AM   Specimen: Bronchial Alveolar Lavage  Result Value Ref Range Status   Fungus Stain Final report  Final    Comment: (NOTE) Performed At: [facility] [place], [place]  [id number] Rush Farmer MD LY:[id number]    Fungus (Mycology) Culture PENDING  Incomplete   Fungal Source BRONCHIAL ALVEOLAR LAVAGE  Final    Comment: Performed at [facility], [place] [address], [place], [place] [address]  Fungus Culture Result     Status: None   Collection Time: 12/26/19  8:18 AM  Result Value Ref Range Status   Result 1 Comment  Final    Comment: (NOTE) KOH/Calcofluor preparation:  no fungus observed. Performed At: [place], [place] [id number] Rush Farmer MD HQ:[id number]      Time coordinating discharge: 36  minutes  SIGNED:   Elmarie Shiley, MD  [facility]

## 2020-01-02 NOTE — Progress Notes (Signed)
[facility] NEPHROLOGY PROGRESS NOTE  Assessment/ Plan: Pt is a 29 y.o. 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  OP HD: [facility] MWF   3.5h  350/800  47kg  2/2.5 bath  P2  Hep 3000  TDC  hect 1 ug tiw  # Hemoptysis/ bronchitis. Hx PE/DVT on Coumadin. CTA no infiltrates. Bronchoscopy showed bronchitic changes R side, started on pred 20mg  / d per CCM.  SVC syndrome has been reported to possibly cause hemoptysis due to dilatation of bronchial veins.   Seen by vascular team thought to be pulmonary source.  No hemoptysis today.  # ESRD -HD MWF. 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 ESRD since age 56 in 70.   Status post HD yesterday, tolerated well.  Next HD tomorrow.  She has left [facility] for the access.  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   # Hypertension/volume: BP acceptable.  Off losartan.  UF as tolerated.  # Anemia due to blood loss and ESRD: Hgb stable.  Not on ESA. Follow trends.  #Secondary hyperparathyroidism:Calcium and phosphorus level acceptable.  Continue PhosLo and vitamin D.  Monitor lab.     # Hx PE/DVT - back on coumadin, INR 2.1.  Seen by hematologist, reportedly the plan for IVC filter is deferred.  Patient is likely discharge today.  Discussed with the primary team.  Subjective: Seen and examined at bedside.  Feeling good.  Had dialysis late last night.  Denies nausea vomiting chest pain shortness of breath.  No hemoptysis. Objective Vital signs in last 24 hours: Vitals:   01/02/20 0144 01/02/20 0359 01/02/20 0740 01/02/20 0755  BP: (!)  115/94 106/78 101/75   Pulse: 62 64 67 76  Resp: 13 13 12 14   Temp: 98.2 F (36.8 C) 98.2 F (36.8 C) 98.5 F (36.9 C)   TempSrc: Oral Oral Oral   SpO2: 96% 98% 92% 94%  Weight:  49.8 kg    Height:       Weight change: -0.5 kg  Intake/Output Summary (Last 24 hours) at 01/02/2020 0859 Last data filed at 01/02/2020 0144 Gross per 24 hour  Intake 240 ml  Output --  Net 240 ml       Labs: Basic Metabolic Panel: Recent Labs  Lab 12/28/19 0307 12/29/19 2320 01/01/20 1546  NA 136 133* 126*  K 4.5 5.3* 5.0  CL 101 98 92*  CO2 25 23 19*  GLUCOSE 93 99 130*  BUN 24* 38* 41*  CREATININE 6.98* 10.61* 11.16*  CALCIUM 6.5* 8.2* 8.0*  PHOS 3.6 4.1 3.7   Liver Function Tests: Recent Labs  Lab 12/27/19 1200 12/27/19 1200 12/28/19 0307 12/29/19 2320 01/01/20 1546  AST 24  --  24 26  --   ALT 13  --  13 15  --   ALKPHOS 86  --  78 83  --   BILITOT 0.3  --  0.6 0.6  --   PROT 7.2  --  6.8 6.9  --   ALBUMIN 3.6   < > 3.6 3.7 3.5   < > =  values in this interval not displayed.   No results for input(s): LIPASE, AMYLASE in the last 168 hours. No results for input(s): AMMONIA in the last 168 hours. CBC: Recent Labs  Lab 12/27/19 1200 12/27/19 1200 12/28/19 0307 12/28/19 0307 12/29/19 2320 12/29/19 2320 12/31/19 0507 12/31/19 1925 01/02/20 0236  WBC 3.8*   < > 2.8*   < > 2.8*  --  3.2*  --  3.9*  NEUTROABS  --   --   --   --   --   --  2.3  --   --   HGB 11.2*   < > 11.1*   < > 10.9*   < > 10.7* 11.2* 11.2*  HCT 37.3   < > 36.4   < > 36.4   < > 34.1* 34.5* 35.1*  MCV 97.1  --  95.5  --  96.3  --  94.5  --  90.5  PLT 156   < > 159   < > 148*  --  172  --  146*   < > = values in this interval not displayed.   Cardiac Enzymes: No results for input(s): CKTOTAL, CKMB, CKMBINDEX, TROPONINI in the last 168 hours. CBG: No results for input(s): GLUCAP in the last 168 hours.  Iron Studies: No results for input(s): IRON, TIBC, TRANSFERRIN, FERRITIN in the last 72  hours. Studies/Results: VAS [place] LOWER EXTREMITY VENOUS (DVT)  Result Date: 01/01/2020  Lower Venous DVTStudy Indications: Pain, and Swelling. Other Indications: SVC stent occlusion. Risk Factors: History of PE and DVT X 2 ESRD, on dialysis since the age of 57, retroperitoneal hemorrhage Anticoagulation: Coumadin. Limitations: Bowel gas, pain in the abdomen secondary to intramuscular hematoma involving the psoas muscles. Comparison Study: No prior study on file Performing Technologist: Sharion Dove RVS  Examination Guidelines: A complete evaluation includes B-mode imaging, spectral Doppler, color Doppler, and power Doppler as needed of all accessible portions of each vessel. Bilateral testing is considered an integral part of a complete examination. Limited examinations for reoccurring indications may be performed as noted. The reflux portion of the exam is performed with the patient in reverse Trendelenburg.  +---------+---------------+---------+-----------+----------+--------------+ RIGHT    CompressibilityPhasicitySpontaneityPropertiesThrombus Aging +---------+---------------+---------+-----------+----------+--------------+ CFV      Partial        No       No                   Acute          +---------+---------------+---------+-----------+----------+--------------+ SFJ      Full                                                        +---------+---------------+---------+-----------+----------+--------------+ FV Prox  Partial                                      Acute          +---------+---------------+---------+-----------+----------+--------------+ FV Mid   Partial                                      Acute          +---------+---------------+---------+-----------+----------+--------------+ FV DistalPartial  No       No                   Acute          +---------+---------------+---------+-----------+----------+--------------+ PFV      Full                                                         +---------+---------------+---------+-----------+----------+--------------+ POP      None           No       No                   Acute          +---------+---------------+---------+-----------+----------+--------------+ PTV      None                                         Acute          +---------+---------------+---------+-----------+----------+--------------+ PERO     None                                         Acute          +---------+---------------+---------+-----------+----------+--------------+ Gastroc  None                                         Acute          +---------+---------------+---------+-----------+----------+--------------+ EIV                                                   Not visualized +---------+---------------+---------+-----------+----------+--------------+ CIV                                                   Not visualized +---------+---------------+---------+-----------+----------+--------------+ Patient is extremely tender at groin and into abdomen secondary to psoas hematoma. Unable to visualize external or common iliac veins due to pain and bowel gas.  +----+---------------+---------+-----------+----------+--------------+ LEFTCompressibilityPhasicitySpontaneityPropertiesThrombus Aging +----+---------------+---------+-----------+----------+--------------+ CFV Full           Yes      Yes                                 +----+---------------+---------+-----------+----------+--------------+     Summary: RIGHT: - Findings consistent with acute deep vein thrombosis involving the right common femoral vein, right femoral vein, right popliteal vein, right posterior tibial veins, right peroneal veins, and right gastrocnemius veins.  LEFT: - No evidence of common femoral vein obstruction.  *See table(s) above for measurements and observations. Electronically signed by Monica Martinez MD on  01/01/2020 at 10:52:52 AM.    Final     Medications: Infusions:   Scheduled Medications: . amLODipine  2.5 mg Oral Daily  . calcium acetate  1,334 mg Oral TID WC  . calcium carbonate  1 tablet Oral Daily  . Chlorhexidine Gluconate Cloth  6 each Topical Q0600  . Chlorhexidine Gluconate Cloth  6 each Topical Q0600  . Chlorhexidine Gluconate Cloth  6 each Topical Q0600  . Chlorhexidine Gluconate Cloth  6 each Topical Q0600  . cholecalciferol  2,000 Units Oral Daily  . diphenhydrAMINE      . doxercalciferol      . doxercalciferol  1 mcg Intravenous Q M,W,F-HD  . famotidine  20 mg Oral Daily  . folic acid  1 mg Oral Daily  . HYDROmorphone      . multivitamin  1 tablet Oral QHS  . predniSONE  20 mg Oral Q breakfast  . rosuvastatin  10 mg Oral Daily  . senna-docusate  1 tablet Oral BID  . sodium chloride flush  3 mL Intravenous Q12H  . warfarin  7 mg Oral ONCE-1600  . Warfarin - Pharmacist Dosing Inpatient   Does not apply q1600    have reviewed scheduled and prn medications.  Physical Exam: General:NAD, comfortable Heart:RRR, s1s2 nl Lungs:clear b/l, no crackle Abdomen:soft, Non-tender, non-distended Extremities:No LE edema Dialysis Access: Left IJ TDC.  Kendy Haston Prasad Stewart Pimenta 01/02/2020,8:59 AM  LOS: 8 days  Pager: [phone]

## 2020-01-02 NOTE — Progress Notes (Signed)
IP PROGRESS NOTE  Subjective:   Crystal Dunlap has no new complaint.  The right leg pain has improved.  She does not complain of back pain today.  Objective: Vital signs in last 24 hours: Blood pressure 101/75, pulse 76, temperature 98.5 F (36.9 C), temperature source Oral, resp. rate 14, height 5\' 2"  (1.575 m), weight 109 lb 12.6 oz (49.8 kg), last menstrual period 10/28/2019, SpO2 94 %.  Intake/Output from previous day: 07/05 0701 - 07/06 0700 In: 240 [P.O.:240] Out: -   Physical Exam:   Extremities: Minimal enlargement of the right compared to the left thigh, no tenderness of the right thigh     Lab Results: Recent Labs    12/31/19 0507 12/31/19 0507 12/31/19 1925 01/02/20 0236  WBC 3.2*  --   --  3.9*  HGB 10.7*   < > 11.2* 11.2*  HCT 34.1*   < > 34.5* 35.1*  PLT 172  --   --  146*   < > = values in this interval not displayed.    BMET Recent Labs    01/01/20 1546  NA 126*  K 5.0  CL 92*  CO2 19*  GLUCOSE 130*  BUN 41*  CREATININE 11.16*  CALCIUM 8.0*   PT/INR 2.5  Studies/Results: VAS [place] LOWER EXTREMITY VENOUS (DVT)  Result Date: 01/01/2020  Lower Venous DVTStudy Indications: Pain, and Swelling. Other Indications: SVC stent occlusion. Risk Factors: History of PE and DVT X 2 ESRD, on dialysis since the age of 52, retroperitoneal hemorrhage Anticoagulation: Coumadin. Limitations: Bowel gas, pain in the abdomen secondary to intramuscular hematoma involving the psoas muscles. Comparison Study: No prior study on file Performing Technologist: Sharion Dove RVS  Examination Guidelines: A complete evaluation includes B-mode imaging, spectral Doppler, color Doppler, and power Doppler as needed of all accessible portions of each vessel. Bilateral testing is considered an integral part of a complete examination. Limited examinations for reoccurring indications may be performed as noted. The reflux portion of the exam is performed with the patient in reverse  Trendelenburg.  +---------+---------------+---------+-----------+----------+--------------+ RIGHT    CompressibilityPhasicitySpontaneityPropertiesThrombus Aging +---------+---------------+---------+-----------+----------+--------------+ CFV      Partial        No       No                   Acute          +---------+---------------+---------+-----------+----------+--------------+ SFJ      Full                                                        +---------+---------------+---------+-----------+----------+--------------+ FV Prox  Partial                                      Acute          +---------+---------------+---------+-----------+----------+--------------+ FV Mid   Partial                                      Acute          +---------+---------------+---------+-----------+----------+--------------+ FV DistalPartial        No       No  Acute          +---------+---------------+---------+-----------+----------+--------------+ PFV      Full                                                        +---------+---------------+---------+-----------+----------+--------------+ POP      None           No       No                   Acute          +---------+---------------+---------+-----------+----------+--------------+ PTV      None                                         Acute          +---------+---------------+---------+-----------+----------+--------------+ PERO     None                                         Acute          +---------+---------------+---------+-----------+----------+--------------+ Gastroc  None                                         Acute          +---------+---------------+---------+-----------+----------+--------------+ EIV                                                   Not visualized +---------+---------------+---------+-----------+----------+--------------+ CIV                                                    Not visualized +---------+---------------+---------+-----------+----------+--------------+ Patient is extremely tender at groin and into abdomen secondary to psoas hematoma. Unable to visualize external or common iliac veins due to pain and bowel gas.  +----+---------------+---------+-----------+----------+--------------+ LEFTCompressibilityPhasicitySpontaneityPropertiesThrombus Aging +----+---------------+---------+-----------+----------+--------------+ CFV Full           Yes      Yes                                 +----+---------------+---------+-----------+----------+--------------+     Summary: RIGHT: - Findings consistent with acute deep vein thrombosis involving the right common femoral vein, right femoral vein, right popliteal vein, right posterior tibial veins, right peroneal veins, and right gastrocnemius veins.  LEFT: - No evidence of common femoral vein obstruction.  *See table(s) above for measurements and observations. Electronically signed by Monica Martinez MD on 01/01/2020 at 10:52:52 AM.    Final     Medications: I have reviewed the patient's current medications.  Assessment/Plan: 1. 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  Admission with hemoptysis 12/24/2019-bronchoscopy suggestive of bronchitis, resolved 4.  Retroperitoneal-bilateral psoas hematomas, likely related to heparin anticoagulation (supratherapeutic at times) during this hospital admission 5.  Acute right lower extremity DVT on Doppler 12/31/2019 6.  Pain secondary to retroperitoneal hemorrhage and right leg DVT 7.  SVC stent occlusion 8.  Right lung nodule on chest CT 12/25/2019-to be followed by pulmonary medicine as an outpatient 9.  History of neutropenia-evaluated at [facility]  Crystal Dunlap  appears stable.  The PT/INR is therapeutic.  The hemoglobin is stable.  I recommend continuing Coumadin with a goal PT/INR of 2.5-3. She should have close clinical follow-up with nephrology and her primary provider.  I recommend beginning apixaban anticoagulation if there is progression of the right lower extremity DVT or new venous thrombosis while maintained on Coumadin.  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   LOS: 8 days   Betsy Coder, MD   01/02/2020, 11:53 AM

## 2020-01-02 NOTE — Progress Notes (Signed)
Pt provided with detailed verbal discharge instructions. Paper copy provided to patient. Pt mother at bedside during discharge. RN answered all questions. VSS at discharge. IV removed. Pt belonging taken by mother at discharge. NT d/c pt via wheelchair to [employer] entrance via wheelchair to private vehicle.

## 2020-01-02 NOTE — Progress Notes (Signed)
RN notified by Dr. Tyrell Antonio at approximately 2122 that patient did not have CBC drawn today and needed to have one ordered and its importance that we get this lab drawn. RN informed MD that patient had just got taken to HD and would notify HD RN to draw these labs. This RN put in order for CBC and called HD RN taking care of Crystal Dunlap at approximately 2125 to notify them to draw CBC and let me know of the results. At approximately [place] phlebotomist on the unit called HD and asked if labs were drawn. Phlebotomist was informed by HD RN that she did not draw labs because "no one told her". Phlebotomist will attempt to draw labs once patient is returned to unit. Will continue to monitor.

## 2020-01-03 ENCOUNTER — Telehealth: Payer: Self-pay | Admitting: Nephrology

## 2020-01-03 NOTE — Telephone Encounter (Signed)
Transition of care contact from inpatient facility  Date of Discharge: 01/02/20 Date of Contact: 01/03/20 -attempt  Method of contact: Phone  Attempted to contact patient to discuss transition of care from inpatient admission. Patient did not answer the phone. Message was left on the patient's voicemail with call back number [phone].

## 2020-01-04 ENCOUNTER — Telehealth: Payer: Self-pay | Admitting: Nephrology

## 2020-01-04 NOTE — Telephone Encounter (Signed)
Transition of care contact from inpatient facility  Date of Discharge: 01/02/20 Date of Contact: 01/04/20 - 2nd attempt  Method of contact: Phone  Attempted to contact patient to discuss transition of care from inpatient admission. Patient did not answer the phone. Message was left on the patient's voicemail with call back number [phone].

## 2020-01-05 NOTE — Progress Notes (Signed)
[facility] Progress Note  Subjective: seen in room w/ the attending and discussed new dx of RLE DVT in setting of bilat psoas bleed, IV heparin / coumadin.  Pt's R leg pain improving. Mother gives hx of prior LUE "blood clot that went to the lungs" during a hospital stay in [place] around 2015- 17.  It was not associated w/ any HD access/ cath / AVF or AVG. She never has had surgery on her UE's for perm HD access per the mother.   Vitals:   01/02/20 0144 01/02/20 0359 01/02/20 0740 01/02/20 0755  BP: (!) 115/94 106/78 101/75   Pulse: 62 64 67 76  Resp: 13 13 12 14   Temp: 98.2 F (36.8 C) 98.2 F (36.8 C) 98.5 F (36.9 C)   TempSrc: Oral Oral Oral   SpO2: 96% 98% 92% 94%  Weight:  49.8 kg    Height:        Exam:  alert, nad   no jvd  Chest cta bilat  Cor reg no RG  Abd soft ntnd no ascites   Ext no LE edema, R groin area nontender, no mass or erythema   Alert, NF, ox3   L IJ TDC    OP HD: [facility] MWF   3.5h  350/800  47kg  2/2.5 bath  P2  Hep 3000  TDC  hect 1 ug tiw    Assessment/Plan: 1. RLE DVT - per primary, consulting Hematology 2. Bilat psoas hematoma - per primary 3. Hemoptysis/ bronchitis. Hx PE/DVT on Coumadin. CTA no infiltrates. Bronchoscopy showed bronchitic changes R side, started on pred 20mg  / d per CCM.  SVC syndrome has been reported to possibly cause hemoptysis due to dilatation of bronchial veins.  4. ESRD -  HD MWF. 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 ESRD since age 89 in 70. HD Monday 5. Vasc access: seen by Dr Oneida Alar in 2016 and RUA AVG was suggested but pt refused permanent access at that time and appears she has been cath-dependent since.  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 Not sure where SVC stent was placed.  7. Hypertension/volume  - BP's  soft to low-normal here, dc'd  losartan. If restarting give lower dose of 25- 50mg  / d.  8. Anemia  - Hgb 11.9 Not on ESA. Follow trends.  9. Metabolic bone disease -  Ca low here 8.1 > 6.5, was given IV/po Ca and Ca++ improved to > 8 on 7.2. Switched renvela to phoslo (was taking both at home per records?).  10. Hx PE/DVT - back on coumadin, INR pending      Crystal Dunlap 12/31/2019, 9:04 AM   Recent Labs  Lab 12/29/19 2320 12/31/19 0507 12/31/19 1925 01/01/20 1546 01/02/20 0236  K 5.3*  --   --  5.0  --   BUN 38*  --   --  41*  --   CREATININE 10.61*  --   --  11.16*  --   CALCIUM 8.2*  --   --  8.0*  --   PHOS 4.1  --   --  3.7  --   HGB 10.9*   < > 11.2*  --  11.2*   < > = values in this interval not displayed.   Inpatient medications:

## 2020-01-24 LAB — FUNGUS CULTURE WITH STAIN

## 2020-01-24 LAB — FUNGUS CULTURE RESULT

## 2020-01-24 LAB — FUNGAL ORGANISM REFLEX

## 2020-02-26 IMAGING — CR DG CHEST 2V
2 series · 2 of 2 positions shown · non-contrast
Comparison: Radiographs and CT 12/24/2017.

CLINICAL DATA: Left-sided chest pain since yesterday with cough.
History of diabetes and failed renal transplant on hemodialysis.
History of SVC stenosis/occlusion.

EXAM:
CHEST - 2 VIEW

[w chest pa]
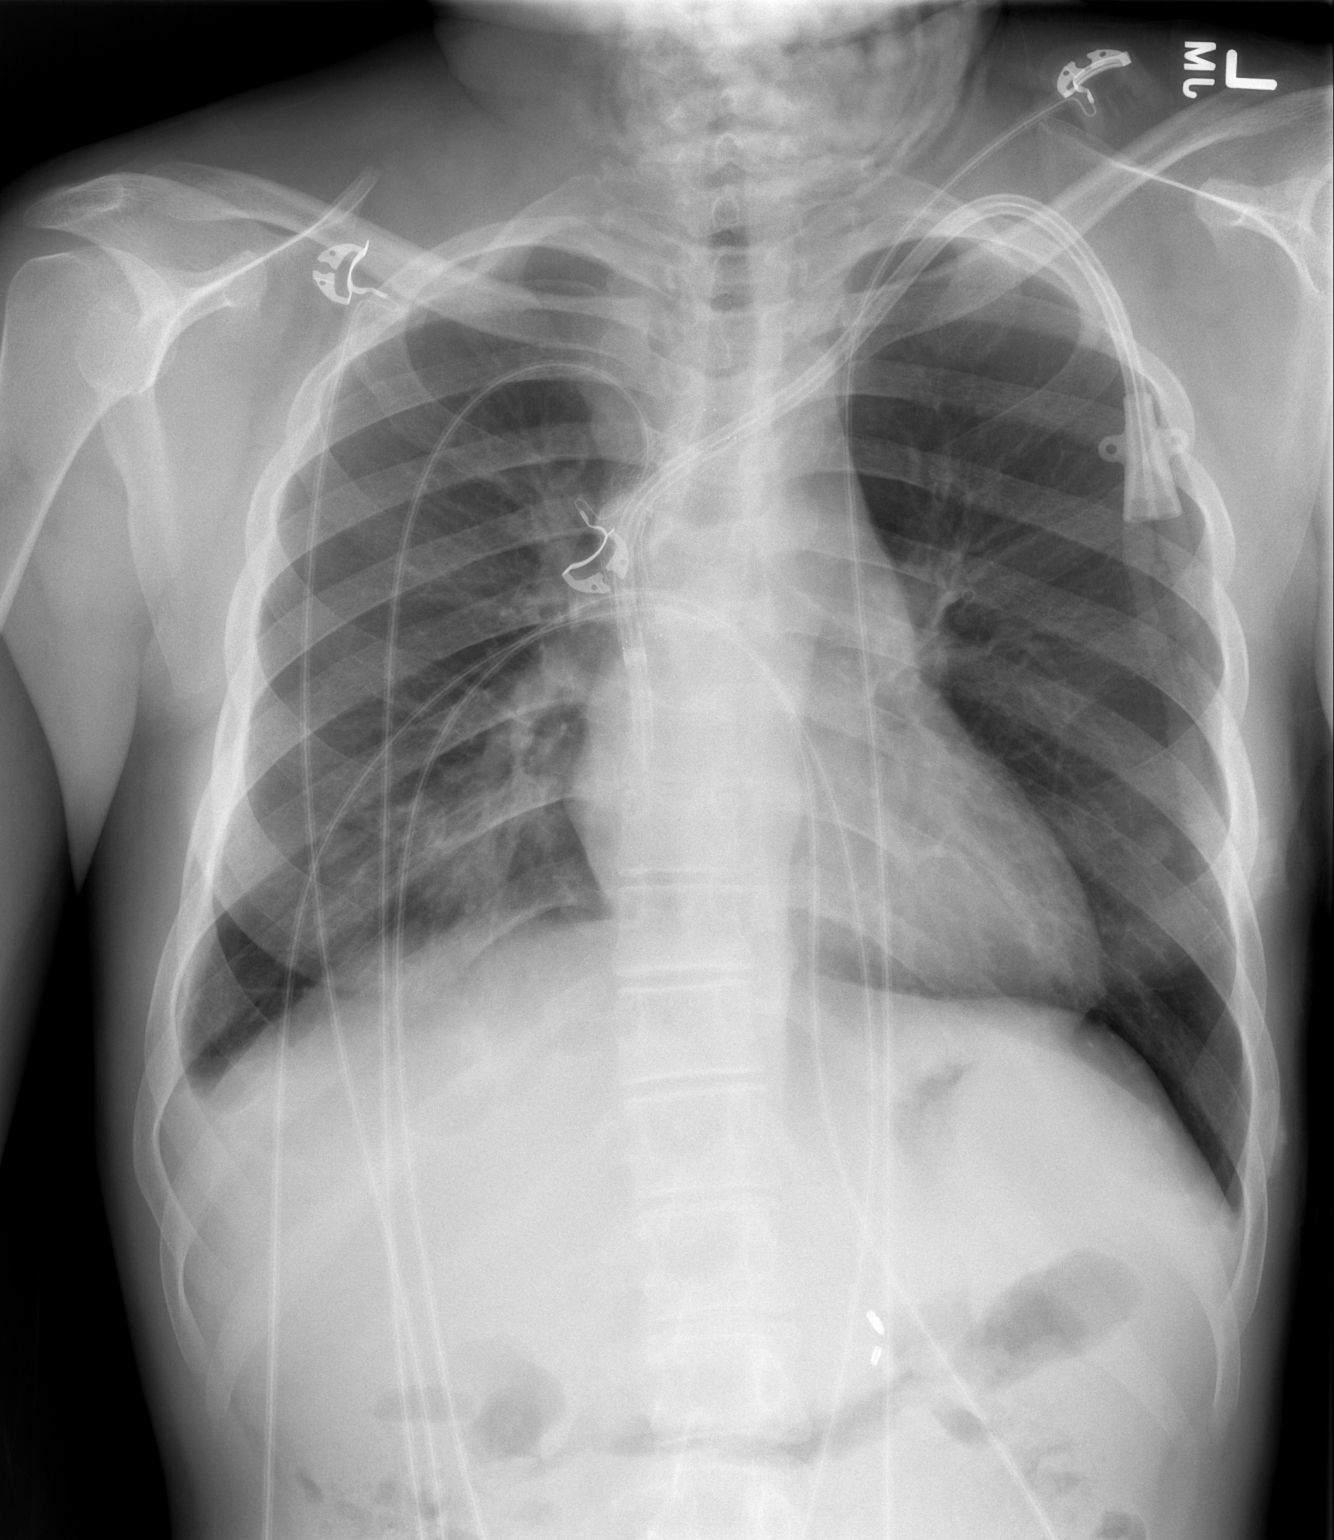

[w chest lat]
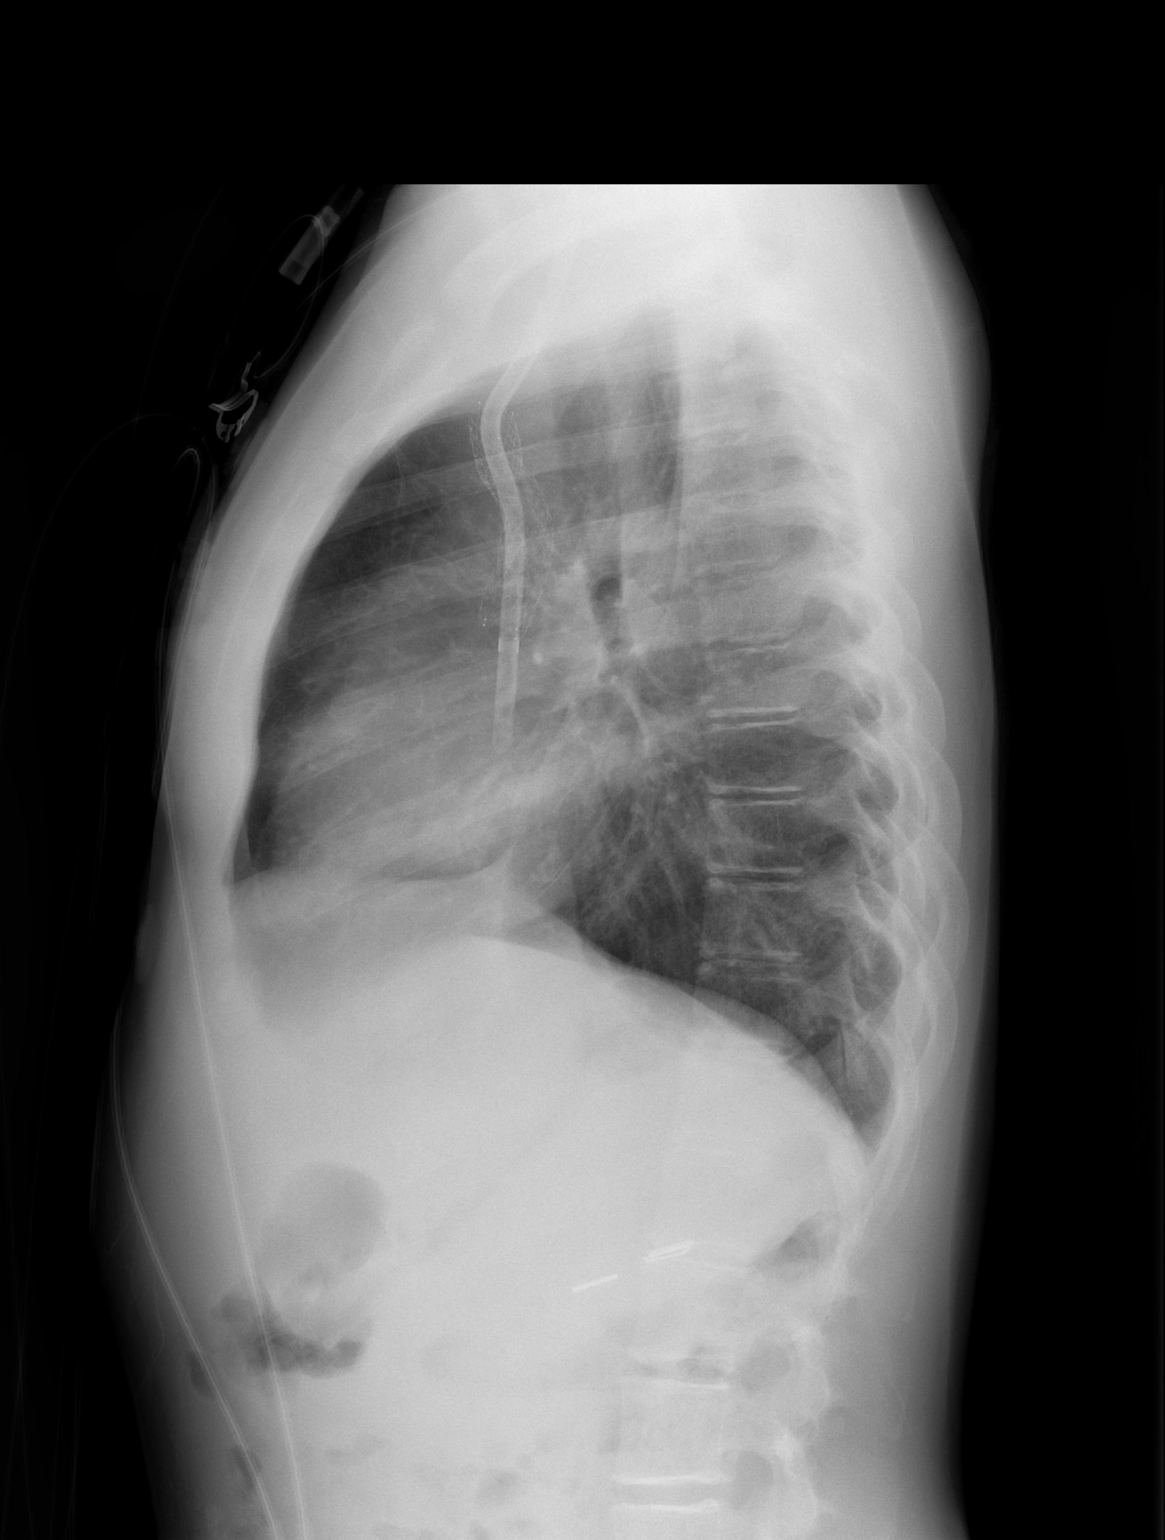

[2 of 2 positions shown; findings below may reference images not displayed]

FINDINGS: Left IJ hemodialysis catheter appears unchanged, tip at the level of
the superior cavoatrial junction. There is a stent within the left
brachiocephalic vein and upper SVC. The heart size is normal. There
is patchy airspace opacity along the right heart border on the
frontal examination, not well seen on the lateral view, but probably
in the middle lobe. The left lung is clear. There is no pleural
effusion, pneumothorax or edema. The bones appear unremarkable.
IMPRESSION: Patchy right middle lobe atelectasis or early infiltrate.  No edema.
